# Patient Record
Sex: Male | Born: 1960 | Race: Black or African American | Hispanic: No | Marital: Single | Smoking: Former smoker
Health system: Southern US, Community
[De-identification: ages and names within clinical notes are randomized; demographics above are authoritative.]

## PROBLEM LIST (undated history)

## (undated) DIAGNOSIS — I429 Cardiomyopathy, unspecified: Secondary | ICD-10-CM

## (undated) DIAGNOSIS — I10 Essential (primary) hypertension: Secondary | ICD-10-CM

## (undated) DIAGNOSIS — F1490 Cocaine use, unspecified, uncomplicated: Secondary | ICD-10-CM

## (undated) DIAGNOSIS — N1832 Chronic kidney disease, stage 3b: Secondary | ICD-10-CM

## (undated) DIAGNOSIS — Z720 Tobacco use: Secondary | ICD-10-CM

## (undated) HISTORY — PX: BACK SURGERY: SHX140

---

## 2000-01-29 ENCOUNTER — Encounter: Payer: Self-pay | Admitting: General Surgery

## 2000-01-29 ENCOUNTER — Inpatient Hospital Stay (HOSPITAL_COMMUNITY): Admission: EM | Admit: 2000-01-29 | Discharge: 2000-01-31 | Payer: Self-pay | Admitting: Surgery

## 2005-04-24 ENCOUNTER — Emergency Department (HOSPITAL_COMMUNITY): Admission: EM | Admit: 2005-04-24 | Discharge: 2005-04-24 | Payer: Self-pay | Admitting: Emergency Medicine

## 2005-11-21 ENCOUNTER — Emergency Department (HOSPITAL_COMMUNITY): Admission: EM | Admit: 2005-11-21 | Discharge: 2005-11-22 | Payer: Self-pay | Admitting: Emergency Medicine

## 2006-10-02 ENCOUNTER — Emergency Department (HOSPITAL_COMMUNITY): Admission: EM | Admit: 2006-10-02 | Discharge: 2006-10-02 | Payer: Self-pay | Admitting: Emergency Medicine

## 2007-05-14 IMAGING — CR DG CHEST 2V
2 series · 2 of 2 positions shown · non-contrast
Comparison: none

CLINICAL DATA: Vomiting, cough, sore throat.  
 CHEST - 2 VIEW:

[view not recorded (1 of 2)]
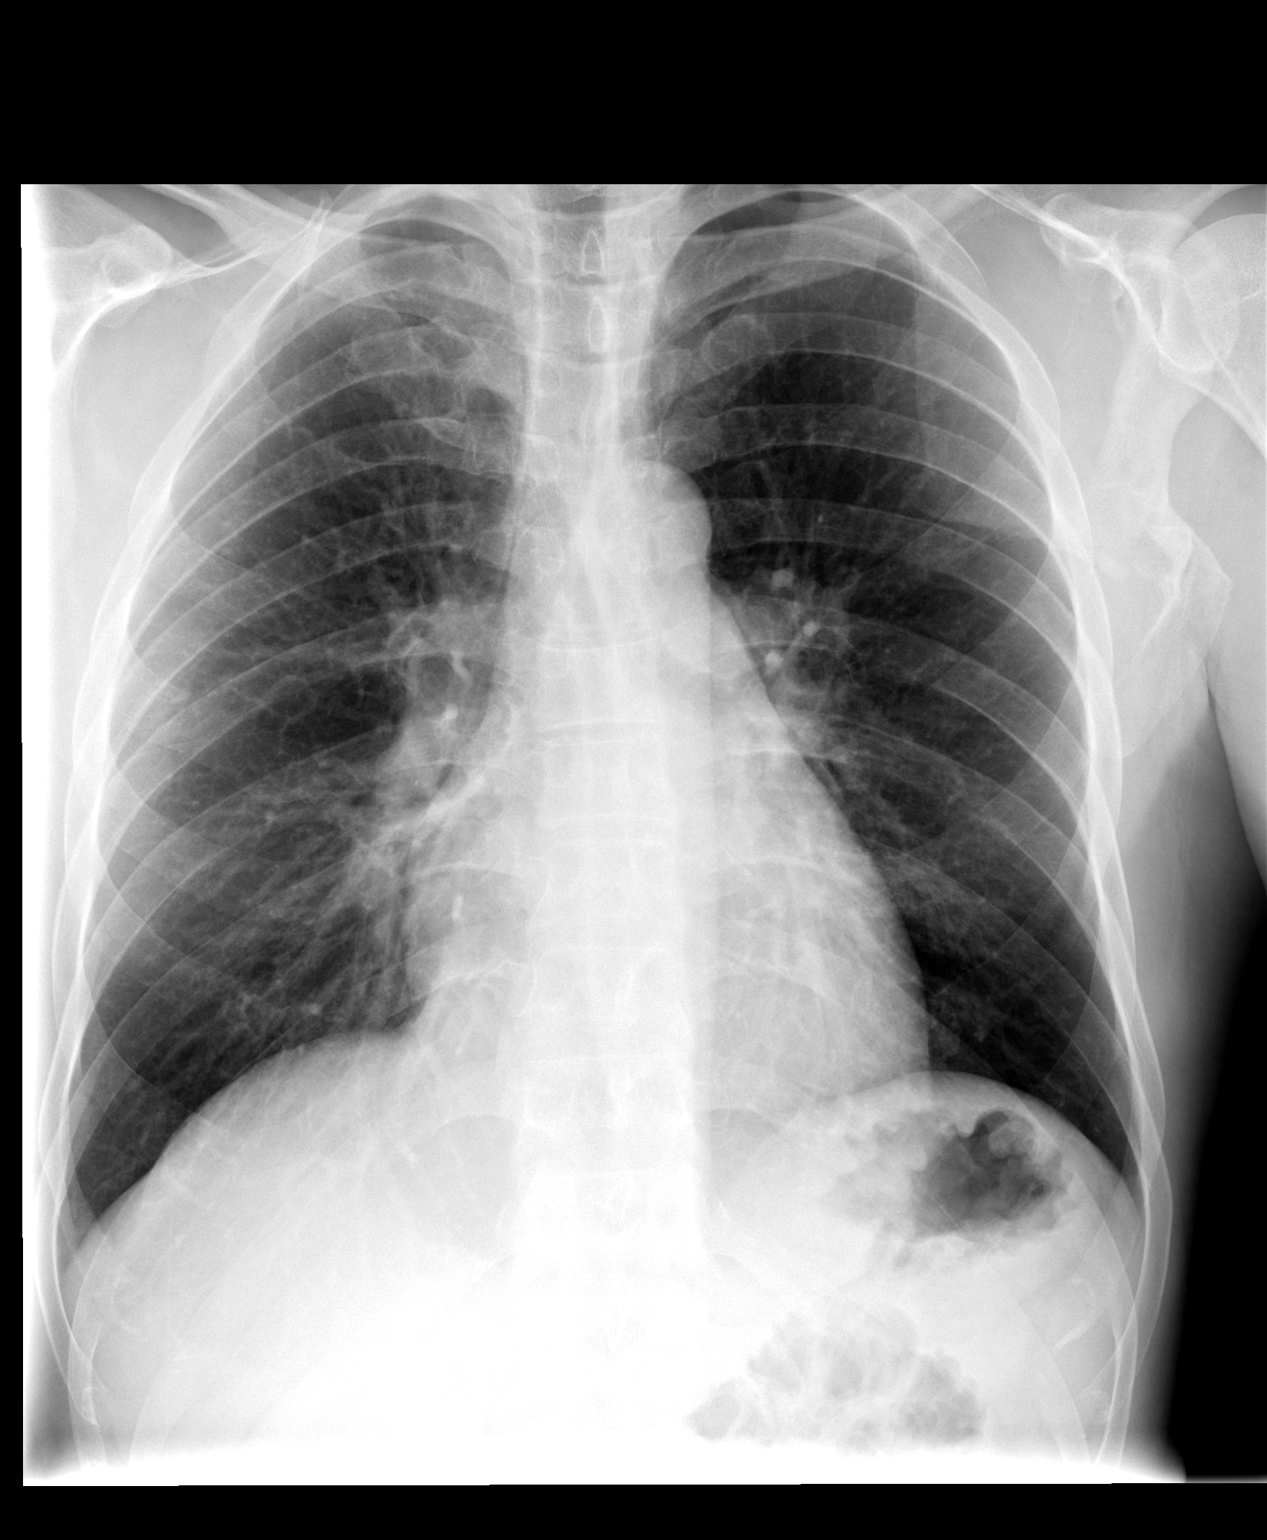

[view not recorded (2 of 2)]
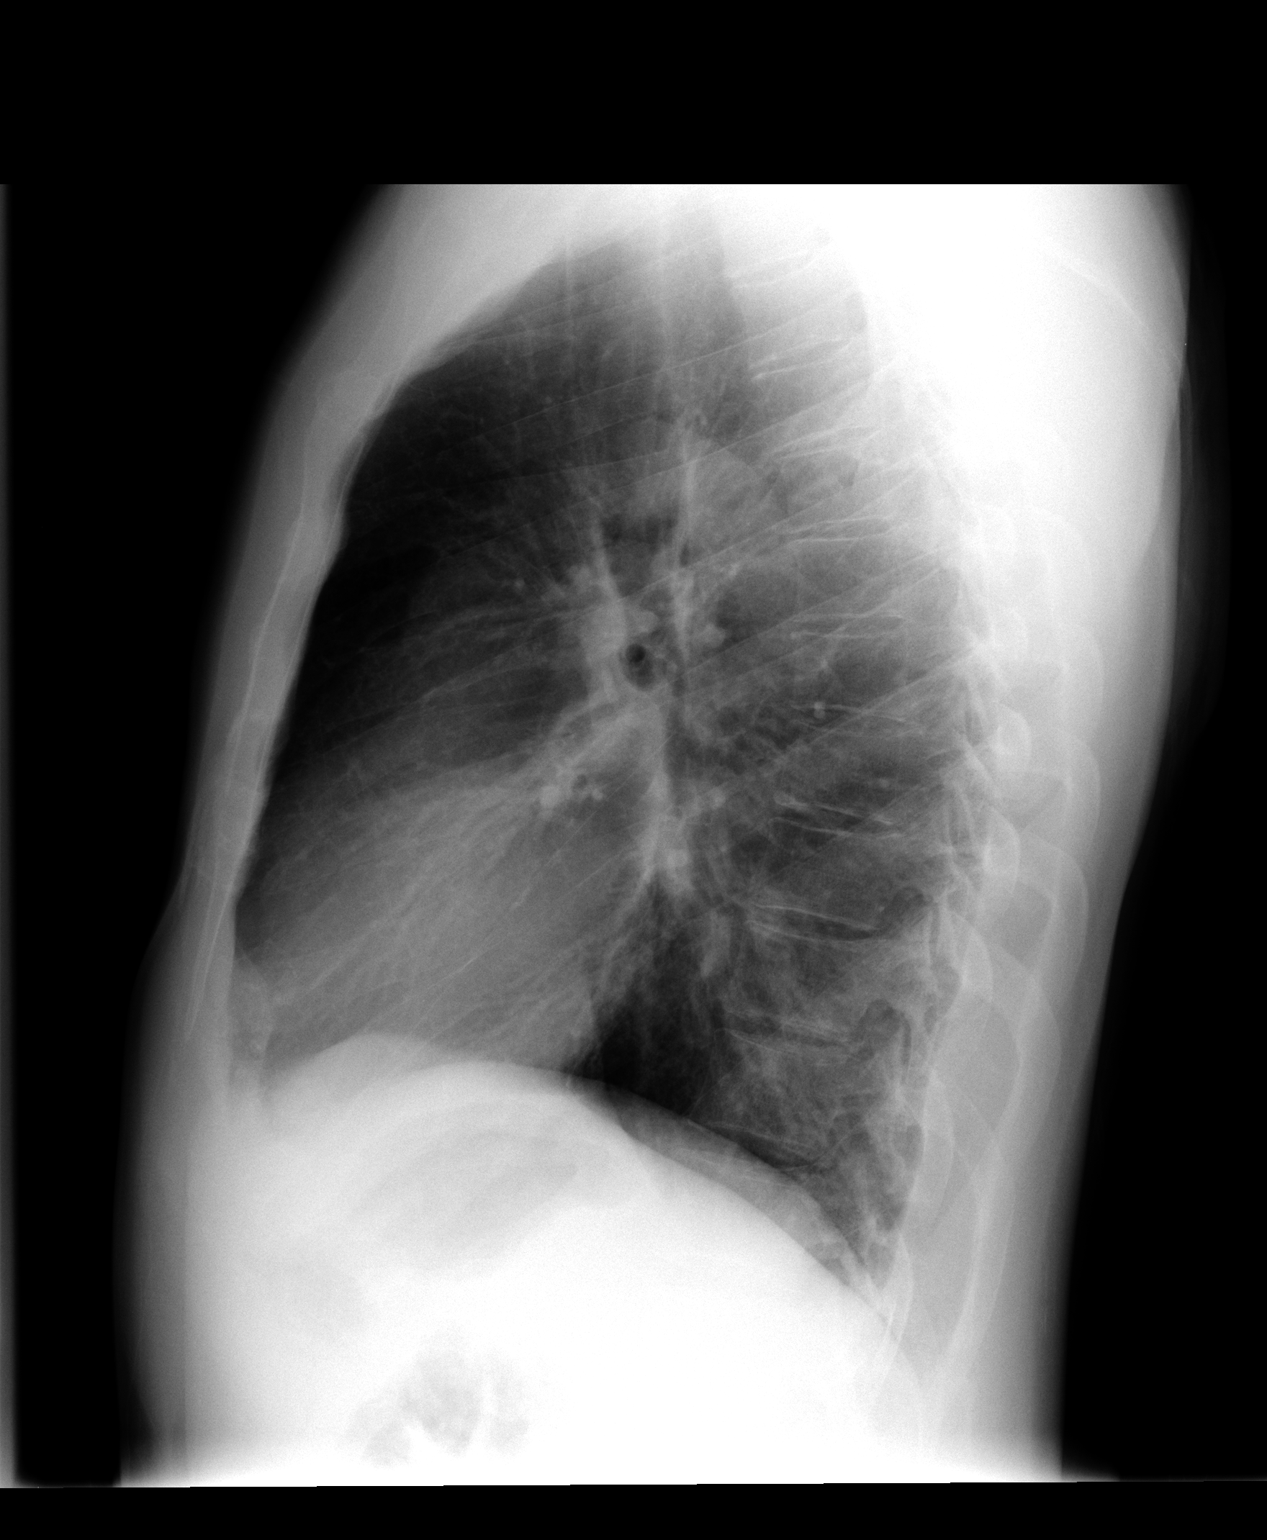

[2 of 2 positions shown; findings below may reference images not displayed]

FINDINGS: The heart size and mediastinal contours are within normal limits.  Both lungs are clear.  The visualized skeletal structures are unremarkable.
IMPRESSION: No active cardiopulmonary disease.

## 2009-06-14 ENCOUNTER — Emergency Department (HOSPITAL_COMMUNITY): Admission: EM | Admit: 2009-06-14 | Discharge: 2009-06-14 | Payer: Self-pay | Admitting: Emergency Medicine
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
## 2011-04-11 ENCOUNTER — Emergency Department (HOSPITAL_COMMUNITY): Payer: No Typology Code available for payment source

## 2011-04-11 ENCOUNTER — Encounter: Payer: Self-pay | Admitting: Emergency Medicine

## 2011-04-11 ENCOUNTER — Emergency Department (HOSPITAL_COMMUNITY)
Admission: EM | Admit: 2011-04-11 | Discharge: 2011-04-11 | Disposition: A | Discharge status: Home / Self Care | Payer: No Typology Code available for payment source | Attending: Emergency Medicine | Admitting: Emergency Medicine

## 2011-04-11 DIAGNOSIS — S63502A Unspecified sprain of left wrist, initial encounter: Secondary | ICD-10-CM

## 2011-04-11 DIAGNOSIS — S63509A Unspecified sprain of unspecified wrist, initial encounter: Secondary | ICD-10-CM | POA: Insufficient documentation

## 2011-04-11 DIAGNOSIS — M25539 Pain in unspecified wrist: Secondary | ICD-10-CM | POA: Insufficient documentation

## 2011-04-11 DIAGNOSIS — F17200 Nicotine dependence, unspecified, uncomplicated: Secondary | ICD-10-CM | POA: Insufficient documentation

## 2011-04-11 DIAGNOSIS — M545 Low back pain, unspecified: Secondary | ICD-10-CM | POA: Insufficient documentation

## 2011-04-11 DIAGNOSIS — S335XXA Sprain of ligaments of lumbar spine, initial encounter: Secondary | ICD-10-CM | POA: Insufficient documentation

## 2011-04-11 DIAGNOSIS — S39012A Strain of muscle, fascia and tendon of lower back, initial encounter: Secondary | ICD-10-CM

## 2011-04-11 MED ORDER — IBUPROFEN 800 MG PO TABS
800.0000 mg | ORAL_TABLET | Freq: Once | ORAL | Status: AC
  Administered 2011-04-11: 800 mg via ORAL
  Filled 2011-04-11: qty 1

## 2011-04-11 MED ORDER — HYDROCODONE-ACETAMINOPHEN 5-325 MG PO TABS: 2.0000 | ORAL_TABLET | ORAL | Status: AC | PRN

## 2011-04-11 MED ORDER — HYDROCODONE-ACETAMINOPHEN 5-325 MG PO TABS
2.0000 | ORAL_TABLET | Freq: Once | ORAL | Status: AC
  Administered 2011-04-11: 2 via ORAL
  Filled 2011-04-11: qty 2

## 2011-04-11 MED ORDER — DIAZEPAM 5 MG PO TABS: 5.0000 mg | ORAL_TABLET | Freq: Four times a day (QID) | ORAL | Status: AC | PRN

## 2011-04-11 MED ORDER — IBUPROFEN 800 MG PO TABS: 800.0000 mg | ORAL_TABLET | Freq: Once | ORAL | Status: AC

## 2011-04-11 MED ORDER — DIAZEPAM 5 MG PO TABS
5.0000 mg | ORAL_TABLET | Freq: Once | ORAL | Status: AC
  Administered 2011-04-11: 5 mg via ORAL
  Filled 2011-04-11: qty 1
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
## 2015-02-05 ENCOUNTER — Emergency Department (HOSPITAL_COMMUNITY): Payer: Self-pay

## 2015-02-05 ENCOUNTER — Emergency Department (HOSPITAL_COMMUNITY)
Admission: EM | Admit: 2015-02-05 | Discharge: 2015-02-05 | Disposition: A | Discharge status: Home / Self Care | Payer: Self-pay | Attending: Emergency Medicine | Admitting: Emergency Medicine

## 2015-02-05 ENCOUNTER — Encounter (HOSPITAL_COMMUNITY): Payer: Self-pay | Admitting: Emergency Medicine

## 2015-02-05 DIAGNOSIS — S6010XA Contusion of unspecified finger with damage to nail, initial encounter: Secondary | ICD-10-CM

## 2015-02-05 DIAGNOSIS — S60122A Contusion of left index finger with damage to nail, initial encounter: Secondary | ICD-10-CM | POA: Insufficient documentation

## 2015-02-05 DIAGNOSIS — Y990 Civilian activity done for income or pay: Secondary | ICD-10-CM | POA: Insufficient documentation

## 2015-02-05 DIAGNOSIS — Y9289 Other specified places as the place of occurrence of the external cause: Secondary | ICD-10-CM | POA: Insufficient documentation

## 2015-02-05 DIAGNOSIS — W231XXA Caught, crushed, jammed, or pinched between stationary objects, initial encounter: Secondary | ICD-10-CM | POA: Insufficient documentation

## 2015-02-05 DIAGNOSIS — Y9389 Activity, other specified: Secondary | ICD-10-CM | POA: Insufficient documentation

## 2015-02-05 DIAGNOSIS — Z720 Tobacco use: Secondary | ICD-10-CM | POA: Insufficient documentation

## 2015-02-05 MED ORDER — HYDROCODONE-ACETAMINOPHEN 5-325 MG PO TABS
1.0000 | ORAL_TABLET | Freq: Once | ORAL | Status: AC
  Administered 2015-02-05: 1 via ORAL
  Filled 2015-02-05: qty 1

## 2015-02-05 MED ORDER — HYDROCODONE-ACETAMINOPHEN 5-325 MG PO TABS: 1.0000 | ORAL_TABLET | ORAL | Status: DC | PRN

## 2015-02-05 NOTE — Discharge Instructions (Signed)
Subungual Hematoma °A subungual hematoma is a pocket of blood that collects under the fingernail or toenail. The pressure created by the blood under the nail can cause pain. °CAUSES  °A subungual hematoma occurs when an injury to the finger or toe causes a blood vessel beneath the nail to break. The injury can occur from a direct blow such as slamming a finger in a door. It can also occur from a repeated injury such as pressure on the foot in a shoe while running. A subungual hematoma is sometimes called runner's toe or tennis toe. °SYMPTOMS  °· Blue or dark blue skin under the nail. °· Pain or throbbing in the injured area. °DIAGNOSIS  °Your caregiver can determine whether you have a subungual hematoma based on your history and a physical exam. If your caregiver thinks you might have a broken (fractured) bone, X-rays may be taken. °TREATMENT  °Hematomas usually go away on their own over time. Your caregiver may make a hole in the nail to drain the blood. Draining the blood is painless and usually provides significant relief from pain and throbbing. The nail usually grows back normally after this procedure. In some cases, the nail may need to be removed. This is done if there is a cut under the nail that requires stitches (sutures). °HOME CARE INSTRUCTIONS  °· Put ice on the injured area. °¨ Put ice in a plastic bag. °¨ Place a towel between your skin and the bag. °¨ Leave the ice on for 15-20 minutes, 03-04 times a day for the first 1 to 2 days. °· Elevate the injured area to help decrease pain and swelling. °· If you were given a bandage, wear it for as long as directed by your caregiver. °· If part of your nail falls off, trim the remaining nail gently. This prevents the nail from catching on something and causing further injury. °· Only take over-the-counter or prescription medicines for pain, discomfort, or fever as directed by your caregiver. °SEEK IMMEDIATE MEDICAL CARE IF:  °· You have redness or swelling  around the nail. °· You have yellowish-white fluid (pus) coming from the nail. °· Your pain is not controlled with medicine. °· You have a fever. °MAKE SURE YOU: °· Understand these instructions. °· Will watch your condition. °· Will get help right away if you are not doing well or get worse. °Document Released: 06/24/2000 Document Revised: 09/19/2011 Document Reviewed: 06/15/2011 °Exit[employer]® Patient Information ©2015 [employer]. This information is not intended to replace advice given to you by your health care provider. Make sure you discuss any questions you have with your health care provider. ° °

## 2015-02-05 NOTE — ED Notes (Signed)
Crush left index finger at work on Tuesday.  Rates pain 8/10.

## 2015-02-07 NOTE — ED Provider Notes (Signed)
CSN: [id number]     Arrival date & time 02/05/15  1041 History   First MD Initiated Contact with Patient 02/05/15 1046     Chief Complaint  Patient presents with  . Finger Injury     (Consider location/radiation/quality/duration/timing/severity/associated sxs/prior Treatment) The history is provided by the patient.   Damon Williams is a 54 y.o. right handed male presenting with a crush injury to his left index finger 2 days ago when he caught it between 2 pieces of metal at work.  He reports increasing pain, deep, throbbing and persistent even though he has been using ice and elevation and delayed presenting here in the hopes it would improve.  He denies numbness in the distal finger.  He reports limited flexion and extension secondary to pain. He has had no medicines for pain relief.     History reviewed. No pertinent past medical history. Past Surgical History  Procedure Laterality Date  . Back surgery     History reviewed. No pertinent family history. History  Substance Use Topics  . Smoking status: Current Every Day Smoker -- 1.00 packs/day for 30 years    Types: Cigarettes  . Smokeless tobacco: Never Used  . Alcohol Use: No    Review of Systems  Constitutional: Negative for fever.  Musculoskeletal: Positive for joint swelling and arthralgias. Negative for myalgias.  Skin: Negative for wound.  Neurological: Negative for weakness and numbness.      Allergies  Review of patient's allergies indicates no known allergies.  Home Medications   Prior to Admission medications   Medication Sig Start Date End Date Taking? Authorizing Provider  HYDROcodone-acetaminophen (NORCO/VICODIN) 5-325 MG per tablet Take 1 tablet by mouth every 4 (four) hours as needed. 02/05/15   Burgess Amor, PA-C   BP 148/95 mmHg  Pulse 89  Temp(Src) 98 F (36.7 C) (Oral)  Resp 16  Ht  (1.727 m)  Wt 155 lb (70.308 kg)  BMI 23.57 kg/m2  SpO2 100% Physical Exam  Constitutional: He appears  well-developed and well-nourished.  HENT:  Head: Atraumatic.  Neck: Normal range of motion.  Cardiovascular:  Pulses equal bilaterally  Musculoskeletal: He exhibits tenderness.       Hands: Neurological: He is alert. He has normal strength. He displays normal reflexes. No sensory deficit.  Skin: Skin is warm and dry.  Psychiatric: He has a normal mood and affect.    ED Course  Procedures (including critical care time)   Pt's subungual hematoma was evacuated using an eye cautery stick to trephinate the nail after using alcohol swabs to clean the nail surface.  Moderate amount of blood drained from the hole with significant relief of pain for the patient.   Labs Review Labs Reviewed - No data to display  Imaging Review No results found.   EKG Interpretation None      MDM   Final diagnoses:  Subungual hematoma of digit of hand, initial encounter    Patients labs and/or radiological studies were reviewed and considered during the medical decision making and disposition process.   Imaging was reviewed, interpreted and I agree with radiologists reading.  Results were also discussed with patient. Pt was encouraged to soak his finger twice daily, wash with soapy water, then dry and bandage.  He was prescribed hydrocodone.  Advised pt that he may loose the nail as it continues to grow out but it is currently intact, expect the new nail to grow without deformity. No evidence today of permanent nail bed  injury. Advised recheck for any problems with this injury.  The patient appears reasonably screened and/or stabilized for discharge and I doubt any other medical condition or other [facility] requiring further screening, evaluation, or treatment in the ED at this time prior to discharge.      Burgess Amor, PA-C 02/07/15 2202  Rolland Porter, MD 02/13/15 [phone]

## 2018-01-01 ENCOUNTER — Emergency Department (HOSPITAL_COMMUNITY): Payer: Self-pay

## 2018-01-01 ENCOUNTER — Emergency Department (HOSPITAL_COMMUNITY)
Admission: EM | Admit: 2018-01-01 | Discharge: 2018-01-01 | Discharge status: Against Medical Advice | Payer: Self-pay | Attending: Emergency Medicine | Admitting: Emergency Medicine

## 2018-01-01 ENCOUNTER — Encounter (HOSPITAL_COMMUNITY): Payer: Self-pay

## 2018-01-01 DIAGNOSIS — Y998 Other external cause status: Secondary | ICD-10-CM | POA: Insufficient documentation

## 2018-01-01 DIAGNOSIS — S51812A Laceration without foreign body of left forearm, initial encounter: Secondary | ICD-10-CM

## 2018-01-01 DIAGNOSIS — W298XXA Contact with other powered powered hand tools and household machinery, initial encounter: Secondary | ICD-10-CM | POA: Insufficient documentation

## 2018-01-01 DIAGNOSIS — Y9389 Activity, other specified: Secondary | ICD-10-CM | POA: Insufficient documentation

## 2018-01-01 DIAGNOSIS — F17210 Nicotine dependence, cigarettes, uncomplicated: Secondary | ICD-10-CM | POA: Insufficient documentation

## 2018-01-01 DIAGNOSIS — S52602B Unspecified fracture of lower end of left ulna, initial encounter for open fracture type I or II: Secondary | ICD-10-CM | POA: Insufficient documentation

## 2018-01-01 DIAGNOSIS — Y929 Unspecified place or not applicable: Secondary | ICD-10-CM | POA: Insufficient documentation

## 2018-01-01 MED ORDER — POVIDONE-IODINE 10 % EX SOLN
CUTANEOUS | Status: DC | PRN
  Administered 2018-01-01: 1 via TOPICAL

## 2018-01-01 MED ORDER — POVIDONE-IODINE 10 % EX SOLN
CUTANEOUS | Status: AC
Start: 2018-01-01 — End: 2018-01-01
  Administered 2018-01-01: 1 via TOPICAL
  Filled 2018-01-01: qty 15

## 2018-01-01 MED ORDER — CEPHALEXIN 500 MG PO CAPS: 500.0000 mg | ORAL_CAPSULE | Freq: Once | ORAL | Status: DC

## 2018-01-01 MED ORDER — CEPHALEXIN 500 MG PO CAPS: 500.0000 mg | ORAL_CAPSULE | Freq: Four times a day (QID) | ORAL | 0 refills | Status: DC

## 2018-01-01 MED ORDER — LIDOCAINE-EPINEPHRINE-TETRACAINE (LET) SOLUTION
6.0000 mL | Freq: Once | NASAL | Status: AC
  Administered 2018-01-01: 6 mL via TOPICAL
  Filled 2018-01-01: qty 6

## 2018-01-01 NOTE — ED Triage Notes (Signed)
Pt reports left arm cut by grinder today. Pain with moving wrist. Bleeding controlled

## 2018-01-01 NOTE — ED Notes (Signed)
Pt waiting in hall, asked pt to wait in room.  Pt did return to room and then left walking out of dept, talking to self "I done told you that I'm not staying in that room"  Burgess AmorJulie Idol, PA at nurse desk and witnessed event.

## 2018-01-01 NOTE — ED Notes (Signed)
Pt refused any sticks involving a needle, tried LET, pt states it has not helped, pt then came out of room to inform nurse and PA that he was wanting to leave, Burgess AmorJulie Idol went in and was able to place steri strips to lac.  This nurse applied dsg to site and wrist/forearm splint to left arm.

## 2018-01-01 NOTE — ED Provider Notes (Signed)
[facility] EMERGENCY DEPARTMENT Provider Note   CSN: [id number] Arrival date & time: 01/01/18  1334     History   Chief Complaint Chief Complaint  Patient presents with  . Extremity Laceration    HPI Damon Williams is a 57 y.o. right handed male presenting with laceration occurring 2 hours before arrival to his left lateral mid forearm while he was trying to cut plywood using a grinder tool.  He reports pain with wrist movement which is better at rest.  He denies numbness or weakness in his fingers, hand or wrist.  He has had no treatment prior to arrival except for direct pressure to control bleeding.  He is unsure of the date of his last tetanus shot.  The history is provided by the patient.    History reviewed. No pertinent past medical history.  There are no active problems to display for this patient.   Past Surgical History:  Procedure Laterality Date  . BACK SURGERY          Home Medications    Prior to Admission medications   Medication Sig Start Date End Date Taking? Authorizing Provider  cephALEXin (KEFLEX) 500 MG capsule Take 1 capsule (500 mg total) by mouth 4 (four) times daily. 01/01/18   Burgess Amor, PA-C  HYDROcodone-acetaminophen (NORCO/VICODIN) 5-325 MG per tablet Take 1 tablet by mouth every 4 (four) hours as needed. 02/05/15   Burgess Amor, PA-C    Family History No family history on file.  Social History Social History   Tobacco Use  . Smoking status: Current Every Day Smoker    Packs/day: 1.00    Years: 30.00    Pack years: 30.00    Types: Cigarettes  . Smokeless tobacco: Never Used  Substance Use Topics  . Alcohol use: No  . Drug use: No     Allergies   Patient has no known allergies.   Review of Systems Review of Systems  Constitutional: Negative.  Negative for fever.  Gastrointestinal: Negative.   Musculoskeletal: Positive for arthralgias. Negative for joint swelling and myalgias.  Skin: Positive for wound.  Neurological:  Negative for weakness and numbness.     Physical Exam Updated Vital Signs BP (!) 159/103 (BP Location: Right Arm)   Pulse 97   Temp 98.6 F (37 C) (Oral)   Resp 16   Wt 70.3 kg (155 lb)   SpO2 99%   BMI 23.57 kg/m   Physical Exam  Constitutional: He is oriented to person, place, and time. He appears well-developed and well-nourished.  HENT:  Head: Normocephalic.  Cardiovascular: Normal rate.  Pulmonary/Chest: Effort normal.  Musculoskeletal: Normal range of motion. He exhibits tenderness. He exhibits no deformity.  FROM of left fingers and wrist but with increased pain with wrist dorsiflexion and inversion.   Neurological: He is alert and oriented to person, place, and time. No sensory deficit.  Skin: Laceration noted.  6 cm subcutaneous laceration, irregular, dorsal lateral left forearm. Tendon edge visualized, suspect partial but not full injury, no palpable foreign body. Distal sensation intact with less than 2 sec cap refill in fingertips.  Nursing note and vitals reviewed.    ED Treatments / Results  Labs (all labs ordered are listed, but only abnormal results are displayed) Labs Reviewed - No data to display  EKG None  Radiology Dg Wrist Complete Left  Result Date: 01/01/2018 CLINICAL DATA:  Laceration. EXAM: LEFT WRIST - COMPLETE 3+ VIEW COMPARISON:  None. FINDINGS: There is a laceration along the  ulnar aspect of the distal forearm. There is a defect in the adjacent ulna consistent with fracture. High attenuation material in the adjacent soft tissues is at least partially explained by fracture fragments but it would be difficult to exclude foreign bodies as well. No dislocation. No other fractures noted. No other acute abnormalities. IMPRESSION: 1. There is a laceration to the distal forearm adjacent to the ulna. There is deformity of the underlying ulna consistent with fracture with adjacent fracture fragments. There is high attenuation in the adjacent soft tissues  which is at least partially explained by fracture fragments but it would be difficult to exclude foreign bodies as well. Electronically Signed   By: Gerome Samavid  Williams III M.D   On: 01/01/2018 14:43    Procedures Procedures (including critical care time)  Medications Ordered in ED Medications  lidocaine-EPINEPHrine-tetracaine (LET) solution (6 mLs Topical Given 01/01/18 1431)     Initial Impression / Assessment and Plan / ED Course  I have reviewed the triage vital signs and the nursing notes.  Pertinent labs & imaging results that were available during my care of the patient were reviewed by me and considered in my medical decision making (see chart for details).    Imaging reviewed and discussed with patient, including the significance of an open fracture and the inability to rule out foreign body, also discussed suspected tendon injury and its significance.  Pt refuses any closure or other care of the wound.  He states he is only here because family made him come in.  He states his wife died here last year and he refuses to stay any longer here and refuses any needle pokes or sutures.  Discussed that he needs the wound to be flushed out and explored looking for foreign body. Pt refuses, including refusing to have his tetanus updated.  Discussed that if he will need to see orthopedics for this care and referral would be given as pt endorsed again he was not staying any longer.  Outlined risks including permanent disability of the forearm of hand,  infection of the extremity and/or life threatening sepsis, also risk of life threatening tetanus.  Again pt refused.  He was willing to allow cleaning of the wound after numbing with LET.  This was accomplished using betadine scrub and copious flushing with saline.  He also allowed sterile strips to better approximate wound edges, but were loosely applied. Dressing and wrist splint provided. Stressed need for f/u with ortho. Prior to receiving discharge  information or antibiotic prescriptions, or any information regarding follow up care, pt left ama.   Final Clinical Impressions(s) / ED Diagnoses   Final diagnoses:  Type I or II open fracture of distal end of left ulna, unspecified fracture morphology, initial encounter  Laceration of left forearm, initial encounter    ED Discharge Orders        Ordered    cephALEXin (KEFLEX) 500 MG capsule  4 times daily     01/01/18 1526       Burgess Amordol, Shawan Corella, PA-C 01/01/18 2124    Samuel JesterMcManus, Kathleen, DO 01/06/18 1333

## 2018-01-01 NOTE — ED Notes (Signed)
Pt left without getting prescription for antibiotic and d/c instructions.

## 2018-01-02 ENCOUNTER — Encounter (HOSPITAL_COMMUNITY): Payer: Self-pay

## 2018-01-02 ENCOUNTER — Emergency Department (HOSPITAL_COMMUNITY)
Admission: EM | Admit: 2018-01-02 | Discharge: 2018-01-02 | Disposition: A | Discharge status: Home / Self Care | Payer: Self-pay | Attending: Emergency Medicine | Admitting: Emergency Medicine

## 2018-01-02 ENCOUNTER — Other Ambulatory Visit: Payer: Self-pay

## 2018-01-02 DIAGNOSIS — S61512D Laceration without foreign body of left wrist, subsequent encounter: Secondary | ICD-10-CM | POA: Insufficient documentation

## 2018-01-02 DIAGNOSIS — F17210 Nicotine dependence, cigarettes, uncomplicated: Secondary | ICD-10-CM | POA: Insufficient documentation

## 2018-01-02 DIAGNOSIS — W312XXD Contact with powered woodworking and forming machines, subsequent encounter: Secondary | ICD-10-CM | POA: Insufficient documentation

## 2018-01-02 DIAGNOSIS — Z5189 Encounter for other specified aftercare: Secondary | ICD-10-CM

## 2018-01-02 MED ORDER — TRAMADOL HCL 50 MG PO TABS
50.0000 mg | ORAL_TABLET | Freq: Once | ORAL | Status: AC
  Administered 2018-01-02: 50 mg via ORAL
  Filled 2018-01-02: qty 1

## 2018-01-02 MED ORDER — TRAMADOL HCL 50 MG PO TABS: 50.0000 mg | ORAL_TABLET | Freq: Four times a day (QID) | ORAL | 0 refills | Status: DC | PRN

## 2018-01-02 MED ORDER — CEPHALEXIN 500 MG PO CAPS
1000.0000 mg | ORAL_CAPSULE | Freq: Once | ORAL | Status: AC
  Administered 2018-01-02: 1000 mg via ORAL
  Filled 2018-01-02: qty 2

## 2018-01-02 MED ORDER — CEPHALEXIN 500 MG PO CAPS: 500.0000 mg | ORAL_CAPSULE | Freq: Four times a day (QID) | ORAL | 0 refills | Status: DC

## 2018-01-02 MED ORDER — TETANUS-DIPHTH-ACELL PERTUSSIS 5-2.5-18.5 LF-MCG/0.5 IM SUSP
0.5000 mL | Freq: Once | INTRAMUSCULAR | Status: AC
  Administered 2018-01-02: 0.5 mL via INTRAMUSCULAR
  Filled 2018-01-02: qty 0.5

## 2018-01-02 NOTE — ED Provider Notes (Signed)
[facility] EMERGENCY DEPARTMENT Provider Note   CSN: [id number] Arrival date & time: 01/02/18  [id number]     History   Chief Complaint Chief Complaint  Patient presents with  . Wound Check    HPI Damon Williams is a 57 y.o. male.  Patient states left [facility] last night without papers/prescription and that he wants prescription for pain med and antibiotic. Pt s/p injury to left wrist yesterday with power saw blade, suffering ulna injury/fx, and laceration. Wound has been closed. Pt denies numbness/weakness. No fever or chills. Denies other pain or injury. Unsure of last tetanus.   The history is provided by the patient.  Wound Check     History reviewed. No pertinent past medical history.  There are no active problems to display for this patient.   Past Surgical History:  Procedure Laterality Date  . BACK SURGERY          Home Medications    Prior to Admission medications   Medication Sig Start Date End Date Taking? Authorizing Provider  cephALEXin (KEFLEX) 500 MG capsule Take 1 capsule (500 mg total) by mouth 4 (four) times daily. Patient not taking: Reported on 01/02/2018 01/01/18   Burgess AmorIdol, Julie, PA-C    Family History No family history on file.  Social History Social History   Tobacco Use  . Smoking status: Current Every Day Smoker    Packs/day: 1.00    Years: 30.00    Pack years: 30.00    Types: Cigarettes  . Smokeless tobacco: Never Used  Substance Use Topics  . Alcohol use: No  . Drug use: No     Allergies   Patient has no known allergies.   Review of Systems Review of Systems  Constitutional: Negative for chills and fever.  Skin: Positive for wound.  Neurological: Negative for weakness and numbness.     Physical Exam Updated Vital Signs BP (!) 139/111 (BP Location: Right Arm)   Pulse (!) 104   Temp 98.9 F (37.2 C)   Resp 18   Ht 1.702 m (5\' 7" )   Wt 68 kg (150 lb)   SpO2 100%   BMI 23.49 kg/m   Physical Exam  Constitutional: He  appears well-developed and well-nourished.  HENT:  Head: Atraumatic.  Eyes: Conjunctivae are normal.  Neck: Neck supple. No tracheal deviation present.  Cardiovascular: Normal rate and intact distal pulses.  Pulmonary/Chest: Effort normal. No accessory muscle usage. No respiratory distress.  Musculoskeletal:  Closed wound to left wrist. No purulent drainage from wound. Mild sts to area. Radial pulse 2+.   Neurological: He is alert.  RUE/hand rad/med/uln fxn motor/sens grossly intact.   Skin: Skin is warm and dry. No rash noted.  Psychiatric: He has a normal mood and affect.  Nursing note and vitals reviewed.    ED Treatments / Results  Labs (all labs ordered are listed, but only abnormal results are displayed) Labs Reviewed - No data to display  EKG None  Radiology Dg Wrist Complete Left  Result Date: 01/01/2018 CLINICAL DATA:  Laceration. EXAM: LEFT WRIST - COMPLETE 3+ VIEW COMPARISON:  None. FINDINGS: There is a laceration along the ulnar aspect of the distal forearm. There is a defect in the adjacent ulna consistent with fracture. High attenuation material in the adjacent soft tissues is at least partially explained by fracture fragments but it would be difficult to exclude foreign bodies as well. No dislocation. No other fractures noted. No other acute abnormalities. IMPRESSION: 1. There is a laceration  to the distal forearm adjacent to the ulna. There is deformity of the underlying ulna consistent with fracture with adjacent fracture fragments. There is high attenuation in the adjacent soft tissues which is at least partially explained by fracture fragments but it would be difficult to exclude foreign bodies as well. Electronically Signed   By: Gerome Sam III M.D   On: 01/01/2018 14:43    Procedures Procedures (including critical care time)  Medications Ordered in ED Medications  traMADol (ULTRAM) tablet 50 mg (has no administration in time range)  cephALEXin (KEFLEX)  capsule 1,000 mg (has no administration in time range)     Initial Impression / Assessment and Plan / ED Course  I have reviewed the triage vital signs and the nursing notes.  Pertinent labs & imaging results that were available during my care of the patient were reviewed by me and considered in my medical decision making (see chart for details).  Discussed plan for tetanus im, and abx.   Pt will not permit/refuses iv/iv abx.   Pt states will accept po meds but willing to have iv/iv meds/im meds. Discussed risks of tetanus and infection, including severe pain, infection of bone/arm, progressive infection/death - pt unwilling to accept iv/im meds.   Pt states family member can pick him up. Ultram po. Keflex 1 gm po.  Pt requests d/c, refusing other tx.     Final Clinical Impressions(s) / ED Diagnoses   Final diagnoses:  None    ED Discharge Orders    None       Cathren Laine, MD 01/03/18 [id number]
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
## 2018-01-02 NOTE — Discharge Instructions (Addendum)
It was our pleasure to provide your ER care today - we hope that you feel better.  Take antibiotic as prescribed.  Take motrin or aleve as need for pain. You may also take ultram as need for pain - no driving for the next 6 hours, or when taking ultram.   Elevate arm/hand. Wear splint. Keep wound very clean/dry.  Follow up with orthopedist in the next couple days for recheck - see referral - call office to arrange appointment time.   Return to ER if worse, new symptoms, infection of wound, fevers, intractable pain, other concern.

## 2019-10-09 ENCOUNTER — Emergency Department (HOSPITAL_COMMUNITY): Payer: Self-pay

## 2019-10-09 ENCOUNTER — Encounter (HOSPITAL_COMMUNITY): Payer: Self-pay | Admitting: Emergency Medicine

## 2019-10-09 ENCOUNTER — Other Ambulatory Visit: Payer: Self-pay

## 2019-10-09 ENCOUNTER — Emergency Department (HOSPITAL_COMMUNITY)
Admission: EM | Admit: 2019-10-09 | Discharge: 2019-10-09 | Disposition: A | Discharge status: Home / Self Care | Payer: Self-pay | Attending: Emergency Medicine | Admitting: Emergency Medicine

## 2019-10-09 DIAGNOSIS — F17210 Nicotine dependence, cigarettes, uncomplicated: Secondary | ICD-10-CM | POA: Insufficient documentation

## 2019-10-09 DIAGNOSIS — Z79899 Other long term (current) drug therapy: Secondary | ICD-10-CM | POA: Insufficient documentation

## 2019-10-09 DIAGNOSIS — M545 Low back pain, unspecified: Secondary | ICD-10-CM

## 2019-10-09 DIAGNOSIS — M1711 Unilateral primary osteoarthritis, right knee: Secondary | ICD-10-CM | POA: Insufficient documentation

## 2019-10-09 MED ORDER — HYDROCODONE-ACETAMINOPHEN 5-325 MG PO TABS
1.0000 | ORAL_TABLET | Freq: Once | ORAL | Status: AC
  Administered 2019-10-09: 23:00:00 1 via ORAL
  Filled 2019-10-09: qty 1

## 2019-10-09 MED ORDER — METHOCARBAMOL 500 MG PO TABS: 500.0000 mg | ORAL_TABLET | Freq: Three times a day (TID) | ORAL | 0 refills | Status: DC

## 2019-10-09 MED ORDER — PREDNISONE 10 MG PO TABS: ORAL_TABLET | ORAL | 0 refills | Status: DC

## 2019-10-09 MED ORDER — HYDROCODONE-ACETAMINOPHEN 5-325 MG PO TABS: ORAL_TABLET | ORAL | 0 refills | Status: DC

## 2019-10-09 MED ORDER — CYCLOBENZAPRINE HCL 10 MG PO TABS
10.0000 mg | ORAL_TABLET | Freq: Once | ORAL | Status: AC
  Administered 2019-10-09: 10 mg via ORAL
  Filled 2019-10-09: qty 1

## 2019-10-09 NOTE — ED Provider Notes (Signed)
[facility] EMERGENCY DEPARTMENT Provider Note   CSN: [id number] Arrival date & time: 10/09/19  1709     History Chief Complaint  Patient presents with  . Back Pain    Damon Williams is a 59 y.o. male.  HPI      Damon Williams is a 59 y.o. male who presents to the Emergency Department complaining of right-sided low back pain and right knee pain.  Right knee pain is been persistent for 2 months and gradually worsening.  Low back pain 2 days ago while working underneath a house.  He reports difficulty standing erect, but pain gradually improves after walking.  No known injury.  He denies abdominal pain, numbness or weakness of his lower extremities, urine or bowel changes, fever or chills.  Pain improves somewhat at rest.  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    History reviewed. No pertinent past medical history.  There are no problems to display for this patient.   Past Surgical History:  Procedure Laterality Date  . BACK SURGERY         History reviewed. No pertinent family history.  Social History   Tobacco Use  . Smoking status: Current Every Day Smoker    Packs/day: 1.00    Years: 30.00    Pack years: 30.00    Types: Cigarettes  . Smokeless tobacco: Never Used  Substance Use Topics  . Alcohol use: No  . Drug use: No    Home Medications Prior to Admission medications   Medication Sig Start Date End Date Taking? Authorizing Provider  cephALEXin (KEFLEX) 500 MG capsule Take 1 capsule (500 mg total) by mouth 4 (four) times daily. Patient not taking: Reported on 01/02/2018 01/01/18   Burgess Amor, PA-C  cephALEXin (KEFLEX) 500 MG capsule Take 1 capsule (500 mg total) by mouth 4 (four) times daily. 01/02/18   Cathren Laine, MD  traMADol (ULTRAM) 50 MG tablet Take 1 tablet (50 mg total) by mouth every 6 (six) hours as needed. 01/02/18   Cathren Laine, MD    Allergies    Patient has no known  allergies.  Review of Systems   Review of Systems  Constitutional: Negative for fever.  Respiratory: Negative for shortness of breath.   Cardiovascular: Negative for chest pain.  Gastrointestinal: Negative for abdominal pain, nausea and vomiting.  Genitourinary: Negative for decreased urine volume, difficulty urinating, dysuria and flank pain.  Musculoskeletal: Positive for arthralgias (right knee pain) and back pain. Negative for joint swelling.  Skin: Negative for rash and wound.  Neurological: Negative for weakness, numbness and headaches.    Physical Exam Updated Vital Signs BP (!) 155/86 (BP Location: Left Arm)   Pulse 91   Temp 98.4 F (36.9 C) (Oral)   Resp 20   Ht 5' 7.5" (1.715 m)   Wt 79.4 kg   SpO2 100%   BMI 27.00 kg/m   Physical Exam Vitals and nursing note reviewed.  Constitutional:      General: He is not in acute distress.    Appearance: He is well-developed.  HENT:     Head: Normocephalic and atraumatic.  Cardiovascular:     Rate and Rhythm: Normal rate and regular rhythm.     Pulses: Normal pulses.     Comments: DP pulses are strong and palpable bilaterally Pulmonary:     Effort: Pulmonary effort is normal. No respiratory distress.     Breath sounds: Normal  breath sounds.  Abdominal:     General: There is no distension.     Palpations: Abdomen is soft.     Tenderness: There is no abdominal tenderness.  Musculoskeletal:        General: Tenderness present. No swelling or deformity.     Cervical back: Normal range of motion and neck supple.     Lumbar back: Tenderness present. No swelling, deformity or lacerations. Normal range of motion.     Comments: ttp of the lower lumbar spine and right lumbar paraspinal muscles and SI joint space.  Pt has 5/5 strength against resistance of bilateral lower extremities.  ttp of the medial aspect of the right knee.  Pain on valgus and varus stress.  No erythema, effusion or excessive warmth of the joint   Skin:     General: Skin is warm and dry.     Capillary Refill: Capillary refill takes less than 2 seconds.     Findings: No rash.  Neurological:     Mental Status: He is alert and oriented to person, place, and time.     Sensory: No sensory deficit.     Motor: No abnormal muscle tone.     Coordination: Coordination normal.     Gait: Gait normal.     Deep Tendon Reflexes:     Reflex Scores:      Patellar reflexes are 2+ on the right side and 2+ on the left side.      Achilles reflexes are 2+ on the right side and 2+ on the left side.    ED Results / Procedures / Treatments   Labs (all labs ordered are listed, but only abnormal results are displayed) Labs Reviewed - No data to display  EKG None  Radiology No results found.  Procedures Procedures (including critical care time)  Medications Ordered in ED Medications - No data to display  ED Course  I have reviewed the triage vital signs and the nursing notes.  Pertinent labs & imaging results that were available during my care of the patient were reviewed by me and considered in my medical decision making (see chart for details).    MDM Rules/Calculators/A&P                      Pt with low back pain and right knee pain.  Back pain likely musculoskeletal and plain film of the knee shows OA.  No concerning sx's for septic joint or cauda equina.  Pt is ambulatory with steady gait.  Agrees to orthopedic f/u  Knee brace applied.     Final Clinical Impression(s) / ED Diagnoses Final diagnoses:  Acute right-sided low back pain without sciatica  Osteoarthritis of right knee, unspecified osteoarthritis type    Rx / DC Orders ED Discharge Orders    None       Kem Parkinson, PA-C 10/11/19 1148    Milton Ferguson, MD 10/11/19 1541

## 2019-10-09 NOTE — ED Triage Notes (Signed)
Patient was working under crawl space of house and is currently having lower back pain, radiating down right leg.

## 2019-10-09 NOTE — Discharge Instructions (Addendum)
Alternate ice and heat to your lower back.  You may apply heat on and off to your knee.  Wear the brace for support when standing or walking.  Call Dr. Mort Sawyers office to arrange a follow-up appointment if needed.

## 2021-09-20 ENCOUNTER — Emergency Department (HOSPITAL_COMMUNITY)
Admission: EM | Admit: 2021-09-20 | Discharge: 2021-09-20 | Disposition: A | Discharge status: Home / Self Care | Payer: Self-pay | Attending: Emergency Medicine | Admitting: Emergency Medicine

## 2021-09-20 ENCOUNTER — Emergency Department (HOSPITAL_COMMUNITY): Payer: Self-pay

## 2021-09-20 ENCOUNTER — Encounter (HOSPITAL_COMMUNITY): Payer: Self-pay | Admitting: *Deleted

## 2021-09-20 DIAGNOSIS — S5002XA Contusion of left elbow, initial encounter: Secondary | ICD-10-CM

## 2021-09-20 DIAGNOSIS — S59902A Unspecified injury of left elbow, initial encounter: Secondary | ICD-10-CM | POA: Diagnosis present

## 2021-09-20 DIAGNOSIS — Z79899 Other long term (current) drug therapy: Secondary | ICD-10-CM | POA: Insufficient documentation

## 2021-09-20 DIAGNOSIS — M25561 Pain in right knee: Secondary | ICD-10-CM

## 2021-09-20 DIAGNOSIS — Y92410 Unspecified street and highway as the place of occurrence of the external cause: Secondary | ICD-10-CM | POA: Diagnosis not present

## 2021-09-20 MED ORDER — METHOCARBAMOL 500 MG PO TABS: 500.0000 mg | ORAL_TABLET | Freq: Three times a day (TID) | ORAL | 0 refills | Status: DC

## 2021-09-20 MED ORDER — HYDROCODONE-ACETAMINOPHEN 5-325 MG PO TABS: ORAL_TABLET | ORAL | 0 refills | Status: DC
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
## 2021-09-20 NOTE — Discharge Instructions (Signed)
Continue to apply ice packs on and off to your elbow.  Wear your knee brace as needed when walking or standing.  Please contact the orthopedic provider listed to arrange a follow-up appointment.  Return to the emergency department for any new or worsening symptoms. ?
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
## 2022-02-14 ENCOUNTER — Other Ambulatory Visit: Payer: Self-pay

## 2022-02-14 ENCOUNTER — Emergency Department (HOSPITAL_COMMUNITY)
Admission: EM | Admit: 2022-02-14 | Discharge: 2022-02-14 | Disposition: A | Discharge status: Home / Self Care | Payer: Self-pay | Attending: Emergency Medicine | Admitting: Emergency Medicine

## 2022-02-14 ENCOUNTER — Encounter (HOSPITAL_COMMUNITY): Payer: Self-pay

## 2022-02-14 DIAGNOSIS — R200 Anesthesia of skin: Secondary | ICD-10-CM | POA: Insufficient documentation

## 2022-02-14 DIAGNOSIS — Z5321 Procedure and treatment not carried out due to patient leaving prior to being seen by health care provider: Secondary | ICD-10-CM | POA: Insufficient documentation

## 2022-02-14 LAB — BASIC METABOLIC PANEL
BUN: 28 mg/dL — ABNORMAL HIGH (ref 6–20)
CO2: 27 mmol/L (ref 22–32)
Calcium: 8.2 mg/dL — ABNORMAL LOW (ref 8.9–10.3)
Chloride: 115 mmol/L — ABNORMAL HIGH (ref 98–111)
Creatinine, Ser: 1.96 mg/dL — ABNORMAL HIGH (ref 0.61–1.24)
GFR, Estimated: 39 mL/min — ABNORMAL LOW (ref >60)
Glucose, Bld: 86 mg/dL (ref 70–99)
Potassium: 4.4 mmol/L (ref 3.5–5.1)
Sodium: 140 mmol/L (ref 135–145)

## 2022-02-14 LAB — CBC
HCT: 39.6 % (ref 39.0–52.0)
Hemoglobin: 13.1 g/dL (ref 13.0–17.0)
MCH: 31.4 pg (ref 26.0–34.0)
MCHC: 33.1 g/dL (ref 30.0–36.0)
MCV: 95 fL (ref 80.0–100.0)
Platelets: 230 10*3/uL (ref 150–400)
RBC: 4.17 MIL/uL — ABNORMAL LOW (ref 4.22–5.81)
RDW: 13.9 % (ref 11.5–15.5)
WBC: 5 10*3/uL (ref 4.0–10.5)
nRBC: 0 % (ref 0.0–0.2)

## 2022-02-14 NOTE — ED Triage Notes (Signed)
Right numbness started today. Numbness has improved expect 2 finger of right hand. Pt was using a drill when arm went numb.

## 2022-10-31 ENCOUNTER — Emergency Department (HOSPITAL_COMMUNITY)
Admission: EM | Admit: 2022-10-31 | Discharge: 2022-10-31 | Discharge status: Against Medical Advice | Payer: 59 | Attending: Emergency Medicine | Admitting: Emergency Medicine

## 2022-10-31 ENCOUNTER — Encounter (HOSPITAL_COMMUNITY): Payer: Self-pay

## 2022-10-31 ENCOUNTER — Other Ambulatory Visit: Payer: Self-pay

## 2022-10-31 ENCOUNTER — Emergency Department (HOSPITAL_COMMUNITY): Payer: 59

## 2022-10-31 DIAGNOSIS — R0602 Shortness of breath: Secondary | ICD-10-CM | POA: Insufficient documentation

## 2022-10-31 DIAGNOSIS — Z5321 Procedure and treatment not carried out due to patient leaving prior to being seen by health care provider: Secondary | ICD-10-CM | POA: Insufficient documentation

## 2022-10-31 MED ORDER — ALBUTEROL SULFATE HFA 108 (90 BASE) MCG/ACT IN AERS: 2.0000 | INHALATION_SPRAY | RESPIRATORY_TRACT | Status: DC | PRN

## 2022-10-31 NOTE — ED Triage Notes (Signed)
Pt reports shortness of breath x 2-3 days, it wakes him up at night.  Pt denies any cough or leg swelling.

## 2022-11-04 ENCOUNTER — Other Ambulatory Visit: Payer: Self-pay

## 2022-11-04 ENCOUNTER — Inpatient Hospital Stay (HOSPITAL_COMMUNITY): Payer: 59

## 2022-11-04 ENCOUNTER — Inpatient Hospital Stay (HOSPITAL_COMMUNITY)
Admission: EM | Admit: 2022-11-04 | Discharge: 2022-11-08 | DRG: 682 | Disposition: A | Discharge status: Home / Self Care | Payer: 59 | Attending: Family Medicine | Admitting: Family Medicine

## 2022-11-04 ENCOUNTER — Encounter (HOSPITAL_COMMUNITY): Payer: Self-pay | Admitting: *Deleted

## 2022-11-04 ENCOUNTER — Emergency Department (HOSPITAL_COMMUNITY): Payer: 59

## 2022-11-04 DIAGNOSIS — N189 Chronic kidney disease, unspecified: Secondary | ICD-10-CM | POA: Diagnosis present

## 2022-11-04 DIAGNOSIS — N281 Cyst of kidney, acquired: Secondary | ICD-10-CM | POA: Diagnosis present

## 2022-11-04 DIAGNOSIS — I160 Hypertensive urgency: Secondary | ICD-10-CM | POA: Diagnosis present

## 2022-11-04 DIAGNOSIS — N179 Acute kidney failure, unspecified: Principal | ICD-10-CM

## 2022-11-04 DIAGNOSIS — I5021 Acute systolic (congestive) heart failure: Secondary | ICD-10-CM | POA: Diagnosis not present

## 2022-11-04 DIAGNOSIS — R350 Frequency of micturition: Secondary | ICD-10-CM | POA: Diagnosis present

## 2022-11-04 DIAGNOSIS — D631 Anemia in chronic kidney disease: Secondary | ICD-10-CM

## 2022-11-04 DIAGNOSIS — I77810 Thoracic aortic ectasia: Secondary | ICD-10-CM | POA: Diagnosis present

## 2022-11-04 DIAGNOSIS — F17200 Nicotine dependence, unspecified, uncomplicated: Secondary | ICD-10-CM

## 2022-11-04 DIAGNOSIS — I429 Cardiomyopathy, unspecified: Secondary | ICD-10-CM | POA: Diagnosis not present

## 2022-11-04 DIAGNOSIS — E8809 Other disorders of plasma-protein metabolism, not elsewhere classified: Secondary | ICD-10-CM | POA: Diagnosis present

## 2022-11-04 DIAGNOSIS — R54 Age-related physical debility: Secondary | ICD-10-CM | POA: Diagnosis present

## 2022-11-04 DIAGNOSIS — I472 Ventricular tachycardia, unspecified: Secondary | ICD-10-CM | POA: Diagnosis present

## 2022-11-04 DIAGNOSIS — D649 Anemia, unspecified: Secondary | ICD-10-CM | POA: Diagnosis not present

## 2022-11-04 DIAGNOSIS — Z79899 Other long term (current) drug therapy: Secondary | ICD-10-CM

## 2022-11-04 DIAGNOSIS — I5030 Unspecified diastolic (congestive) heart failure: Secondary | ICD-10-CM

## 2022-11-04 DIAGNOSIS — N1832 Chronic kidney disease, stage 3b: Secondary | ICD-10-CM | POA: Diagnosis not present

## 2022-11-04 DIAGNOSIS — I10 Essential (primary) hypertension: Secondary | ICD-10-CM | POA: Diagnosis not present

## 2022-11-04 DIAGNOSIS — N2581 Secondary hyperparathyroidism of renal origin: Secondary | ICD-10-CM | POA: Diagnosis present

## 2022-11-04 DIAGNOSIS — I3139 Other pericardial effusion (noninflammatory): Secondary | ICD-10-CM | POA: Diagnosis present

## 2022-11-04 DIAGNOSIS — I517 Cardiomegaly: Secondary | ICD-10-CM | POA: Diagnosis present

## 2022-11-04 DIAGNOSIS — I340 Nonrheumatic mitral (valve) insufficiency: Secondary | ICD-10-CM | POA: Diagnosis not present

## 2022-11-04 DIAGNOSIS — I130 Hypertensive heart and chronic kidney disease with heart failure and stage 1 through stage 4 chronic kidney disease, or unspecified chronic kidney disease: Secondary | ICD-10-CM | POA: Diagnosis present

## 2022-11-04 DIAGNOSIS — F1410 Cocaine abuse, uncomplicated: Secondary | ICD-10-CM | POA: Diagnosis not present

## 2022-11-04 DIAGNOSIS — I447 Left bundle-branch block, unspecified: Secondary | ICD-10-CM | POA: Diagnosis not present

## 2022-11-04 DIAGNOSIS — F17210 Nicotine dependence, cigarettes, uncomplicated: Secondary | ICD-10-CM | POA: Diagnosis present

## 2022-11-04 DIAGNOSIS — N250 Renal osteodystrophy: Secondary | ICD-10-CM | POA: Diagnosis not present

## 2022-11-04 DIAGNOSIS — N183 Chronic kidney disease, stage 3 unspecified: Secondary | ICD-10-CM | POA: Diagnosis not present

## 2022-11-04 DIAGNOSIS — N184 Chronic kidney disease, stage 4 (severe): Secondary | ICD-10-CM | POA: Diagnosis not present

## 2022-11-04 HISTORY — DX: Chronic kidney disease, stage 3b: N18.32

## 2022-11-04 HISTORY — DX: Cocaine use, unspecified, uncomplicated: F14.90

## 2022-11-04 HISTORY — DX: Essential (primary) hypertension: I10

## 2022-11-04 HISTORY — DX: Tobacco use: Z72.0

## 2022-11-04 LAB — URINALYSIS, ROUTINE W REFLEX MICROSCOPIC
Bacteria, UA: NONE SEEN
Bilirubin Urine: NEGATIVE
Glucose, UA: NEGATIVE mg/dL
Ketones, ur: NEGATIVE mg/dL
Leukocytes,Ua: NEGATIVE
Nitrite: NEGATIVE
Protein, ur: 100 mg/dL — AB
Specific Gravity, Urine: 1.011 (ref 1.005–1.030)
pH: 5 (ref 5.0–8.0)

## 2022-11-04 LAB — RAPID URINE DRUG SCREEN, HOSP PERFORMED
Amphetamines: NOT DETECTED
Barbiturates: NOT DETECTED
Benzodiazepines: NOT DETECTED
Cocaine: POSITIVE — AB
Opiates: NOT DETECTED
Tetrahydrocannabinol: NOT DETECTED

## 2022-11-04 LAB — CBC WITH DIFFERENTIAL/PLATELET
Abs Immature Granulocytes: 0.01 10*3/uL (ref 0.00–0.07)
Basophils Absolute: 0 10*3/uL (ref 0.0–0.1)
Basophils Relative: 0 %
Eosinophils Absolute: 0.2 10*3/uL (ref 0.0–0.5)
Eosinophils Relative: 3 %
HCT: 31.1 % — ABNORMAL LOW (ref 39.0–52.0)
Hemoglobin: 10.7 g/dL — ABNORMAL LOW (ref 13.0–17.0)
Immature Granulocytes: 0 %
Lymphocytes Relative: 22 %
Lymphs Abs: 1.2 10*3/uL (ref 0.7–4.0)
MCH: 31.7 pg (ref 26.0–34.0)
MCHC: 34.4 g/dL (ref 30.0–36.0)
MCV: 92 fL (ref 80.0–100.0)
Monocytes Absolute: 0.6 10*3/uL (ref 0.1–1.0)
Monocytes Relative: 10 %
Neutro Abs: 3.5 10*3/uL (ref 1.7–7.7)
Neutrophils Relative %: 65 %
Platelets: 198 10*3/uL (ref 150–400)
RBC: 3.38 MIL/uL — ABNORMAL LOW (ref 4.22–5.81)
RDW: 14.2 % (ref 11.5–15.5)
WBC: 5.5 10*3/uL (ref 4.0–10.5)
nRBC: 0 % (ref 0.0–0.2)

## 2022-11-04 LAB — BASIC METABOLIC PANEL
Anion gap: 8 (ref 5–15)
BUN: 60 mg/dL — ABNORMAL HIGH (ref 6–20)
CO2: 23 mmol/L (ref 22–32)
Calcium: 7.9 mg/dL — ABNORMAL LOW (ref 8.9–10.3)
Chloride: 109 mmol/L (ref 98–111)
Creatinine, Ser: 5.8 mg/dL — ABNORMAL HIGH (ref 0.61–1.24)
GFR, Estimated: 11 mL/min — ABNORMAL LOW (ref >60)
Glucose, Bld: 96 mg/dL (ref 70–99)
Potassium: 3.8 mmol/L (ref 3.5–5.1)
Sodium: 140 mmol/L (ref 135–145)

## 2022-11-04 LAB — TSH: TSH: 2.352 u[IU]/mL (ref 0.350–4.500)

## 2022-11-04 LAB — IRON AND TIBC
Iron: 60 ug/dL (ref 45–182)
Saturation Ratios: 26 % (ref 17.9–39.5)
TIBC: 233 ug/dL — ABNORMAL LOW (ref 250–450)
UIBC: 173 ug/dL

## 2022-11-04 LAB — LIPID PANEL
Cholesterol: 130 mg/dL (ref 0–200)
HDL: 31 mg/dL — ABNORMAL LOW (ref >40)
LDL Cholesterol: 70 mg/dL (ref 0–99)
Total CHOL/HDL Ratio: 4.2 RATIO
Triglycerides: 144 mg/dL (ref <150)
VLDL: 29 mg/dL (ref 0–40)

## 2022-11-04 LAB — PROTEIN / CREATININE RATIO, URINE: Total Protein, Urine: <6 mg/dL

## 2022-11-04 LAB — ECHOCARDIOGRAM COMPLETE
Area-P 1/2: 4.86 cm2
Radius: 0.2 cm
S' Lateral: 5.1 cm

## 2022-11-04 LAB — PHOSPHORUS: Phosphorus: 4.3 mg/dL (ref 2.5–4.6)

## 2022-11-04 LAB — FOLATE: Folate: 5.7 ng/mL — ABNORMAL LOW (ref >5.9)

## 2022-11-04 LAB — VITAMIN B12: Vitamin B-12: 209 pg/mL (ref 180–914)

## 2022-11-04 LAB — RETICULOCYTES
Immature Retic Fract: 9.6 % (ref 2.3–15.9)
RBC.: 3.66 MIL/uL — ABNORMAL LOW (ref 4.22–5.81)
Retic Count, Absolute: 61.5 10*3/uL (ref 19.0–186.0)
Retic Ct Pct: 1.7 % (ref 0.4–3.1)

## 2022-11-04 LAB — FERRITIN: Ferritin: 227 ng/mL (ref 24–336)

## 2022-11-04 LAB — BRAIN NATRIURETIC PEPTIDE: B Natriuretic Peptide: 677 pg/mL — ABNORMAL HIGH (ref 0.0–100.0)

## 2022-11-04 LAB — MAGNESIUM: Magnesium: 2.1 mg/dL (ref 1.7–2.4)

## 2022-11-04 LAB — VITAMIN D 25 HYDROXY (VIT D DEFICIENCY, FRACTURES): Vit D, 25-Hydroxy: 32.57 ng/mL (ref 30–100)

## 2022-11-04 MED ORDER — NICOTINE 21 MG/24HR TD PT24
21.0000 mg | MEDICATED_PATCH | Freq: Every day | TRANSDERMAL | Status: DC
  Administered 2022-11-04 – 2022-11-08 (×3): 21 mg via TRANSDERMAL
  Filled 2022-11-04 (×5): qty 1

## 2022-11-04 MED ORDER — ACETAMINOPHEN 325 MG PO TABS: 650.0000 mg | ORAL_TABLET | Freq: Four times a day (QID) | ORAL | Status: DC | PRN

## 2022-11-04 MED ORDER — AMLODIPINE BESYLATE 5 MG PO TABS
5.0000 mg | ORAL_TABLET | Freq: Once | ORAL | Status: AC
Start: 2022-11-04 — End: 2022-11-04
  Administered 2022-11-04: 5 mg via ORAL
  Filled 2022-11-04: qty 1

## 2022-11-04 MED ORDER — ACETAMINOPHEN 650 MG RE SUPP: 650.0000 mg | Freq: Four times a day (QID) | RECTAL | Status: DC | PRN

## 2022-11-04 MED ORDER — FENTANYL CITRATE PF 50 MCG/ML IJ SOSY: 12.5000 ug | PREFILLED_SYRINGE | INTRAMUSCULAR | Status: DC | PRN

## 2022-11-04 MED ORDER — ONDANSETRON HCL 4 MG PO TABS
4.0000 mg | ORAL_TABLET | Freq: Four times a day (QID) | ORAL | Status: DC | PRN
  Administered 2022-11-05: 4 mg via ORAL
  Filled 2022-11-04: qty 1

## 2022-11-04 MED ORDER — MELATONIN 3 MG PO TABS
3.0000 mg | ORAL_TABLET | Freq: Every day | ORAL | Status: DC
  Administered 2022-11-05 – 2022-11-07 (×4): 3 mg via ORAL
  Filled 2022-11-04 (×4): qty 1

## 2022-11-04 MED ORDER — CARVEDILOL 3.125 MG PO TABS
6.2500 mg | ORAL_TABLET | Freq: Two times a day (BID) | ORAL | Status: DC
  Administered 2022-11-04 – 2022-11-08 (×9): 6.25 mg via ORAL
  Filled 2022-11-04 (×9): qty 2

## 2022-11-04 MED ORDER — TRAZODONE HCL 50 MG PO TABS: 25.0000 mg | ORAL_TABLET | Freq: Every evening | ORAL | Status: DC | PRN

## 2022-11-04 MED ORDER — HEPARIN SODIUM (PORCINE) 5000 UNIT/ML IJ SOLN
5000.0000 [IU] | Freq: Three times a day (TID) | INTRAMUSCULAR | Status: DC
  Administered 2022-11-04 – 2022-11-07 (×10): 5000 [IU] via SUBCUTANEOUS
  Filled 2022-11-04 (×11): qty 1

## 2022-11-04 MED ORDER — OXYCODONE HCL 5 MG PO TABS
5.0000 mg | ORAL_TABLET | Freq: Four times a day (QID) | ORAL | Status: DC | PRN
  Administered 2022-11-05 – 2022-11-06 (×3): 5 mg via ORAL
  Filled 2022-11-04 (×3): qty 1

## 2022-11-04 MED ORDER — BISACODYL 5 MG PO TBEC: 5.0000 mg | DELAYED_RELEASE_TABLET | Freq: Every day | ORAL | Status: DC | PRN

## 2022-11-04 MED ORDER — PERFLUTREN LIPID MICROSPHERE
1.0000 mL | INTRAVENOUS | Status: AC | PRN
  Administered 2022-11-04: 4 mL via INTRAVENOUS

## 2022-11-04 MED ORDER — AMLODIPINE BESYLATE 5 MG PO TABS
10.0000 mg | ORAL_TABLET | Freq: Every day | ORAL | Status: DC
  Administered 2022-11-05 – 2022-11-08 (×4): 10 mg via ORAL
  Filled 2022-11-04 (×4): qty 2

## 2022-11-04 MED ORDER — ONDANSETRON HCL 4 MG/2ML IJ SOLN: 4.0000 mg | Freq: Four times a day (QID) | INTRAMUSCULAR | Status: DC | PRN

## 2022-11-04 MED ORDER — ALPRAZOLAM 0.25 MG PO TABS
0.2500 mg | ORAL_TABLET | Freq: Three times a day (TID) | ORAL | Status: DC | PRN
  Administered 2022-11-04: 0.25 mg via ORAL
  Filled 2022-11-04: qty 1

## 2022-11-04 MED ORDER — LACTATED RINGERS IV SOLN: INTRAVENOUS | Status: DC

## 2022-11-04 MED ORDER — AMLODIPINE BESYLATE 5 MG PO TABS: 5.0000 mg | ORAL_TABLET | Freq: Every day | ORAL | Status: DC

## 2022-11-04 NOTE — TOC Progression Note (Addendum)
  Transition of Care (TOC) Screening Note   Patient Details  Name: Damon Williams Date of Birth: [birth year]   Transition of Care [facility]) CM/SW Contact:    Elliot Gault, LCSW Phone Number: 11/04/2022, 10:19 AM    PCP list added to AVS.  Transition of Care Department [facility]) has reviewed patient and no TOC needs have been identified at this time. We will continue to monitor patient advancement through interdisciplinary progression rounds. If new patient transition needs arise, please place a TOC consult.

## 2022-11-04 NOTE — ED Notes (Signed)
Echo in room at this time ? ?

## 2022-11-04 NOTE — ED Triage Notes (Signed)
Pt states his BP readings have been high for the last week; last BP 171/101  Pt c/o headache and having sob when lying down  Pt states he had a nosebleed this week that did not last long

## 2022-11-04 NOTE — ED Notes (Signed)
Pt to bathroom with NT. Ambulating without difficulty.

## 2022-11-04 NOTE — Consult Note (Signed)
[facility]  HISTORY AND PHYSICAL  GOVIND FUREY is an 62 y.o. male.    Chief Complaint: high BP  HPI: PT is a 29M with a PMH sig for HTN who is now seen in consultation at the request of DR, Laural Benes for eval and recs re: AKI on CKD.    Pt doesn't have a lot of contact with the medical system and doesn't really go to the doctor.  Cr was 1.8 02/14/2022, no other records for review.  He's had at least a couple weeks of high BP in the 200s/110s and a nosebleed.  His legs were swollen a couple weeks ago but none since.  Not able to lay flat really well, had to open window and get some fresh air several times this week.  Has had some abd pain earlier this week.  No CP.  NO rashes, oral ulcers, dry/ gritty feeling in eyes, joint pains.  No NSAIDs, herbal supps.    In this setting we are asked to see.  No foamy/ frothy urine,  NO blood in urine or dark/ tea colored.    PMH: Past Medical History:  Diagnosis Date   Hypertension    PSH: Past Surgical History:  Procedure Laterality Date   BACK SURGERY        Past Medical History:  Diagnosis Date   Hypertension     Medications:  Scheduled:  [START ON 11/05/2022] amLODipine  10 mg Oral Daily   carvedilol  6.25 mg Oral BID WC   heparin  5,000 Units Subcutaneous Q8H   nicotine  21 mg Transdermal Daily    (Not in a hospital admission)   ALLERGIES:  No Known Allergies  FAM HX: History reviewed. No pertinent family history.  Social History:   reports that he has been smoking cigarettes. He has a 30.00 pack-year smoking history. He has never used smokeless tobacco. He reports that he does not drink alcohol and does not use drugs.  ROS: ROS: all other systems reviewed and are negative except as per HPI   Blood pressure (!) 164/107, pulse 81, temperature 98.2 F (36.8 C), temperature source Oral, resp. rate (!) 25, SpO2 99 %. PHYSICAL EXAM: GEN nad, sitting semi-upright in gurney HEENT EOMI PERRL NECK no  JVD PULM bibasilar crackles CV RRR, loud S2 ABD soft EXT no LE edema NEURO AAO x 3    Results for orders placed or performed during the hospital encounter of 11/04/22 (from the past 48 hour(s))  Basic metabolic panel     Status: Abnormal   Collection Time: 11/04/22  8:41 AM  Result Value Ref Range   Sodium 140 135 - 145 mmol/L   Potassium 3.8 3.5 - 5.1 mmol/L   Chloride 109 98 - 111 mmol/L   CO2 23 22 - 32 mmol/L   Glucose, Bld 96 70 - 99 mg/dL    Comment: Glucose reference range applies only to samples taken after fasting for at least 8 hours.   BUN 60 (H) 6 - 20 mg/dL   Creatinine, Ser 1.61 (H) 0.61 - 1.24 mg/dL   Calcium 7.9 (L) 8.9 - 10.3 mg/dL   GFR, Estimated 11 (L) >60 mL/min    Comment: (NOTE) Calculated using the CKD-EPI Creatinine Equation (2021)    Anion gap 8 5 - 15    Comment: Performed at [facility], [address], [place], [place] [address]  CBC with Differential     Status: Abnormal   Collection Time: 11/04/22  9:01 AM  Result Value Ref Range   WBC 5.5 4.0 - 10.5 K/uL   RBC 3.38 (L) 4.22 - 5.81 MIL/uL   Hemoglobin 10.7 (L) 13.0 - 17.0 g/dL   HCT 40.9 (L) 81.1 - 91.4 %   MCV 92.0 80.0 - 100.0 fL   MCH 31.7 26.0 - 34.0 pg   MCHC 34.4 30.0 - 36.0 g/dL   RDW 78.2 95.6 - 21.3 %   Platelets 198 150 - 400 K/uL   nRBC 0.0 0.0 - 0.2 %   Neutrophils Relative % 65 %   Neutro Abs 3.5 1.7 - 7.7 K/uL   Lymphocytes Relative 22 %   Lymphs Abs 1.2 0.7 - 4.0 K/uL   Monocytes Relative 10 %   Monocytes Absolute 0.6 0.1 - 1.0 K/uL   Eosinophils Relative 3 %   Eosinophils Absolute 0.2 0.0 - 0.5 K/uL   Basophils Relative 0 %   Basophils Absolute 0.0 0.0 - 0.1 K/uL   Immature Granulocytes 0 %   Abs Immature Granulocytes 0.01 0.00 - 0.07 K/uL    Comment: Performed at [facility], [address], [place], [place] [address]  Magnesium     Status: None   Collection Time: 11/04/22  9:01 AM  Result Value Ref Range   Magnesium 2.1 1.7 - 2.4 mg/dL    Comment: Performed at  [facility], [address] [place], [place] [address]  Phosphorus     Status: None   Collection Time: 11/04/22  9:01 AM  Result Value Ref Range   Phosphorus 4.3 2.5 - 4.6 mg/dL    Comment: Performed at [facility], [address], [place], [place] [address]  TSH     Status: None   Collection Time: 11/04/22  9:01 AM  Result Value Ref Range   TSH 2.352 0.350 - 4.500 uIU/mL    Comment: Performed by a 3rd Generation assay with a functional sensitivity of <=0.01 uIU/mL. Performed at [facility], [address], [place], [place] [address]   Lipid panel     Status: Abnormal   Collection Time: 11/04/22  9:02 AM  Result Value Ref Range   Cholesterol 130 0 - 200 mg/dL   Triglycerides 401 <027 mg/dL   HDL 31 (L) >25 mg/dL   Total CHOL/HDL Ratio 4.2 RATIO   VLDL 29 0 - 40 mg/dL   LDL Cholesterol 70 0 - 99 mg/dL    Comment:        Total Cholesterol/HDL:CHD Risk Coronary Heart Disease Risk Table                     Men   Women  1/2 Average Risk   3.4   3.3  Average Risk       5.0   4.4  2 X Average Risk   9.6   7.1  3 X Average Risk  23.4   11.0        Use the calculated Patient Ratio above and the CHD Risk Table to determine the patient's CHD Risk.        ATP III CLASSIFICATION (LDL):  <100     mg/dL   Optimal  366-440  mg/dL   Near or Above                    Optimal  130-159  mg/dL   Borderline  347-425  mg/dL   High  >956     mg/dL   Very High Performed at [facility], [address], [place], [place] [address]   Vitamin B12     Status: None   Collection Time: 11/04/22  9:02 AM  Result Value Ref Range   Vitamin B-12 209 180 - 914 pg/mL    Comment: (NOTE) This assay is not validated for testing neonatal or myeloproliferative syndrome specimens for Vitamin B12 levels. Performed at [facility], [address], [place], [place] [address]   Folate     Status: Abnormal   Collection Time: 11/04/22  9:02 AM  Result Value Ref Range   Folate 5.7 (L) >5.9 ng/mL    Comment:  Performed at [facility], [address] [place], [place] [address]  Iron and TIBC     Status: Abnormal   Collection Time: 11/04/22  9:02 AM  Result Value Ref Range   Iron 60 45 - 182 ug/dL   TIBC 213 (L) 086 - 578 ug/dL   Saturation Ratios 26 17.9 - 39.5 %   UIBC 173 ug/dL    Comment: Performed at [facility], [address] [place], [place] [address]  Ferritin     Status: None   Collection Time: 11/04/22  9:02 AM  Result Value Ref Range   Ferritin 227 24 - 336 ng/mL    Comment: Performed at [facility], [address], [place], [place] [address]  Reticulocytes     Status: Abnormal   Collection Time: 11/04/22 10:28 AM  Result Value Ref Range   Retic Ct Pct 1.7 0.4 - 3.1 %   RBC. 3.66 (L) 4.22 - 5.81 MIL/uL   Retic Count, Absolute 61.5 19.0 - 186.0 K/uL   Immature Retic Fract 9.6 2.3 - 15.9 %    Comment: Performed at [facility], [address] [place], [place] [address]    US RENAL  Result Date: 11/04/2022 CLINICAL DATA:  Renal failure EXAM: RENAL / URINARY TRACT ULTRASOUND COMPLETE COMPARISON:  None Available. FINDINGS: Right Kidney: Renal measurements: 10.0 x 3.9 x 4.8 cm = volume: 98.4 mL. Small anechoic structure seen towards the lower pole measuring 12 mm. Adjacent similar 13 mm focus. Both these are simple appearing cysts. No collecting system dilatation or perinephric fluid. Right kidney appears slightly echogenic. Left Kidney: Renal measurements: 10.4 x 4.5 x 4.6 cm = volume: 113.4 mL. No collecting system dilatation. The kidney appears slightly echogenic. Simple cystic focus seen towards the upper pole measuring 9 mm. Bladder: Distended bladder with slight wall thickening Other: None. IMPRESSION: No collecting system dilatation. Simple bilateral renal cysts.  No specific imaging follow-up. Both kidneys appear slightly echogenic. Please correlate for any clinical evidence of chronic medical renal disease Electronically Signed   By: Karen Kays M.D.   On: 11/04/2022 10:29     Assessment/Plan  AKI on CKD 3b: not a whole lot of values to go on from.  Renal US with some echogenic kidneys, slightly smaller but not bad.  Unclear if hypertensive urgency caused AKI or if primary renal problem caused HTN.  Either way, workup as follows: - UA, UP/C, basic serologies - controlling BP - +/- biopsy- don't think will need if cotnrolling BP/ volume improves Cr - follow closely  2.  Hypertensive urgency:  - amlodipine 10 mg daily  - carvedilol 6.25 mg BID  - anticipate TTE  - may need diuretic once we sort everything out  3.  Anemia:  - iron studies  4.  Bone/ mineral:  - chec PTH  5.  Dispo: admitted  Bufford Buttner 11/04/2022, 11:57 AM

## 2022-11-04 NOTE — Plan of Care (Signed)
  Problem: Education: Goal: Knowledge of General Education information will improve Description Including pain rating scale, medication(s)/side effects and non-pharmacologic comfort measures Outcome: Progressing   Problem: Health Behavior/Discharge Planning: Goal: Ability to manage health-related needs will improve Outcome: Progressing   

## 2022-11-04 NOTE — Hospital Course (Signed)
62 year old gentleman longtime smoker with history of hypertension, history of back surgery, not on chronic medications or having regular medical follow-up reportedly has been monitoring blood pressure at home and noted very high systolic blood pressure readings at home in the 200/120 range.  He noticed that over the past several days he has had even higher blood pressure readings at home and decided to come into the emergency department because he has had increasing shortness of breath over the past couple of days.  He says that he is not able to lie flat due to severe breath and having to sleep sitting up due to shortness of breath.  He denies having chest pain symptoms.  He denies fever and chills.  He says that he has been taking acetaminophen tablets for chronic headaches.  He denies taking any NSAID medications over-the-counter including ibuprofen, Goody powder, aspirin, naproxen/Aleve.  He presented to the emergency department on 10/31/2022 complaining of shortness of breath.  He denied cough and leg swelling.  For some reason he ended up leaving before being seen by provider.  The patient says that he did make an appointment in the first or second week of May 2024 at the local [facility] clinic but has not yet established care.  He was noted to be hypertensive on arrival oxygen saturation 99% on room air, no chest pain symptoms.  His workup shows that his hemoglobin is 10.7 hematocrit 31.1 and his creatinine is 5.80 with a BUN of 60 and a potassium of 3.8.  Normal mag and Phos levels.  In August 2023 he had a creatinine of 1.96 and BUN of 28 at that time.  His hemoglobin was 13.1 at that time.  Nephrology was consulted by the ED provider who recommended patient be admitted for further management and blood pressure control.  Patient was started on amlodipine in the ED.  Admission requested for further management.

## 2022-11-04 NOTE — H&P (Signed)
History and Physical  [facility]  FREEDOM PEDDY WUJ:[id number] DOB: 09-Jan-1961 DOA: 11/04/2022  PCP: Patient, No Pcp Per  Patient coming from: Home  Level of care: Telemetry  I have personally briefly reviewed patient's old medical records in [facility]  Chief Complaint: high blood pressure   HPI: Damon Williams is a 62 year old gentleman longtime smoker with history of hypertension, history of back surgery, not on chronic medications or having regular medical follow-up reportedly has been monitoring blood pressure at home and noted very high systolic blood pressure readings at home in the 200/120 range.  He noticed that over the past several days he has had even higher blood pressure readings at home and decided to come into the emergency department because he has had increasing shortness of breath over the past couple of days.  He says that he is not able to lie flat due to severe breath and having to sleep sitting up due to shortness of breath.  He denies having chest pain symptoms.  He denies fever and chills.  He says that he has been taking acetaminophen tablets for chronic headaches.  He denies taking any NSAID medications over-the-counter including ibuprofen, Goody powder, aspirin, naproxen/Aleve.  He presented to the emergency department on 10/31/2022 complaining of shortness of breath.  He denied cough and leg swelling.  For some reason he ended up leaving before being seen by provider.  The patient says that he did make an appointment in the first or second week of May 2024 at the local [facility] clinic but has not yet established care.  He was noted to be hypertensive on arrival oxygen saturation 99% on room air, no chest pain symptoms.  His workup shows that his hemoglobin is 10.7 hematocrit 31.1 and his creatinine is 5.80 with a BUN of 60 and a potassium of 3.8.  Normal mag and Phos levels.  In August 2023 he had a creatinine of 1.96 and BUN of 28 at that time.  His  hemoglobin was 13.1 at that time.  Nephrology was consulted by the ED provider who recommended patient be admitted for further management and blood pressure control.  Patient was started on amlodipine in the ED.  Admission requested for further management.    Past Medical History:  Diagnosis Date   Hypertension     Past Surgical History:  Procedure Laterality Date   BACK SURGERY       reports that he has been smoking cigarettes. He has a 30.00 pack-year smoking history. He has never used smokeless tobacco. He reports that he does not drink alcohol and does not use drugs.  No Known Allergies  History reviewed. No pertinent family history.  Prior to Admission medications   Medication Sig Start Date End Date Taking? Authorizing Provider  cephALEXin (KEFLEX) 500 MG capsule Take 1 capsule (500 mg total) by mouth 4 (four) times daily. Patient not taking: Reported on 01/02/2018 01/01/18   Burgess Amor, PA-C  cephALEXin (KEFLEX) 500 MG capsule Take 1 capsule (500 mg total) by mouth 4 (four) times daily. Patient not taking: Reported on 11/04/2022 01/02/18   Cathren Laine, MD  HYDROcodone-acetaminophen (NORCO/VICODIN) 5-325 MG tablet Take one tab po q 4 hrs prn pain Patient not taking: Reported on 11/04/2022 09/20/21   Triplett, Tammy, PA-C  methocarbamol (ROBAXIN) 500 MG tablet Take 1 tablet (500 mg total) by mouth 3 (three) times daily. Patient not taking: Reported on 11/04/2022 09/20/21   Triplett, Tammy, PA-C  predniSONE (DELTASONE) 10  MG tablet Take 6 tablets day one, 5 tablets day two, 4 tablets day three, 3 tablets day four, 2 tablets day five, then 1 tablet day six Patient not taking: Reported on 11/04/2022 10/09/19   Pauline Aus, PA-C    Physical Exam: Vitals:   11/04/22 0820 11/04/22 0830 11/04/22 0900  BP: (!) 171/101 (!) 171/99 (!) 164/107  Pulse: 86 84 81  Resp: 18  (!) 25  Temp: 98.2 F (36.8 C)    TempSrc: Oral    SpO2: 98% 99% 99%    Constitutional: frail, elderly  appearing, NAD, calm, comfortable Eyes: PERRL, lids and conjunctivae normal ENMT: Mucous membranes are pale and moist. Posterior pharynx clear of any exudate or lesions. Poor dentition.  Neck: normal, supple, no masses, no thyromegaly Respiratory: clear to auscultation bilaterally, no wheezing, no crackles. Normal respiratory effort. No accessory muscle use.  Cardiovascular: normal s1, s2 sounds, trace extremity edema. 2+ pedal pulses. No carotid bruits.  Abdomen: no tenderness, no masses palpated. No hepatosplenomegaly. Bowel sounds positive.  Musculoskeletal: no clubbing / cyanosis. No joint deformity upper and lower extremities. Good ROM, no contractures. Normal muscle tone.  Skin: no rashes, lesions, ulcers. No induration Neurologic: CN 2-12 grossly intact. Sensation intact, DTR normal. Strength 5/5 in all 4.  Psychiatric: poor  judgment and insight. Alert and oriented x 3. Normal mood.   Labs on Admission: I have personally reviewed following labs and imaging studies  CBC: Recent Labs  Lab 11/04/22 0901  WBC 5.5  NEUTROABS 3.5  HGB 10.7*  HCT 31.1*  MCV 92.0  PLT 198   Basic Metabolic Panel: Recent Labs  Lab 11/04/22 0841 11/04/22 0901  NA 140  --   K 3.8  --   CL 109  --   CO2 23  --   GLUCOSE 96  --   BUN 60*  --   CREATININE 5.80*  --   CALCIUM 7.9*  --   MG  --  2.1  PHOS  --  4.3   GFR: Estimated Creatinine Clearance: 12.8 mL/min (A) (by C-G formula based on SCr of 5.8 mg/dL (H)). Liver Function Tests: No results for input(s): "AST", "ALT", "ALKPHOS", "BILITOT", "PROT", "ALBUMIN" in the last 168 hours. No results for input(s): "LIPASE", "AMYLASE" in the last 168 hours. No results for input(s): "AMMONIA" in the last 168 hours. Coagulation Profile: No results for input(s): "INR", "PROTIME" in the last 168 hours. Cardiac Enzymes: No results for input(s): "CKTOTAL", "CKMB", "CKMBINDEX", "TROPONINI" in the last 168 hours. BNP (last 3 results) No results for  input(s): "PROBNP" in the last 8760 hours. HbA1C: No results for input(s): "HGBA1C" in the last 72 hours. CBG: No results for input(s): "GLUCAP" in the last 168 hours. Lipid Profile: Recent Labs    11/04/22 0902  CHOL 130  HDL 31*  LDLCALC 70  TRIG 956  CHOLHDL 4.2   Thyroid Function Tests: Recent Labs    11/04/22 0901  TSH 2.352   Anemia Panel: Recent Labs    11/04/22 0902 11/04/22 1028  VITAMINB12 209  --   FOLATE 5.7*  --   FERRITIN 227  --   TIBC 233*  --   IRON 60  --   RETICCTPCT  --  1.7   Urine analysis: No results found for: "COLORURINE", "APPEARANCEUR", "LABSPEC", "PHURINE", "GLUCOSEU", "HGBUR", "BILIRUBINUR", "KETONESUR", "PROTEINUR", "UROBILINOGEN", "NITRITE", "LEUKOCYTESUR"  Radiological Exams on Admission: US RENAL  Result Date: 11/04/2022 CLINICAL DATA:  Renal failure EXAM: RENAL / URINARY TRACT ULTRASOUND COMPLETE COMPARISON:  None Available. FINDINGS: Right Kidney: Renal measurements: 10.0 x 3.9 x 4.8 cm = volume: 98.4 mL. Small anechoic structure seen towards the lower pole measuring 12 mm. Adjacent similar 13 mm focus. Both these are simple appearing cysts. No collecting system dilatation or perinephric fluid. Right kidney appears slightly echogenic. Left Kidney: Renal measurements: 10.4 x 4.5 x 4.6 cm = volume: 113.4 mL. No collecting system dilatation. The kidney appears slightly echogenic. Simple cystic focus seen towards the upper pole measuring 9 mm. Bladder: Distended bladder with slight wall thickening Other: None. IMPRESSION: No collecting system dilatation. Simple bilateral renal cysts.  No specific imaging follow-up. Both kidneys appear slightly echogenic. Please correlate for any clinical evidence of chronic medical renal disease Electronically Signed   By: Karen Kays M.D.   On: 11/04/2022 10:29    EKG: Independently reviewed. LVH   Assessment/Plan Principal Problem:   Acute renal failure (ARF) (HCC) Active Problems:   Cardiomegaly    Uncontrolled hypertension   Anemia in chronic kidney disease (CKD)   CKD (chronic kidney disease)   Current smoker   AKI on CKD stage 3b (presumed) - likely exacerbated by poorly controlled BP in setting of heart failure and cardiorenal syndrome - follow up renal ultrasound - check urinalysis and urine drug screen  - appreciate nephrology team consultation and recommendations  - renally dose medications as appropriate  - BP management with goal of normalization of BP over next few days - started amlodipine 5 mg daily, likely will need to increase to 10 mg  - started carvedilol 6.25 mg BID  - 2 gram sodium restricted diet   Uncontrolled hypertension  - continue amlodipine 5 mg daily  - continue carvedilol 6.25 mg BID  - check lipid panel - check TSH and vitamin D  - check A1c  - follow up urinalysis and urine tox screen   Anemia in CKD  - check anemia panel including B12, folic acid, iron, TIBC, retic count - recheck CBC in AM   Cardiomegaly  - suggested by CXR and findings on EKG - check 2D echocardiogram to further investigate   Presumed HFpEF - check cardiac BNP - follow up 2D Echocardiogram  - careful with IV fluids - 2 gram sodium restricted diet ordered  - further recommendations to follow   Current Smoker - Nicotine patch 21 mg daily ordered - tobacco cessation counseling ordered - advised absolute smoking cessation   DVT prophylaxis: [place] heparin   Code Status: Full   Family Communication: none present   Disposition Plan: anticipate home   Consults called: nephrology   Admission status: INP   Level of care: Telemetry Standley Dakins MD [facility] How to contact the [facility] Attending or Consulting provider 7A - 7P or covering provider during after hours 7P -7A, for this patient?  Check the care team in [facility] and look for a) attending/consulting [facility] provider listed and b) the [facility] team listed Log into [email or web address] and use [facility] universal password  to access. If you do not have the password, please contact the hospital operator. Locate the [facility] provider you are looking for under [facility] and page to a number that you can be directly reached. If you still have difficulty reaching the provider, please page the [facility] (Director on Call) for the Hospitalists listed on amion for assistance.   If 7PM-7AM, please contact night-coverage [email or web address] Password [facility]  11/04/2022, 11:28 AM

## 2022-11-04 NOTE — ED Provider Notes (Signed)
Damon Williams   CSN: [id number] Arrival date & time: 11/04/22  7829     History  Chief Complaint  Patient presents with   Headache    Damon Williams is a 62 y.o. male.  HPI   This patient is a 62 year old male, he has no prior history of diagnosed hypertension but does report that he has had some progressive elevation in his blood pressures over time and recently has been measuring it at home with an at home blood pressure cuff showing that he has had blood pressures measuring well over 200 and systolic and 120 in the diastolic.  On arrival his blood pressure is 170/100, he is asymptomatic at this time.  He denies any chest pain on exertion, denies headaches or blurred vision, no numbness or weakness.  He is just concerned because the blood pressure is elevation.  He does report having a follow-up with his doctor as a new appointment in the first or second week of May at a clinic but does not want a wait that long to initiate treatment  Home Medications Prior to Admission medications   Medication Sig Start Date End Date Taking? Authorizing Provider  cephALEXin (KEFLEX) 500 MG capsule Take 1 capsule (500 mg total) by mouth 4 (four) times daily. Patient not taking: Reported on 01/02/2018 01/01/18   Burgess Amor, PA-C  cephALEXin (KEFLEX) 500 MG capsule Take 1 capsule (500 mg total) by mouth 4 (four) times daily. 01/02/18   Cathren Laine, MD  HYDROcodone-acetaminophen (NORCO/VICODIN) 5-325 MG tablet Take one tab po q 4 hrs prn pain 09/20/21   Triplett, Tammy, PA-C  methocarbamol (ROBAXIN) 500 MG tablet Take 1 tablet (500 mg total) by mouth 3 (three) times daily. 09/20/21   Triplett, Tammy, PA-C  predniSONE (DELTASONE) 10 MG tablet Take 6 tablets day one, 5 tablets day two, 4 tablets day three, 3 tablets day four, 2 tablets day five, then 1 tablet day six 10/09/19   Triplett, Tammy, PA-C      Allergies    Patient has no known allergies.     Review of Systems   Review of Systems  All other systems reviewed and are negative.   Physical Exam Updated Vital Signs BP (!) 164/107   Pulse 81   Temp 98.2 F (36.8 C) (Oral)   Resp (!) 25   SpO2 99%  Physical Exam Vitals and nursing Williams reviewed.  Constitutional:      General: He is not in acute distress.    Appearance: He is well-developed.  HENT:     Head: Normocephalic and atraumatic.     Mouth/Throat:     Pharynx: No oropharyngeal exudate.  Eyes:     General: No scleral icterus.       Right eye: No discharge.        Left eye: No discharge.     Conjunctiva/sclera: Conjunctivae normal.     Pupils: Pupils are equal, round, and reactive to light.  Neck:     Thyroid: No thyromegaly.     Vascular: No JVD.  Cardiovascular:     Rate and Rhythm: Normal rate and regular rhythm.     Heart sounds: Normal heart sounds. No murmur heard.    No friction rub. No gallop.  Pulmonary:     Effort: Pulmonary effort is normal. No respiratory distress.     Breath sounds: Normal breath sounds. No wheezing or rales.  Abdominal:     General: Bowel  sounds are normal. There is no distension.     Palpations: Abdomen is soft. There is no mass.     Tenderness: There is no abdominal tenderness.  Musculoskeletal:        General: No tenderness. Normal range of motion.     Cervical back: Normal range of motion and neck supple.     Right lower leg: No edema.     Left lower leg: No edema.  Lymphadenopathy:     Cervical: No cervical adenopathy.  Skin:    General: Skin is warm and dry.     Findings: No erythema or rash.  Neurological:     Mental Status: He is alert.     Coordination: Coordination normal.     Comments: Normal speech, normal coordination, normal gait, normal strength in all 4 extremities, cranial nerves III through XII are normal and the speech is clear  Psychiatric:        Behavior: Behavior normal.     ED Results / Procedures / Treatments   Labs (all labs ordered  are listed, but only abnormal results are displayed) Labs Reviewed  BASIC METABOLIC PANEL - Abnormal; Notable for the following components:      Result Value   BUN 60 (*)    Creatinine, Ser 5.80 (*)    Calcium 7.9 (*)    GFR, Estimated 11 (*)    All other components within normal limits  CBC WITH DIFFERENTIAL/PLATELET - Abnormal; Notable for the following components:   RBC 3.38 (*)    Hemoglobin 10.7 (*)    HCT 31.1 (*)    All other components within normal limits  MAGNESIUM  PHOSPHORUS  URINALYSIS, ROUTINE W REFLEX MICROSCOPIC    EKG EKG Interpretation  Date/Time:  Friday November 04 2022 08:37:22 EDT Ventricular Rate:  81 PR Interval:  176 QRS Duration: 85 QT Interval:  409 QTC Calculation: 475 R Axis:   54 Text Interpretation: Sinus rhythm LVH with secondary repolarization abnormality Anterior infarct, 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  Procedures Procedures    Medications Ordered in ED Medications  amLODipine (NORVASC) tablet 5 mg (5 mg Oral Given 11/04/22 6045)    ED Course/ Medical Decision Making/ A&P Clinical Course as of 11/04/22 1010  Fri Nov 04, 2022  4098 Specifically this patient denies having a headache to me, he states he did have a bit of chest pain a couple of weeks ago but is not having any at this time.  He does not have any abdominal pain nausea vomiting changes in vision numbness weakness lightheadedness or dizziness. [BM]    Clinical Course User Index [BM] Eber Hong, MD                             Medical Decision Making Amount and/or Complexity of Data Reviewed Labs: ordered. Radiology: ordered. ECG/medicine tests: ordered.  Risk Prescription drug management. Decision regarding hospitalization.   This patient presents to the ED for concern of untreated and uncontrolled hypertension differential diagnosis includes  primary coronary disease, the patient does smoke cigarettes so that certainly makes things worse, he is 62 years old on disability and does not work, I am concerned about primary uncontrolled hypertension and he likely does need to be started on something.   Additional history obtained:  Additional history obtained from medical record External records from outside source  obtained and reviewed including multiple visits to the ER over time, no admissions to the hospital, since 2001 when he was admitted by general surgery.  The patient has no family doctor visits and in fact does not have a family doctor as he will be following up for the first time this week The patient was here a couple of nights ago but decided to leave before being seen    Lab Tests:  I Ordered, and personally interpreted labs.  The pertinent results include: Metabolic panel, overall unremarkable  Imaging: No ultrasound performed, I agree with the radiologist interpretation and I personally interpreted the [place]  Cardiac monitoring: Normal sinus rhythm  EKG: Has signs of left ventricular hypertrophy with T wave abnormalities that appear to be repolarization abnormalities.  There is poor R wave progression through the precordial leads however I suspect this is more related to the left ventricular hypertrophy.  Medicines ordered and prescription drug management:  I ordered medication including amlodipine and IVF  for renal failure and hypertension Reevaluation of the patient after these medicines showed that the patient improved I have reviewed the patients home medicines and have made adjustments as needed   Problem List / ED Course:  No old EKG to compare but the patient does have left ventricular hypertrophy which was similar to 2 days ago.  He has never been seen by cardiologist but does not have any active cardiac ischemic symptoms.  He certainly has hypertension which has been untreated and uncontrolled and does not go  to the doctor.     Consultations: Renal failure - I discussed the care with Dr. Signe Colt and who will see the patient in consultation and recommends that the patient be admitted to the hospital, agrees with renal ultrasound and with blood pressure control, recommends amlodipine and then adding a beta-blocker if needed Dr. Laural Benes will admit   Social Determinants of Health:  Patient continues to smoke cigarettes, he was counseled on this at length.  Position: Admit         Final Clinical Impression(s) / ED Diagnoses Final diagnoses:  Acute kidney injury (HCC)  Uncontrolled hypertension  LVH (left ventricular hypertrophy)    Rx / DC Orders   Eber Hong, MD 11/04/22 1010

## 2022-11-04 NOTE — Discharge Instructions (Signed)
Providers Accepting New Patients in [place], [place]    [facility] [address]  [place], [place] [address] [phone] Accepts most insurances  [facility] [address] [place], [place] [address] [phone] Accepts most insurances  [facility] [place] [address] [place], [place] [address]  [phone] Must meet requirements  [facility] [address]  [place], [place] [address] [phone] Accepts most insurances  [facility] [address]  [place], [place] [address] [phone] Accepts most insurances  [facility] [address]   [place], [place]   [phone] Accepts most insurances  [facility] ([facility]  [address]  [place], [place] [address] [phone] Accepts most insurances     [facility] [address]  [place], [place] [address] [phone] Accepts most insurances  [facility] [address] [place], [place] [address] [phone] option 1 Accepts Medicaid and Uninsured  [facility] [address]  [place], [place] [address] [phone] Accepts most insurances  Tesfaye Fanta, MD [address]  [place], [place] [address] [phone] Accepts most insurances  [facility] [place] [address]  [place], [place] [address] [phone] Accepts most insurances  [facility] [address] [place], [place] [address] [phone] Accepts most insurances  Zack Hall, MD [address] [place], [place] [address] [phone]  Accepts most insurances      IMPORTANT INFORMATION: PAY CLOSE ATTENTION   PHYSICIAN DISCHARGE INSTRUCTIONS  Follow with Primary care provider  Patient, No Pcp Per  and other consultants as instructed by your Hospitalist Physician  SEEK MEDICAL CARE OR RETURN TO EMERGENCY ROOM IF  SYMPTOMS COME BACK, WORSEN OR NEW PROBLEM DEVELOPS   Please note: You were cared for by a hospitalist during your hospital stay. Every effort will be made to forward records to your primary care provider.  You can request that your primary care provider send for your hospital records if they have not received them.  Once you are discharged, your primary care physician will handle any further medical issues. Please note that NO REFILLS for any discharge medications will be authorized once you are discharged, as it is imperative that you return to your primary care physician (or establish a relationship with a primary care physician if you do not have one) for your post hospital discharge needs so that they can reassess your need for medications and monitor your lab values.  Please get a complete blood count and chemistry panel checked by your Primary MD at your next visit, and again as instructed by your Primary MD.  Get Medicines reviewed and adjusted: Please take all your medications with you for your next visit with your Primary MD  Laboratory/radiological data: Please request your Primary MD to go over all hospital tests and procedure/radiological results at the follow up, please ask your primary care provider to get all Hospital records sent to his/her office.  In some cases, they will be blood work, cultures and biopsy results pending at the time of your discharge. Please request that your primary care provider follow up on these results.  If you are diabetic, please bring your blood sugar readings with you to your follow up appointment with primary care.    Please call and make your follow up appointments as soon as possible.    Also Note the following: If you experience worsening   of your admission symptoms, develop shortness of breath, life threatening emergency, suicidal or homicidal thoughts you must seek medical attention immediately by calling 911 or calling your MD immediately  if  symptoms less severe.  You must read complete instructions/literature along with all the possible adverse reactions/side effects for all the Medicines you take and that have been prescribed to you. Take any new Medicines after you have completely understood and accpet all the possible adverse reactions/side effects.   Do not drive when taking Pain medications or sleeping medications (Benzodiazepines)  Do not take more than prescribed Pain, Sleep and Anxiety Medications. It is not advisable to combine anxiety,sleep and pain medications without talking with your primary care practitioner  Special Instructions: If you have smoked or chewed Tobacco  in the last 2 yrs please stop smoking, stop any regular Alcohol  and or any Recreational drug use.  Wear Seat belts while driving.  Do not drive if taking any narcotic, mind altering or controlled substances or recreational drugs or alcohol.                    

## 2022-11-05 DIAGNOSIS — I429 Cardiomyopathy, unspecified: Secondary | ICD-10-CM | POA: Diagnosis not present

## 2022-11-05 DIAGNOSIS — N1832 Chronic kidney disease, stage 3b: Secondary | ICD-10-CM | POA: Diagnosis not present

## 2022-11-05 DIAGNOSIS — I517 Cardiomegaly: Secondary | ICD-10-CM | POA: Diagnosis not present

## 2022-11-05 DIAGNOSIS — N179 Acute kidney failure, unspecified: Secondary | ICD-10-CM | POA: Diagnosis not present

## 2022-11-05 LAB — ENA+DNA/DS+ANTICH+CENTRO+JO...
Anti JO-1: <0.2 AI (ref 0.0–0.9)
Centromere Ab Screen: <0.2 AI (ref 0.0–0.9)
Chromatin Ab SerPl-aCnc: <0.2 AI (ref 0.0–0.9)
ENA SM Ab Ser-aCnc: <0.2 AI (ref 0.0–0.9)
Ribonucleic Protein: <0.2 AI (ref 0.0–0.9)
SSA (Ro) (ENA) Antibody, IgG: 7.2 AI — ABNORMAL HIGH (ref 0.0–0.9)
SSB (La) (ENA) Antibody, IgG: <0.2 AI (ref 0.0–0.9)
Scleroderma (Scl-70) (ENA) Antibody, IgG: <0.2 AI (ref 0.0–0.9)
ds DNA Ab: <1 IU/mL (ref 0–9)

## 2022-11-05 LAB — CBC
HCT: 28.5 % — ABNORMAL LOW (ref 39.0–52.0)
Hemoglobin: 9.7 g/dL — ABNORMAL LOW (ref 13.0–17.0)
MCH: 31.2 pg (ref 26.0–34.0)
MCHC: 34 g/dL (ref 30.0–36.0)
MCV: 91.6 fL (ref 80.0–100.0)
Platelets: 178 10*3/uL (ref 150–400)
RBC: 3.11 MIL/uL — ABNORMAL LOW (ref 4.22–5.81)
RDW: 13.9 % (ref 11.5–15.5)
WBC: 4.9 10*3/uL (ref 4.0–10.5)
nRBC: 0 % (ref 0.0–0.2)

## 2022-11-05 LAB — RENAL FUNCTION PANEL
Albumin: 2.4 g/dL — ABNORMAL LOW (ref 3.5–5.0)
Anion gap: 7 (ref 5–15)
BUN: 65 mg/dL — ABNORMAL HIGH (ref 6–20)
CO2: 21 mmol/L — ABNORMAL LOW (ref 22–32)
Calcium: 7.7 mg/dL — ABNORMAL LOW (ref 8.9–10.3)
Chloride: 109 mmol/L (ref 98–111)
Creatinine, Ser: 5.75 mg/dL — ABNORMAL HIGH (ref 0.61–1.24)
GFR, Estimated: 11 mL/min — ABNORMAL LOW (ref >60)
Glucose, Bld: 90 mg/dL (ref 70–99)
Phosphorus: 3.8 mg/dL (ref 2.5–4.6)
Potassium: 3.5 mmol/L (ref 3.5–5.1)
Sodium: 137 mmol/L (ref 135–145)

## 2022-11-05 LAB — ANA W/REFLEX IF POSITIVE: Anti Nuclear Antibody (ANA): POSITIVE — AB

## 2022-11-05 LAB — MAGNESIUM: Magnesium: 2 mg/dL (ref 1.7–2.4)

## 2022-11-05 LAB — HIV ANTIBODY (ROUTINE TESTING W REFLEX): HIV Screen 4th Generation wRfx: NONREACTIVE

## 2022-11-05 LAB — BRAIN NATRIURETIC PEPTIDE: B Natriuretic Peptide: 564 pg/mL — ABNORMAL HIGH (ref 0.0–100.0)

## 2022-11-05 MED ORDER — SPIRONOLACTONE 25 MG PO TABS
25.0000 mg | ORAL_TABLET | Freq: Every day | ORAL | Status: DC
  Administered 2022-11-05 – 2022-11-07 (×3): 25 mg via ORAL
  Filled 2022-11-05 (×3): qty 1

## 2022-11-05 MED ORDER — DARBEPOETIN ALFA 60 MCG/0.3ML IJ SOSY
60.0000 ug | PREFILLED_SYRINGE | Freq: Once | INTRAMUSCULAR | Status: AC
  Administered 2022-11-05: 60 ug via SUBCUTANEOUS
  Filled 2022-11-05: qty 0.3

## 2022-11-05 MED ORDER — SODIUM CHLORIDE 0.9 % IV SOLN
250.0000 mg | Freq: Every day | INTRAVENOUS | Status: AC
  Administered 2022-11-05 – 2022-11-08 (×4): 250 mg via INTRAVENOUS
  Filled 2022-11-05 (×4): qty 20

## 2022-11-05 MED ORDER — FUROSEMIDE 10 MG/ML IJ SOLN
40.0000 mg | Freq: Every day | INTRAMUSCULAR | Status: DC
  Administered 2022-11-05 – 2022-11-07 (×3): 40 mg via INTRAVENOUS
  Filled 2022-11-05 (×4): qty 4

## 2022-11-05 NOTE — TOC Progression Note (Signed)
Transition of Care [facility]) - Progression Note    Patient Details  Name: CHANCELER PULLIN MRN: [id number] Date of Birth: [birth year]  [facility] CM/SW Contact  Catalina Gravel, [place] Phone Number: 11/05/2022, 5:13 PM  Clinical Narrative:    Pt to see Cards Monday. DC Mon or Tues.    Expected Discharge Plan: Home/Self Care Barriers to Discharge: Continued Medical Work up  Expected Discharge Plan and Services                                               Social Determinants of Health (SDOH) Interventions SDOH Screenings   Tobacco Use: High Risk (11/04/2022)    Readmission Risk Interventions     No data to display

## 2022-11-05 NOTE — Progress Notes (Signed)
PROGRESS NOTE   Damon Williams  ZOX:[id number] DOB: 12/14/1960 DOA: 11/04/2022 PCP: Patient, No Pcp Per   Chief Complaint  Patient presents with   Headache   Level of care: Telemetry  Brief Admission History:  62 year old gentleman longtime smoker with history of hypertension, history of back surgery, not on chronic medications or having regular medical follow-up reportedly has been monitoring blood pressure at home and noted very high systolic blood pressure readings at home in the 200/120 range.  He noticed that over the past several days he has had even higher blood pressure readings at home and decided to come into the emergency department because he has had increasing shortness of breath over the past couple of days.  He says that he is not able to lie flat due to severe breath and having to sleep sitting up due to shortness of breath.  He denies having chest pain symptoms.  He denies fever and chills.  He says that he has been taking acetaminophen tablets for chronic headaches.  He denies taking any NSAID medications over-the-counter including ibuprofen, Goody powder, aspirin, naproxen/Aleve.  He presented to the emergency department on 10/31/2022 complaining of shortness of breath.  He denied cough and leg swelling.  For some reason he ended up leaving before being seen by provider.  The patient says that he did make an appointment in the first or second week of May 2024 at the local [facility] clinic but has not yet established care.  He was noted to be hypertensive on arrival oxygen saturation 99% on room air, no chest pain symptoms.  His workup shows that his hemoglobin is 10.7 hematocrit 31.1 and his creatinine is 5.80 with a BUN of 60 and a potassium of 3.8.  Normal mag and Phos levels.  In August 2023 he had a creatinine of 1.96 and BUN of 28 at that time.  His hemoglobin was 13.1 at that time.  Nephrology was consulted by the ED provider who recommended patient be admitted for further  management and blood pressure control.  Patient was started on amlodipine in the ED.  Admission requested for further management.   Assessment and Plan:  AKI on CKD stage 3b (presumed) - likely exacerbated by poorly controlled BP in setting of heart failure and cardiorenal syndrome - follow up renal ultrasound - no obstruction found  - appreciate nephrology team consultation and recommendations  - renally dose medications as appropriate  - BP management with goal of normalization of BP  - started amlodipine 5 mg daily, increased to 10 mg  - started carvedilol 6.25 mg BID  - 2 gram sodium restricted diet    Uncontrolled hypertension  - continue amlodipine 10 mg daily  - continue carvedilol 6.25 mg BID  - check lipid panel -- LDL 70 TC 130 HDL 31  - check TSH and vitamin D  - WNLs  - check A1c - pending - follow up urinalysis and urine tox screen (noted below)   Anemia in CKD  - check anemia panel including B12, folic acid, iron, TIBC, retic count - labs c/w anemia of chronic disease    New cardiomyopathy / Cardiomegaly  - suggested by CXR and findings on EKG - check 2D echocardiogram to further investigate - EF 30-35% global hypokinesis, grade 2 DD with small pericardial effusion - will ask cardiology team to see on 4/29 (not available at [facility] on weekends) - IV furosemide 40 mg daily started, pending response and renal function on 4/28 may increase  to BID dosing; - continue carvedilol 6.25 mg BID  - spironolactone 25 mg daily  - not a candidate for entresto now given creatinine >5    HFrEF - cardiac BNP trending down  - TTE noted above EF 30-35%  - DC IV fluid - 2 gram sodium restricted diet ordered  - further recommendations to follow    Current Smoker - Nicotine patch 21 mg daily ordered - tobacco cessation counseling ordered - advised absolute smoking cessation   Cocaine Positive UDS - pt denies ever using cocaine, reports girlfriend is a regular user - TOC  consultation requested for social services     DVT prophylaxis: [place] heparin Code Status: Full  Family Communication: none present  Disposition: Status is: Inpatient Remains inpatient appropriate because: IV furosemide started 4/27   Consultants:  Nephrology Cardiology  Procedures:  TTE 4/26  Antimicrobials:    Subjective: Pt denies that he ever used cocaine but says his girlfriend is a user. He denies CP, he still has weakness, he has some SOB and urinating frequently.    Objective: Vitals:   11/04/22 2311 11/04/22 2350 11/05/22 0400 11/05/22 0801  BP:  (!) 154/99 131/83 (!) 149/95  Pulse:  84 82 91  Resp:  (!) 22 20   Temp: 97.7 F (36.5 C) 98 F (36.7 C) 98.3 F (36.8 C)   TempSrc: Oral Oral Oral   SpO2:  100% 99%   Weight:  72 kg    Height:  5\' 7"  (1.702 m)      Intake/Output Summary (Last 24 hours) at 11/05/2022 1210 Last data filed at 11/05/2022 1610 Gross per 24 hour  Intake 1097.38 ml  Output --  Net 1097.38 ml   Filed Weights   11/04/22 2350  Weight: 72 kg   Examination:  General exam: Appears calm and comfortable  Respiratory system: Clear to auscultation. Respiratory effort normal. Cardiovascular system: normal S1 & S2 heard. No JVD, murmurs, rubs, gallops or clicks. No pedal edema. Gastrointestinal system: Abdomen is nondistended, soft and nontender. No organomegaly or masses felt. Normal bowel sounds heard. Central nervous system: Alert and oriented. No focal neurological deficits. Extremities: Symmetric 5 x 5 power. Skin: No rashes, lesions or ulcers. Psychiatry: Judgement and insight appear normal. Mood & affect appropriate.   Data Reviewed: I have personally reviewed following labs and imaging studies  CBC: Recent Labs  Lab 11/04/22 0901 11/05/22 0356  WBC 5.5 4.9  NEUTROABS 3.5  --   HGB 10.7* 9.7*  HCT 31.1* 28.5*  MCV 92.0 91.6  PLT 198 178    Basic Metabolic Panel: Recent Labs  Lab 11/04/22 0841 11/04/22 0901 11/05/22 0356   NA 140  --  137  K 3.8  --  3.5  CL 109  --  109  CO2 23  --  21*  GLUCOSE 96  --  90  BUN 60*  --  65*  CREATININE 5.80*  --  5.75*  CALCIUM 7.9*  --  7.7*  MG  --  2.1 2.0  PHOS  --  4.3 3.8    CBG: No results for input(s): "GLUCAP" in the last 168 hours.  No results found for this or any previous visit (from the past 240 hour(s)).   Radiology Studies: ECHOCARDIOGRAM COMPLETE  Result Date: 11/04/2022    ECHOCARDIOGRAM REPORT   Patient Name:   Damon Williams Date of Exam: 11/04/2022 Medical Rec #:  [id number]       Height:  67.0 in Accession #:    [id number]      Weight:       160.0 lb Date of Birth:  Aug 17, 1960      BSA:          1.839 m Patient Age:    61 years        BP:           148/96 mmHg Patient Gender: M               HR:           84 bpm. [facility] Procedure: 2D Echo, 3D Echo, Cardiac Doppler, Color Doppler and Intracardiac            Opacification Agent Indications:    Cardiomegaly I51.7  History:        Patient has no prior history of Echocardiogram examinations.                 Cardiomegaly; Risk Factors:Current Smoker and Hypertension.  Sonographer:    Aron Baba Referring Phys: [id number] Cleora Fleet  Sonographer Comments: Image acquisition challenging due to respiratory motion. IMPRESSIONS  1. Left ventricular ejection fraction, by estimation, is 30 to 35%. The left ventricle has moderately decreased function. The left ventricle demonstrates global hypokinesis. The left ventricular internal cavity size was mildly to moderately dilated. There is mild concentric left ventricular hypertrophy. Left ventricular diastolic parameters are consistent with Grade II diastolic dysfunction (pseudonormalization).  2. Right ventricular systolic function is low normal. The right ventricular size is normal. Tricuspid regurgitation signal is inadequate for assessing PA pressure.  3. Left atrial size was moderately dilated.  4. Right atrial size was mildly dilated.  5. A  small pericardial effusion is present. The pericardial effusion is posterior to the left ventricle.  6. The mitral valve is grossly normal, mildly thickened. Mild mitral valve regurgitation.  7. The aortic valve is tricuspid. Aortic valve regurgitation is not visualized.  8. Aortic dilatation noted. There is moderate dilatation of the aortic root, measuring 43 mm.  9. The inferior vena cava is dilated in size with >50% respiratory variability, suggesting right atrial pressure of 8 mmHg. Comparison(s): No prior Echocardiogram. Consider hypertensive versus infiltrative cardiomyopathy. FINDINGS  Left Ventricle: Left ventricular ejection fraction, by estimation, is 30 to 35%. The left ventricle has moderately decreased function. The left ventricle demonstrates global hypokinesis. Definity contrast agent was given IV to delineate the left ventricular endocardial borders. The left ventricular internal cavity size was mildly to moderately dilated. There is mild concentric left ventricular hypertrophy. Abnormal (paradoxical) septal motion, consistent with left bundle branch block. Left ventricular diastolic parameters are consistent with Grade II diastolic dysfunction (pseudonormalization). Right Ventricle: The right ventricular size is normal. No increase in right ventricular wall thickness. Right ventricular systolic function is low normal. Tricuspid regurgitation signal is inadequate for assessing PA pressure. Left Atrium: Left atrial size was moderately dilated. Right Atrium: Right atrial size was mildly dilated. Pericardium: A small pericardial effusion is present. The pericardial effusion is posterior to the left ventricle. Mitral Valve: The mitral valve is grossly normal. There is mild thickening of the mitral valve leaflet(s). Mild mitral valve regurgitation. Tricuspid Valve: The tricuspid valve is grossly normal. Tricuspid valve regurgitation is trivial. Aortic Valve: The aortic valve is tricuspid. Aortic valve  regurgitation is not visualized. Pulmonic Valve: The pulmonic valve was grossly normal. Pulmonic valve regurgitation is trivial. Aorta: Aortic dilatation noted. There is moderate dilatation of the aortic  root, measuring 43 mm. Venous: The inferior vena cava is dilated in size with greater than 50% respiratory variability, suggesting right atrial pressure of 8 mmHg. IAS/Shunts: No atrial level shunt detected by color flow Doppler.  LEFT VENTRICLE PLAX 2D LVIDd:         6.00 cm   Diastology LVIDs:         5.10 cm   LV e' medial:    5.45 cm/s LV PW:         1.00 cm   LV E/e' medial:  19.1 LV IVS:        1.20 cm   LV e' lateral:   4.87 cm/s LVOT diam:     2.30 cm   LV E/e' lateral: 21.4 LV SV:         60 LV SV Index:   33 LVOT Area:     4.15 cm                           3D Volume EF:                          3D EF:        36 %                          LV EDV:       241 ml                          LV ESV:       154 ml                          LV SV:        87 ml RIGHT VENTRICLE RV S prime:     10.30 cm/s TAPSE (M-mode): 1.4 cm LEFT ATRIUM              Index        RIGHT ATRIUM           Index LA diam:        4.20 cm  2.28 cm/m   RA Area:     22.70 cm LA Vol (A2C):   114.0 ml 61.98 ml/m  RA Volume:   70.60 ml  38.38 ml/m LA Vol (A4C):   83.3 ml  45.29 ml/m LA Biplane Vol: 103.0 ml 56.00 ml/m  AORTIC VALVE             PULMONIC VALVE LVOT Vmax:   85.70 cm/s  PR End Diast Vel: 13.69 msec LVOT Vmean:  54.200 cm/s LVOT VTI:    0.144 m  AORTA Ao Root diam: 4.50 cm Ao Asc diam:  3.50 cm MITRAL VALVE MV Area (PHT): 4.86 cm     SHUNTS MV Decel Time: 156 msec     Systemic VTI:  0.14 m MR PISA:        0.25 cm    Systemic Diam: 2.30 cm MR PISA Radius: 0.20 cm MV E velocity: 104.00 cm/s MV A velocity: 60.20 cm/s MV E/A ratio:  1.73 Nona Dell MD Electronically signed by Nona Dell MD Signature Date/Time: 11/04/2022/4:46:53 PM    Final    US RENAL  Result Date: 11/04/2022 CLINICAL DATA:  Renal failure EXAM: RENAL  / URINARY TRACT ULTRASOUND COMPLETE COMPARISON:  None Available. FINDINGS: Right  Kidney: Renal measurements: 10.0 x 3.9 x 4.8 cm = volume: 98.4 mL. Small anechoic structure seen towards the lower pole measuring 12 mm. Adjacent similar 13 mm focus. Both these are simple appearing cysts. No collecting system dilatation or perinephric fluid. Right kidney appears slightly echogenic. Left Kidney: Renal measurements: 10.4 x 4.5 x 4.6 cm = volume: 113.4 mL. No collecting system dilatation. The kidney appears slightly echogenic. Simple cystic focus seen towards the upper pole measuring 9 mm. Bladder: Distended bladder with slight wall thickening Other: None. IMPRESSION: No collecting system dilatation. Simple bilateral renal cysts.  No specific imaging follow-up. Both kidneys appear slightly echogenic. Please correlate for any clinical evidence of chronic medical renal disease Electronically Signed   By: Karen Kays M.D.   On: 11/04/2022 10:29    Scheduled Meds:  amLODipine  10 mg Oral Daily   carvedilol  6.25 mg Oral BID WC   furosemide  40 mg Intravenous Daily   heparin  5,000 Units Subcutaneous Q8H   melatonin  3 mg Oral QHS   nicotine  21 mg Transdermal Daily   Continuous Infusions:   LOS: 1 day   Time spent: 38  mins  Sharley Keeler Laural Benes, MD How to contact the [facility] Attending or Consulting provider 7A - 7P or covering provider during after hours 7P -7A, for this patient?  Check the care team in [facility] and look for a) attending/consulting [facility] provider listed and b) the [facility] team listed Log into [email or web address] and use [place]'s universal password to access. If you do not have the password, please contact the hospital operator. Locate the [facility] provider you are looking for under [facility] and page to a number that you can be directly reached. If you still have difficulty reaching the provider, please page the [facility] (Director on Call) for the Hospitalists listed on amion for assistance.  11/05/2022,  12:10 PM

## 2022-11-05 NOTE — Progress Notes (Signed)
Subjective:  UOP at least 900, he reports more-  BUN and crt about the same-  BP is better  Objective Vital signs in last 24 hours: Vitals:   11/04/22 2311 11/04/22 2350 11/05/22 0400 11/05/22 0801  BP:  (!) 154/99 131/83 (!) 149/95  Pulse:  84 82 91  Resp:  (!) 22 20   Temp: 97.7 F (36.5 C) 98 F (36.7 C) 98.3 F (36.8 C)   TempSrc: Oral Oral Oral   SpO2:  100% 99%   Weight:  72 kg    Height:  5\' 7"  (1.702 m)     Weight change:   Intake/Output Summary (Last 24 hours) at 11/05/2022 1350 Last data filed at 11/05/2022 1249 Gross per 24 hour  Intake 1097.38 ml  Output 900 ml  Net 197.38 ml    Assessment/ Plan: Pt is a 62 y.o. 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 Renal-  noted to have a crt of 1.96 in August of 23-  is now in the high 5's with HTN-  renal u/s 10+ cm kidneys-  slightly echogenic- U/A only 100 of protein, prt/crt ratio low-  does have 6-10 RBC/HPF-  many other labs pending including ANA, SPEP and complements.  Renal function is poor but stable-  non oliguric- not appearing to be uremic.  This seems like to me just progression of CKD in the setting of poorly controlled HTN, tobacco, etc 2. HTN/volume-  BP OK on norvasc 10, coreg 6.25 BID, aldactone 25 daily and lasix 40 iv q day -  keep as is for now-  says that he has to stay here til Monday to see the cardiologists 3. Anemia-  does have which argues for chronicity of his CKD-   iron low, will replete and add esa as well  4. Secondary hyperparathyroidism-  will check PTH   Cecille Aver    Labs: Basic Metabolic Panel: Recent Labs  Lab 11/04/22 0841 11/04/22 0901 11/05/22 0356  NA 140  --  137  K 3.8  --  3.5  CL 109  --  109  CO2 23  --  21*  GLUCOSE 96  --  90  BUN 60*  --  65*  CREATININE 5.80*  --  5.75*  CALCIUM 7.9*  --  7.7*  PHOS  --  4.3 3.8   Liver Function Tests: Recent Labs  Lab  11/05/22 0356  ALBUMIN 2.4*   No results for input(s): "LIPASE", "AMYLASE" in the last 168 hours. No results for input(s): "AMMONIA" in the last 168 hours. CBC: Recent Labs  Lab 11/04/22 0901 11/05/22 0356  WBC 5.5 4.9  NEUTROABS 3.5  --   HGB 10.7* 9.7*  HCT 31.1* 28.5*  MCV 92.0 91.6  PLT 198 178   Cardiac Enzymes: No results for input(s): "CKTOTAL", "CKMB", "CKMBINDEX", "TROPONINI" in the last 168 hours. CBG: No results for input(s): "GLUCAP" in the last 168 hours.  Iron Studies:  Recent Labs    11/04/22 0902  IRON 60  TIBC 233*  FERRITIN 227   Studies/Results: ECHOCARDIOGRAM COMPLETE  Result Date: 11/04/2022    ECHOCARDIOGRAM REPORT   Patient Name:   PRINCE COUEY Date of Exam: 11/04/2022 Medical Rec #:  [id number]       Height:       67.0 in Accession #:    [id number]      Weight:  160.0 lb Date of Birth:  February 06, 1961      BSA:          1.839 m Patient Age:    62 years        BP:           148/96 mmHg Patient Gender: M               HR:           84 bpm. [facility] Procedure: 2D Echo, 3D Echo, Cardiac Doppler, Color Doppler and Intracardiac            Opacification Agent Indications:    Cardiomegaly I51.7  History:        Patient has no prior history of Echocardiogram examinations.                 Cardiomegaly; Risk Factors:Current Smoker and Hypertension.  Sonographer:    Aron Baba Referring Phys: [id number] Cleora Fleet  Sonographer Comments: Image acquisition challenging due to respiratory motion. IMPRESSIONS  1. Left ventricular ejection fraction, by estimation, is 30 to 35%. The left ventricle has moderately decreased function. The left ventricle demonstrates global hypokinesis. The left ventricular internal cavity size was mildly to moderately dilated. There is mild concentric left ventricular hypertrophy. Left ventricular diastolic parameters are consistent with Grade II diastolic dysfunction (pseudonormalization).  2. Right ventricular systolic  function is low normal. The right ventricular size is normal. Tricuspid regurgitation signal is inadequate for assessing PA pressure.  3. Left atrial size was moderately dilated.  4. Right atrial size was mildly dilated.  5. A small pericardial effusion is present. The pericardial effusion is posterior to the left ventricle.  6. The mitral valve is grossly normal, mildly thickened. Mild mitral valve regurgitation.  7. The aortic valve is tricuspid. Aortic valve regurgitation is not visualized.  8. Aortic dilatation noted. There is moderate dilatation of the aortic root, measuring 43 mm.  9. The inferior vena cava is dilated in size with >50% respiratory variability, suggesting right atrial pressure of 8 mmHg. Comparison(s): No prior Echocardiogram. Consider hypertensive versus infiltrative cardiomyopathy. FINDINGS  Left Ventricle: Left ventricular ejection fraction, by estimation, is 30 to 35%. The left ventricle has moderately decreased function. The left ventricle demonstrates global hypokinesis. Definity contrast agent was given IV to delineate the left ventricular endocardial borders. The left ventricular internal cavity size was mildly to moderately dilated. There is mild concentric left ventricular hypertrophy. Abnormal (paradoxical) septal motion, consistent with left bundle branch block. Left ventricular diastolic parameters are consistent with Grade II diastolic dysfunction (pseudonormalization). Right Ventricle: The right ventricular size is normal. No increase in right ventricular wall thickness. Right ventricular systolic function is low normal. Tricuspid regurgitation signal is inadequate for assessing PA pressure. Left Atrium: Left atrial size was moderately dilated. Right Atrium: Right atrial size was mildly dilated. Pericardium: A small pericardial effusion is present. The pericardial effusion is posterior to the left ventricle. Mitral Valve: The mitral valve is grossly normal. There is mild  thickening of the mitral valve leaflet(s). Mild mitral valve regurgitation. Tricuspid Valve: The tricuspid valve is grossly normal. Tricuspid valve regurgitation is trivial. Aortic Valve: The aortic valve is tricuspid. Aortic valve regurgitation is not visualized. Pulmonic Valve: The pulmonic valve was grossly normal. Pulmonic valve regurgitation is trivial. Aorta: Aortic dilatation noted. There is moderate dilatation of the aortic root, measuring 43 mm. Venous: The inferior vena cava is dilated in size with greater than 50% respiratory variability, suggesting  right atrial pressure of 8 mmHg. IAS/Shunts: No atrial level shunt detected by color flow Doppler.  LEFT VENTRICLE PLAX 2D LVIDd:         6.00 cm   Diastology LVIDs:         5.10 cm   LV e' medial:    5.45 cm/s LV PW:         1.00 cm   LV E/e' medial:  19.1 LV IVS:        1.20 cm   LV e' lateral:   4.87 cm/s LVOT diam:     2.30 cm   LV E/e' lateral: 21.4 LV SV:         60 LV SV Index:   33 LVOT Area:     4.15 cm                           3D Volume EF:                          3D EF:        36 %                          LV EDV:       241 ml                          LV ESV:       154 ml                          LV SV:        87 ml RIGHT VENTRICLE RV S prime:     10.30 cm/s TAPSE (M-mode): 1.4 cm LEFT ATRIUM              Index        RIGHT ATRIUM           Index LA diam:        4.20 cm  2.28 cm/m   RA Area:     22.70 cm LA Vol (A2C):   114.0 ml 61.98 ml/m  RA Volume:   70.60 ml  38.38 ml/m LA Vol (A4C):   83.3 ml  45.29 ml/m LA Biplane Vol: 103.0 ml 56.00 ml/m  AORTIC VALVE             PULMONIC VALVE LVOT Vmax:   85.70 cm/s  PR End Diast Vel: 13.69 msec LVOT Vmean:  54.200 cm/s LVOT VTI:    0.144 m  AORTA Ao Root diam: 4.50 cm Ao Asc diam:  3.50 cm MITRAL VALVE MV Area (PHT): 4.86 cm     SHUNTS MV Decel Time: 156 msec     Systemic VTI:  0.14 m MR PISA:        0.25 cm    Systemic Diam: 2.30 cm MR PISA Radius: 0.20 cm MV E velocity: 104.00 cm/s MV A  velocity: 60.20 cm/s MV E/A ratio:  1.73 Nona Dell MD Electronically signed by Nona Dell MD Signature Date/Time: 11/04/2022/4:46:53 PM    Final    US RENAL  Result Date: 11/04/2022 CLINICAL DATA:  Renal failure EXAM: RENAL / URINARY TRACT ULTRASOUND COMPLETE COMPARISON:  None Available. FINDINGS: Right Kidney: Renal measurements: 10.0 x 3.9 x 4.8 cm = volume: 98.4 mL. Small anechoic structure seen towards the lower  pole measuring 12 mm. Adjacent similar 13 mm focus. Both these are simple appearing cysts. No collecting system dilatation or perinephric fluid. Right kidney appears slightly echogenic. Left Kidney: Renal measurements: 10.4 x 4.5 x 4.6 cm = volume: 113.4 mL. No collecting system dilatation. The kidney appears slightly echogenic. Simple cystic focus seen towards the upper pole measuring 9 mm. Bladder: Distended bladder with slight wall thickening Other: None. IMPRESSION: No collecting system dilatation. Simple bilateral renal cysts.  No specific imaging follow-up. Both kidneys appear slightly echogenic. Please correlate for any clinical evidence of chronic medical renal disease Electronically Signed   By: Karen Kays M.D.   On: 11/04/2022 10:29   Medications: Infusions:   Scheduled Medications:  amLODipine  10 mg Oral Daily   carvedilol  6.25 mg Oral BID WC   furosemide  40 mg Intravenous Daily   heparin  5,000 Units Subcutaneous Q8H   melatonin  3 mg Oral QHS   nicotine  21 mg Transdermal Daily   spironolactone  25 mg Oral Daily    have reviewed scheduled and prn medications.  Physical Exam: General:  NAD-  looks good Heart: RRR Lungs:mostly clear Abdomen: soft, non tender Extremities: min edema     11/05/2022,1:50 PM  LOS: 1 day

## 2022-11-06 DIAGNOSIS — I429 Cardiomyopathy, unspecified: Secondary | ICD-10-CM | POA: Diagnosis not present

## 2022-11-06 DIAGNOSIS — N179 Acute kidney failure, unspecified: Secondary | ICD-10-CM | POA: Diagnosis not present

## 2022-11-06 DIAGNOSIS — N1832 Chronic kidney disease, stage 3b: Secondary | ICD-10-CM | POA: Diagnosis not present

## 2022-11-06 DIAGNOSIS — D631 Anemia in chronic kidney disease: Secondary | ICD-10-CM | POA: Diagnosis not present

## 2022-11-06 LAB — RENAL FUNCTION PANEL
Albumin: 2.4 g/dL — ABNORMAL LOW (ref 3.5–5.0)
Anion gap: 8 (ref 5–15)
BUN: 70 mg/dL — ABNORMAL HIGH (ref 6–20)
CO2: 22 mmol/L (ref 22–32)
Calcium: 7.8 mg/dL — ABNORMAL LOW (ref 8.9–10.3)
Chloride: 108 mmol/L (ref 98–111)
Creatinine, Ser: 6 mg/dL — ABNORMAL HIGH (ref 0.61–1.24)
GFR, Estimated: 10 mL/min — ABNORMAL LOW (ref >60)
Glucose, Bld: 94 mg/dL (ref 70–99)
Phosphorus: 4.2 mg/dL (ref 2.5–4.6)
Potassium: 3.7 mmol/L (ref 3.5–5.1)
Sodium: 138 mmol/L (ref 135–145)

## 2022-11-06 LAB — C4 COMPLEMENT: Complement C4, Body Fluid: 29 mg/dL (ref 12–38)

## 2022-11-06 LAB — C3 COMPLEMENT: C3 Complement: 114 mg/dL (ref 82–167)

## 2022-11-06 NOTE — Progress Notes (Signed)
Pt labs, VS and UOP reviewed-    Kidney function stable to a little worse but UOP seems OK and BP is in a good place right now.  No change in plans/orders and I will see again in the AM   [facility] [employer]

## 2022-11-06 NOTE — Progress Notes (Signed)
PROGRESS NOTE   Damon Williams  ZOX:[id number] DOB: 1960/09/04 DOA: 11/04/2022 PCP: Patient, No Pcp Per   Chief Complaint  Patient presents with   Headache   Level of care: Telemetry  Brief Admission History:  62 year old gentleman longtime smoker with history of hypertension, history of back surgery, not on chronic medications or having regular medical follow-up reportedly has been monitoring blood pressure at home and noted very high systolic blood pressure readings at home in the 200/120 range.  He noticed that over the past several days he has had even higher blood pressure readings at home and decided to come into the emergency department because he has had increasing shortness of breath over the past couple of days.  He says that he is not able to lie flat due to severe breath and having to sleep sitting up due to shortness of breath.  He denies having chest pain symptoms.  He denies fever and chills.  He says that he has been taking acetaminophen tablets for chronic headaches.  He denies taking any NSAID medications over-the-counter including ibuprofen, Goody powder, aspirin, naproxen/Aleve.  He presented to the emergency department on 10/31/2022 complaining of shortness of breath.  He denied cough and leg swelling.  For some reason he ended up leaving before being seen by provider.  The patient says that he did make an appointment in the first or second week of May 2024 at the local [facility] clinic but has not yet established care.  He was noted to be hypertensive on arrival oxygen saturation 99% on room air, no chest pain symptoms.  His workup shows that his hemoglobin is 10.7 hematocrit 31.1 and his creatinine is 5.80 with a BUN of 60 and a potassium of 3.8.  Normal mag and Phos levels.  In August 2023 he had a creatinine of 1.96 and BUN of 28 at that time.  His hemoglobin was 13.1 at that time.  Nephrology was consulted by the ED provider who recommended patient be admitted for further  management and blood pressure control.  Patient was started on amlodipine in the ED.  Admission requested for further management.   Assessment and Plan:  AKI on CKD stage 3b (presumed) - likely exacerbated by poorly controlled BP in setting of heart failure and cardiorenal syndrome - follow up renal ultrasound - no obstruction found  - appreciate nephrology team consultation and recommendations  - renally dose medications as appropriate  - BP management with goal of normalization of BP over time - started amlodipine 5 mg daily, increased to 10 mg  - started carvedilol 6.25 mg BID  - 2 gram sodium restricted diet    Uncontrolled hypertension  - continue amlodipine 10 mg daily  - continue carvedilol 6.25 mg BID  - check lipid panel -- LDL 70 TC 130 HDL 31  - check TSH and vitamin D  - WNLs  - check A1c - pending - follow up urinalysis and urine tox screen (noted below)   Anemia in CKD  - check anemia panel including B12, folic acid, iron, TIBC, retic count - labs c/w anemia of chronic disease    New cardiomyopathy / Cardiomegaly  - suggested by CXR and findings on EKG - check 2D echocardiogram to further investigate - EF 30-35% global hypokinesis, grade 2 DD with small pericardial effusion - will ask cardiology team to see on 4/29 (not available at [facility] on weekends) - IV furosemide 40 mg daily started, pending response and renal function on 4/28 may  increase to BID dosing; - continue carvedilol 6.25 mg BID  - spironolactone 25 mg daily  - not a candidate for entresto now given creatinine >5  - not a cath candidate given CKD    HFrEF - cardiac BNP trending down  - TTE noted above EF 30-35%  - DC IV fluid - 2 gram sodium restricted diet ordered  - further recommendations to follow    Current Smoker - Nicotine patch 21 mg daily ordered - tobacco cessation counseling ordered - advised absolute smoking cessation   Cocaine Positive UDS - pt denies ever using cocaine, reports  girlfriend is a regular user - TOC consultation requested for social services     DVT prophylaxis: [place] heparin Code Status: Full  Family Communication: none present  Disposition: Status is: Inpatient Remains inpatient appropriate because: IV furosemide started 4/27   Consultants:  Nephrology Cardiology 4.29 Procedures:  TTE 4/26  Antimicrobials:    Subjective: Pt reports he continues frequent urination, his breathing is better, denies chest pains today.   Objective: Vitals:   11/06/22 0536 11/06/22 0749 11/06/22 1146 11/06/22 1305  BP: (!) 154/89 (!) 154/92 131/85 131/85  Pulse: 83 79 79 85  Resp: 15 16 16    Temp: 98.4 F (36.9 C) 98.6 F (37 C) 97.8 F (36.6 C)   TempSrc: Oral Oral Oral   SpO2: 98% 97% 98% 100%  Weight:      Height:        Intake/Output Summary (Last 24 hours) at 11/06/2022 1654 Last data filed at 11/06/2022 1300 Gross per 24 hour  Intake 1052.68 ml  Output 500 ml  Net 552.68 ml   Filed Weights   11/04/22 2350 11/06/22 0500  Weight: 72 kg 72.6 kg   Examination:  General exam: Appears calm and comfortable  Respiratory system: Clear to auscultation. Respiratory effort normal. Cardiovascular system: normal S1 & S2 heard. No JVD, murmurs, rubs, gallops or clicks. No pedal edema. Gastrointestinal system: Abdomen is nondistended, soft and nontender. No organomegaly or masses felt. Normal bowel sounds heard. Central nervous system: Alert and oriented. No focal neurological deficits. Extremities: Symmetric 5 x 5 power. Skin: No rashes, lesions or ulcers. Psychiatry: Judgement and insight appear normal. Mood & affect appropriate.   Data Reviewed: I have personally reviewed following labs and imaging studies  CBC: Recent Labs  Lab 11/04/22 0901 11/05/22 0356  WBC 5.5 4.9  NEUTROABS 3.5  --   HGB 10.7* 9.7*  HCT 31.1* 28.5*  MCV 92.0 91.6  PLT 198 178    Basic Metabolic Panel: Recent Labs  Lab 11/04/22 0841 11/04/22 0901 11/05/22 0356  11/06/22 0409  NA 140  --  137 138  K 3.8  --  3.5 3.7  CL 109  --  109 108  CO2 23  --  21* 22  GLUCOSE 96  --  90 94  BUN 60*  --  65* 70*  CREATININE 5.80*  --  5.75* 6.00*  CALCIUM 7.9*  --  7.7* 7.8*  MG  --  2.1 2.0  --   PHOS  --  4.3 3.8 4.2    CBG: No results for input(s): "GLUCAP" in the last 168 hours.  No results found for this or any previous visit (from the past 240 hour(s)).   Radiology Studies: No results found.  Scheduled Meds:  amLODipine  10 mg Oral Daily   carvedilol  6.25 mg Oral BID WC   furosemide  40 mg Intravenous Daily   heparin  5,000 Units Subcutaneous Q8H   melatonin  3 mg Oral QHS   nicotine  21 mg Transdermal Daily   spironolactone  25 mg Oral Daily   Continuous Infusions:  ferric gluconate (FERRLECIT) IVPB 250 mg (11/06/22 1038)     LOS: 2 days   Time spent: 35  mins  Richard Holz Laural Benes, MD How to contact the [facility] Attending or Consulting provider 7A - 7P or covering provider during after hours 7P -7A, for this patient?  Check the care team in [place] and look for a) attending/consulting TRH provider listed and b) the [facility] team listed Log into [email or web address] and use [place]'s universal password to access. If you do not have the password, please contact the hospital operator. Locate the [facility] provider you are looking for under [facility] and page to a number that you can be directly reached. If you still have difficulty reaching the provider, please page the [facility] (Director on Call) for the Hospitalists listed on amion for assistance.  11/06/2022, 4:54 PM

## 2022-11-06 NOTE — Plan of Care (Signed)
Patient AOX4, VSS throughout shift.  All meds given on time as ordered.  Denied pain and SOB.  Diminished lungs, IS encouraged.  Pt ambulated with steady gait to bathroom.  POC maintained, will continue to monitor.  Problem: Education: Goal: Knowledge of General Education information will improve Description: Including pain rating scale, medication(s)/side effects and non-pharmacologic comfort measures Outcome: Progressing   Problem: Health Behavior/Discharge Planning: Goal: Ability to manage health-related needs will improve Outcome: Progressing   Problem: Clinical Measurements: Goal: Ability to maintain clinical measurements within normal limits will improve Outcome: Progressing Goal: Will remain free from infection Outcome: Progressing Goal: Diagnostic test results will improve Outcome: Progressing Goal: Respiratory complications will improve Outcome: Progressing Goal: Cardiovascular complication will be avoided Outcome: Progressing   Problem: Activity: Goal: Risk for activity intolerance will decrease Outcome: Progressing   Problem: Nutrition: Goal: Adequate nutrition will be maintained Outcome: Progressing   Problem: Coping: Goal: Level of anxiety will decrease Outcome: Progressing   Problem: Elimination: Goal: Will not experience complications related to bowel motility Outcome: Progressing Goal: Will not experience complications related to urinary retention Outcome: Progressing   Problem: Pain Managment: Goal: General experience of comfort will improve Outcome: Progressing   Problem: Safety: Goal: Ability to remain free from injury will improve Outcome: Progressing   Problem: Skin Integrity: Goal: Risk for impaired skin integrity will decrease Outcome: Progressing

## 2022-11-07 ENCOUNTER — Encounter (HOSPITAL_COMMUNITY): Payer: Self-pay | Admitting: Family Medicine

## 2022-11-07 DIAGNOSIS — N179 Acute kidney failure, unspecified: Secondary | ICD-10-CM | POA: Diagnosis not present

## 2022-11-07 DIAGNOSIS — I5021 Acute systolic (congestive) heart failure: Secondary | ICD-10-CM

## 2022-11-07 DIAGNOSIS — I429 Cardiomyopathy, unspecified: Secondary | ICD-10-CM

## 2022-11-07 DIAGNOSIS — F1410 Cocaine abuse, uncomplicated: Secondary | ICD-10-CM

## 2022-11-07 DIAGNOSIS — N189 Chronic kidney disease, unspecified: Secondary | ICD-10-CM | POA: Diagnosis not present

## 2022-11-07 DIAGNOSIS — N1832 Chronic kidney disease, stage 3b: Secondary | ICD-10-CM | POA: Diagnosis not present

## 2022-11-07 DIAGNOSIS — D631 Anemia in chronic kidney disease: Secondary | ICD-10-CM | POA: Diagnosis not present

## 2022-11-07 LAB — RENAL FUNCTION PANEL
Albumin: 2.4 g/dL — ABNORMAL LOW (ref 3.5–5.0)
Anion gap: 9 (ref 5–15)
BUN: 80 mg/dL — ABNORMAL HIGH (ref 6–20)
CO2: 22 mmol/L (ref 22–32)
Calcium: 8.1 mg/dL — ABNORMAL LOW (ref 8.9–10.3)
Chloride: 107 mmol/L (ref 98–111)
Creatinine, Ser: 6.39 mg/dL — ABNORMAL HIGH (ref 0.61–1.24)
GFR, Estimated: 9 mL/min — ABNORMAL LOW (ref >60)
Glucose, Bld: 96 mg/dL (ref 70–99)
Phosphorus: 4.5 mg/dL (ref 2.5–4.6)
Potassium: 3.9 mmol/L (ref 3.5–5.1)
Sodium: 138 mmol/L (ref 135–145)

## 2022-11-07 LAB — KAPPA/LAMBDA LIGHT CHAINS
Kappa free light chain: 138.7 mg/L — ABNORMAL HIGH (ref 3.3–19.4)
Kappa, lambda light chain ratio: 1.19 (ref 0.26–1.65)
Lambda free light chains: 116.1 mg/L — ABNORMAL HIGH (ref 5.7–26.3)

## 2022-11-07 LAB — GLUCOSE, CAPILLARY: Glucose-Capillary: 115 mg/dL — ABNORMAL HIGH (ref 70–99)

## 2022-11-07 LAB — PROTEIN / CREATININE RATIO, URINE: Creatinine, Urine: 104 mg/dL

## 2022-11-07 LAB — TROPONIN I (HIGH SENSITIVITY): Troponin I (High Sensitivity): 41 ng/L — ABNORMAL HIGH (ref <18)

## 2022-11-07 MED ORDER — HYDRALAZINE HCL 25 MG PO TABS
25.0000 mg | ORAL_TABLET | Freq: Three times a day (TID) | ORAL | Status: DC
  Administered 2022-11-07 – 2022-11-08 (×4): 25 mg via ORAL
  Filled 2022-11-07 (×4): qty 1

## 2022-11-07 MED ORDER — ISOSORBIDE MONONITRATE ER 30 MG PO TB24
15.0000 mg | ORAL_TABLET | Freq: Every day | ORAL | Status: DC
  Administered 2022-11-07 – 2022-11-08 (×2): 15 mg via ORAL
  Filled 2022-11-07 (×2): qty 1

## 2022-11-07 NOTE — Progress Notes (Signed)
Subjective:  UOP at least 1100, he reports more-  BUN and crt worsening.   BP is fine-  not uremic  Objective Vital signs in last 24 hours: Vitals:   11/06/22 1305 11/06/22 1724 11/06/22 2125 11/07/22 0549  BP: 131/85 (!) 158/95 (!) 149/94 (!) 162/95  Pulse: 85 79 73 73  Resp:   18 18  Temp:   98 F (36.7 C) 97.9 F (36.6 C)  TempSrc:   Oral Oral  SpO2: 100%  98% 98%  Weight:    70.2 kg  Height:       Weight change: -2.4 kg  Intake/Output Summary (Last 24 hours) at 11/07/2022 1610 Last data filed at 11/07/2022 0548 Gross per 24 hour  Intake 480 ml  Output 1100 ml  Net -620 ml    Assessment/ Plan: Pt is a 62 y.o. 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 Renal-  noted to have a crt of 1.96 in August of 23-  is now in the high 5's/low 6's with HTN-  renal u/s 10+ cm kidneys-  slightly echogenic- U/A only 100 of protein, prt/crt ratio low-  does have 6-10 RBC/HPF-  ANA was pos but not sure the signif, complements normal-  SPEP still pending but do not have a high suspicion.   Renal function is poor and worsening-  non oliguric- not appearing to be uremic.  This seems like to me just progression of CKD in the setting of poorly controlled HTN, tobacco, etc and then some acute change due to the needed and appropriate change in BP from very bad to reasonable.  I hope we can achieve at least stability of renal function to be OK for discharge 2. HTN/volume-  BP OK on norvasc 10, coreg 6.25 BID, aldactone 25 daily and lasix 40 iv q day -  keep meds as is for now-  3. Anemia-  does have which argues for chronicity of his CKD-   iron low, repleting and have added esa as well  4. Secondary hyperparathyroidism-  will check PTH   Cecille Aver    Labs: Basic Metabolic Panel: Recent Labs  Lab 11/05/22 0356 11/06/22 0409 11/07/22 0432  NA 137 138 138  K 3.5 3.7 3.9  CL 109 108 107  CO2 21* 22  22  GLUCOSE 90 94 96  BUN 65* 70* 80*  CREATININE 5.75* 6.00* 6.39*  CALCIUM 7.7* 7.8* 8.1*  PHOS 3.8 4.2 4.5   Liver Function Tests: Recent Labs  Lab 11/05/22 0356 11/06/22 0409 11/07/22 0432  ALBUMIN 2.4* 2.4* 2.4*   No results for input(s): "LIPASE", "AMYLASE" in the last 168 hours. No results for input(s): "AMMONIA" in the last 168 hours. CBC: Recent Labs  Lab 11/04/22 0901 11/05/22 0356  WBC 5.5 4.9  NEUTROABS 3.5  --   HGB 10.7* 9.7*  HCT 31.1* 28.5*  MCV 92.0 91.6  PLT 198 178   Cardiac Enzymes: No results for input(s): "CKTOTAL", "CKMB", "CKMBINDEX", "TROPONINI" in the last 168 hours. CBG: No results for input(s): "GLUCAP" in the last 168 hours.  Iron Studies:  Recent Labs    11/04/22 0902  IRON 60  TIBC 233*  FERRITIN 227   Studies/Results: No results found. Medications: Infusions:  ferric gluconate (FERRLECIT) IVPB 250 mg (11/06/22 1038)    Scheduled Medications:  amLODipine  10 mg Oral Daily   carvedilol  6.25 mg Oral BID WC   furosemide  40 mg Intravenous Daily   heparin  5,000 Units Subcutaneous Q8H   melatonin  3 mg Oral QHS   nicotine  21 mg Transdermal Daily   spironolactone  25 mg Oral Daily    have reviewed scheduled and prn medications.  Physical Exam: General:  NAD-  looks good Heart: RRR Lungs:mostly clear Abdomen: soft, non tender Extremities: min edema     11/07/2022,7:18 AM  LOS: 3 days

## 2022-11-07 NOTE — Progress Notes (Signed)
PROGRESS NOTE   Damon Williams  WUJ:[id number] DOB: [birth year] DOA: 11/04/2022 PCP: Patient, No Pcp Per   Chief Complaint  Patient presents with   Headache   Level of care: Telemetry  Brief Admission History:  62 year old gentleman longtime smoker with history of hypertension, history of back surgery, not on chronic medications or having regular medical follow-up reportedly has been monitoring blood pressure at home and noted very high systolic blood pressure readings at home in the 200/120 range.  He noticed that over the past several days he has had even higher blood pressure readings at home and decided to come into the emergency department because he has had increasing shortness of breath over the past couple of days.  He says that he is not able to lie flat due to severe breath and having to sleep sitting up due to shortness of breath.  He denies having chest pain symptoms.  He denies fever and chills.  He says that he has been taking acetaminophen tablets for chronic headaches.  He denies taking any NSAID medications over-the-counter including ibuprofen, Goody powder, aspirin, naproxen/Aleve.  He presented to the emergency department on 10/31/2022 complaining of shortness of breath.  He denied cough and leg swelling.  For some reason he ended up leaving before being seen by provider.  The patient says that he did make an appointment in the first or second week of May 2024 at the local [facility] clinic but has not yet established care.  He was noted to be hypertensive on arrival oxygen saturation 99% on room air, no chest pain symptoms.  His workup shows that his hemoglobin is 10.7 hematocrit 31.1 and his creatinine is 5.80 with a BUN of 60 and a potassium of 3.8.  Normal mag and Phos levels.  In August 2023 he had a creatinine of 1.96 and BUN of 28 at that time.  His hemoglobin was 13.1 at that time.  Nephrology was consulted by the ED provider who recommended patient be admitted for further  management and blood pressure control.  Patient was started on amlodipine in the ED.  Admission requested for further management.   Assessment and Plan:  AKI on CKD stage IV - likely exacerbated by poorly controlled BP in setting of heart failure and cardiorenal syndrome - follow up renal ultrasound - no obstruction found  - appreciate nephrology team consultation and recommendations  - renally dose medications as appropriate  - BP management with goal of normalization of BP over time - started amlodipine 5 mg daily, increased to 10 mg  - started carvedilol 6.25 mg BID  - 2 gram sodium restricted diet  - cardiology team added hydralazine 25 mg TID and imdur 15 mg   Uncontrolled hypertension  - continue amlodipine 10 mg daily  - continue carvedilol 6.25 mg BID  - cardio team added hydralazine 25 mg TID and imdur 15 mg - check lipid panel -- LDL 70 TC 130 HDL 31  - check TSH and vitamin D  - WNLs  - check A1c - pending - follow up urinalysis and urine tox screen (noted below)   Anemia in CKD  - check anemia panel including B12, folic acid, iron, TIBC, retic count - labs c/w anemia of chronic disease    New cardiomyopathy / Cardiomegaly  - suggested by CXR and findings on EKG - check 2D echocardiogram to further investigate - EF 30-35% global hypokinesis, grade 2 DD with small pericardial effusion - will ask cardiology team to see  on 4/29 (not available at [facility] on weekends) - IV furosemide 40 mg daily started, pending response and renal function on 4/28 may increase to BID dosing; - continue carvedilol 6.25 mg BID  - not a candidate for entresto now given creatinine >6  - not a cath candidate given CKD    HFrEF - cardiac BNP trending down  - TTE noted above EF 30-35%  - DC IV fluid - 2 gram sodium restricted diet ordered  - further recommendations to follow    Filed Weights   11/04/22 2350 11/06/22 0500 11/07/22 0549  Weight: 72 kg 72.6 kg 70.2 kg    Intake/Output Summary  (Last 24 hours) at 11/07/2022 1719 Last data filed at 11/07/2022 1300 Gross per 24 hour  Intake 480 ml  Output 600 ml  Net -120 ml    Current Smoker - Nicotine patch 21 mg daily ordered - tobacco cessation counseling ordered - advised absolute smoking cessation   Cocaine Positive UDS - pt initially denied but now admits to intermittently using cocaine - TOC consultation requested for social services     DVT prophylaxis: [place] heparin Code Status: Full  Family Communication: none present  Disposition: Status is: Inpatient Remains inpatient appropriate because: IV furosemide started 4/27   Consultants:  Nephrology Cardiology 4.29 Procedures:  TTE 4/26  Antimicrobials:    Subjective: Pt is reporting no recent CP symptoms, no SOB, no dysuria, he denies nausea and loss of appetite    Objective: Vitals:   11/06/22 1724 11/06/22 2125 11/07/22 0549 11/07/22 1226  BP: (!) 158/95 (!) 149/94 (!) 162/95 123/73  Pulse: 79 73 73 71  Resp:  18 18 18   Temp:  98 F (36.7 C) 97.9 F (36.6 C) 97.9 F (36.6 C)  TempSrc:  Oral Oral Oral  SpO2:  98% 98% 100%  Weight:   70.2 kg   Height:        Intake/Output Summary (Last 24 hours) at 11/07/2022 1713 Last data filed at 11/07/2022 1300 Gross per 24 hour  Intake 480 ml  Output 600 ml  Net -120 ml   Filed Weights   11/04/22 2350 11/06/22 0500 11/07/22 0549  Weight: 72 kg 72.6 kg 70.2 kg   Examination:  General exam: Appears calm and comfortable  Respiratory system: Clear to auscultation. Respiratory effort normal. Cardiovascular system: normal S1 & S2 heard. No JVD, murmurs, rubs, gallops or clicks. No pedal edema. Gastrointestinal system: Abdomen is nondistended, soft and nontender. No organomegaly or masses felt. Normal bowel sounds heard. Central nervous system: Alert and oriented. No focal neurological deficits. Extremities: Symmetric 5 x 5 power. Skin: No rashes, lesions or ulcers. Psychiatry: Judgement and insight appear  normal. Mood & affect appropriate.   Data Reviewed: I have personally reviewed following labs and imaging studies  CBC: Recent Labs  Lab 11/04/22 0901 11/05/22 0356  WBC 5.5 4.9  NEUTROABS 3.5  --   HGB 10.7* 9.7*  HCT 31.1* 28.5*  MCV 92.0 91.6  PLT 198 178    Basic Metabolic Panel: Recent Labs  Lab 11/04/22 0841 11/04/22 0901 11/05/22 0356 11/06/22 0409 11/07/22 0432  NA 140  --  137 138 138  K 3.8  --  3.5 3.7 3.9  CL 109  --  109 108 107  CO2 23  --  21* 22 22  GLUCOSE 96  --  90 94 96  BUN 60*  --  65* 70* 80*  CREATININE 5.80*  --  5.75* 6.00* 6.39*  CALCIUM 7.9*  --  7.7* 7.8* 8.1*  MG  --  2.1 2.0  --   --   PHOS  --  4.3 3.8 4.2 4.5    CBG: Recent Labs  Lab 11/07/22 0741  GLUCAP 115*    No results found for this or any previous visit (from the past 240 hour(s)).   Radiology Studies: No results found.  Scheduled Meds:  amLODipine  10 mg Oral Daily   carvedilol  6.25 mg Oral BID WC   furosemide  40 mg Intravenous Daily   heparin  5,000 Units Subcutaneous Q8H   hydrALAZINE  25 mg Oral TID   isosorbide mononitrate  15 mg Oral Daily   melatonin  3 mg Oral QHS   nicotine  21 mg Transdermal Daily   Continuous Infusions:  ferric gluconate (FERRLECIT) IVPB 250 mg (11/07/22 0947)     LOS: 3 days   Time spent: 35  mins  Abrahm Mancia Laural Benes, MD How to contact the [facility] Attending or Consulting provider 7A - 7P or covering provider during after hours 7P -7A, for this patient?  Check the care team in [facility] and look for a) attending/consulting TRH provider listed and b) the [facility] team listed Log into [email or web address] and use [place]'s universal password to access. If you do not have the password, please contact the hospital operator. Locate the [facility] provider you are looking for under [facility] and page to a number that you can be directly reached. If you still have difficulty reaching the provider, please page the [facility] (Director on Call) for the  Hospitalists listed on amion for assistance.  11/07/2022, 5:13 PM

## 2022-11-07 NOTE — Consult Note (Addendum)
Cardiology Consultation   Patient ID: Damon Williams MRN: [id number]; DOB: 09/29/1960  Admit date: 11/04/2022 Date of Consult: 11/07/2022  PCP:  Patient, No Pcp Per   [facility] Providers Cardiologist:  New Click here to update MD or APP on Care Team, Refresh:1}     Patient Profile:   Damon Williams is a 62 y.o. male with a hx of HTN (not on medications), CKD stage 3b by 2023 labs, cocaine abuse, tobacco abuse, prior back surgery who is being seen 11/07/2022 for the evaluation of new cardiomyopathy at the request of Dr. Laural Benes.  History of Present Illness:   Mr. Belfield has no prior cardiac history. He has smoked for about 45 years, quit drinking in the 1980s, and reports episodic cocaine use. He does not regularly see a PCP but was scheduled to establish care in May with one. His creatinine was 1.96 in 2023. He presented to the hospital initially on 4/22 complaining of 2 nights of orthopnea and DOE. He LWBS due to the wait time. He returned to the ED on 11/04/22 due to continued orthopnea, found to have progressive renal failure and CHF on CXR. He had also felt some chest tightness the two days prior to admission. Initial BP 171/101. Admit Cr 5.80 rising to 6.39 today with BUN 80, albumin 2.4, anemia with Hgb 9-10 range, BNP 677, TSH wnl. CXR with cardiomegaly with mild interstitial pulmonary edema and trace right pleural effusion. Renal US showed simple bilateral renal cysts, echogenic kidneys possibly related to medical renal disease. He has been treated with amlodipine, carvedilol, IV Lasix, and spironolactone as well as IV Fluids (Stopped 4/27), IV iron. 2D echo 11/04/22 showed EF 30-35%, global HK, mild-moderate LV dilation, G2DD, low normal RVSF, moderate LAE, mildly dilated LA, small pericardial effusion, mild MR, moderate dilation of aortic root. He reports good UOP and improvement in breathing since starting the above. Nephrology is on board and suspects this is progression  of CKD in setting of poorly controlled HTN, tobacco, and the acute changes related to BP.    Past Medical History:  Diagnosis Date   CKD stage 3b, GFR 30-44 ml/min (HCC)    Cocaine use    Hypertension    Tobacco abuse     Past Surgical History:  Procedure Laterality Date   BACK SURGERY       Home Medications:  Prior to Admission medications   Medication Sig Start Date End Date Taking? Authorizing Provider  cephALEXin (KEFLEX) 500 MG capsule Take 1 capsule (500 mg total) by mouth 4 (four) times daily. Patient not taking: Reported on 01/02/2018 01/01/18   Burgess Amor, PA-C  cephALEXin (KEFLEX) 500 MG capsule Take 1 capsule (500 mg total) by mouth 4 (four) times daily. Patient not taking: Reported on 11/04/2022 01/02/18   Cathren Laine, MD  HYDROcodone-acetaminophen (NORCO/VICODIN) 5-325 MG tablet Take one tab po q 4 hrs prn pain Patient not taking: Reported on 11/04/2022 09/20/21   Triplett, Tammy, PA-C  methocarbamol (ROBAXIN) 500 MG tablet Take 1 tablet (500 mg total) by mouth 3 (three) times daily. Patient not taking: Reported on 11/04/2022 09/20/21   Pauline Aus, PA-C  predniSONE (DELTASONE) 10 MG tablet Take 6 tablets day one, 5 tablets day two, 4 tablets day three, 3 tablets day four, 2 tablets day five, then 1 tablet day six Patient not taking: Reported on 11/04/2022 10/09/19   Pauline Aus, PA-C    Inpatient Medications: Scheduled Meds:  amLODipine  10 mg Oral Daily  carvedilol  6.25 mg Oral BID WC   furosemide  40 mg Intravenous Daily   heparin  5,000 Units Subcutaneous Q8H   melatonin  3 mg Oral QHS   nicotine  21 mg Transdermal Daily   spironolactone  25 mg Oral Daily   Continuous Infusions:  ferric gluconate (FERRLECIT) IVPB 250 mg (11/06/22 1038)   PRN Meds: acetaminophen **OR** acetaminophen, ALPRAZolam, bisacodyl, fentaNYL (SUBLIMAZE) injection, ondansetron **OR** ondansetron (ZOFRAN) IV, oxyCODONE, traZODone  Allergies:   No Known Allergies  Social  History:   Social History   Socioeconomic History   Marital status: Married    Spouse name: Not on file   Number of children: Not on file   Years of education: Not on file   Highest education level: Not on file  Occupational History   Not on file  Tobacco Use   Smoking status: Every Day    Packs/day: 1.00    Years: 30.00    Additional pack years: 0.00    Total pack years: 30.00    Types: Cigarettes   Smokeless tobacco: Never  Vaping Use   Vaping Use: Never used  Substance and Sexual Activity   Alcohol use: Not Currently    Comment: quit 1989   Drug use: Yes    Types: Codeine   Sexual activity: Yes    Birth control/protection: None  Other Topics Concern   Not on file  Social History Narrative   Not on file   Social Determinants of Health   Financial Resource Strain: Not on file  Food Insecurity: Not on file  Transportation Needs: Not on file  Physical Activity: Not on file  Stress: Not on file  Social Connections: Not on file  Intimate Partner Violence: Not on file    Family History:   Family History  Problem Relation Age of Onset   Heart disease Neg Hx      ROS:  Please see the history of present illness.  All other ROS reviewed and negative.     Physical Exam/Data:   Vitals:   11/06/22 1305 11/06/22 1724 11/06/22 2125 11/07/22 0549  BP: 131/85 (!) 158/95 (!) 149/94 (!) 162/95  Pulse: 85 79 73 73  Resp:   18 18  Temp:   98 F (36.7 C) 97.9 F (36.6 C)  TempSrc:   Oral Oral  SpO2: 100%  98% 98%  Weight:    70.2 kg  Height:        Intake/Output Summary (Last 24 hours) at 11/07/2022 0909 Last data filed at 11/07/2022 0548 Gross per 24 hour  Intake 480 ml  Output 1100 ml  Net -620 ml      11/07/2022    5:49 AM 11/06/2022    5:00 AM 11/04/2022   11:50 PM  Last 3 Weights  Weight (lbs) 154 lb 12.2 oz 160 lb 0.9 oz 158 lb 11.7 oz  Weight (kg) 70.2 kg 72.6 kg 72 kg     Body mass index is 24.24 kg/m.  General: Well developed, well nourished, in  no acute distress. Head: Normocephalic, atraumatic, sclera non-icteric, no xanthomas, nares are without discharge. Poor dentition. Neck: Negative for carotid bruits. JVP not elevated. Lungs: Clear bilaterally to auscultation without wheezes, rales, or rhonchi. Breathing is unlabored. Heart: RRR S1 S2 without murmurs, rubs, or gallops.  Abdomen: Soft, non-tender, non-distended with normoactive bowel sounds. No rebound/guarding. Extremities: No clubbing or cyanosis. No edema. Distal pedal pulses are 2+ and equal bilaterally. Neuro: Alert and oriented X 3. Moves  all extremities spontaneously. Psych:  Responds to questions appropriately with a normal affect.   EKG:  The EKG was personally reviewed and demonstrates:  NSR 89bpm, suspected LVH with secondary repolarization changes with J pt pelevation V1-V4 with diffuse TWI otherwise, f/u tracings similar  Telemetry:  Telemetry was personally reviewed and demonstrates:  NSR, one 4 beat run NSVT  Relevant CV Studies: 2d echo 11/04/22   1. Left ventricular ejection fraction, by estimation, is 30 to 35%. The  left ventricle has moderately decreased function. The left ventricle  demonstrates global hypokinesis. The left ventricular internal cavity size  was mildly to moderately dilated.  There is mild concentric left ventricular hypertrophy. Left ventricular  diastolic parameters are consistent with Grade II diastolic dysfunction  (pseudonormalization).   2. Right ventricular systolic function is low normal. The right  ventricular size is normal. Tricuspid regurgitation signal is inadequate  for assessing PA pressure.   3. Left atrial size was moderately dilated.   4. Right atrial size was mildly dilated.   5. A small pericardial effusion is present. The pericardial effusion is  posterior to the left ventricle.   6. The mitral valve is grossly normal, mildly thickened. Mild mitral  valve regurgitation.   7. The aortic valve is tricuspid. Aortic  valve regurgitation is not  visualized.   8. Aortic dilatation noted. There is moderate dilatation of the aortic  root, measuring 43 mm.   9. The inferior vena cava is dilated in size with >50% respiratory  variability, suggesting right atrial pressure of 8 mmHg.   Comparison(s): No prior Echocardiogram. Consider hypertensive versus  infiltrative cardiomyopathy.   Laboratory Data:  High Sensitivity Troponin:  No results for input(s): "TROPONINIHS" in the last 720 hours.   Chemistry Recent Labs  Lab 11/04/22 0901 11/05/22 0356 11/06/22 0409 11/07/22 0432  NA  --  137 138 138  K  --  3.5 3.7 3.9  CL  --  109 108 107  CO2  --  21* 22 22  GLUCOSE  --  90 94 96  BUN  --  65* 70* 80*  CREATININE  --  5.75* 6.00* 6.39*  CALCIUM  --  7.7* 7.8* 8.1*  MG 2.1 2.0  --   --   GFRNONAA  --  11* 10* 9*  ANIONGAP  --  7 8 9     Recent Labs  Lab 11/05/22 0356 11/06/22 0409 11/07/22 0432  ALBUMIN 2.4* 2.4* 2.4*   Lipids  Recent Labs  Lab 11/04/22 0902  CHOL 130  TRIG 144  HDL 31*  LDLCALC 70  CHOLHDL 4.2    Hematology Recent Labs  Lab 11/04/22 0901 11/04/22 1028 11/05/22 0356  WBC 5.5  --  4.9  RBC 3.38* 3.66* 3.11*  HGB 10.7*  --  9.7*  HCT 31.1*  --  28.5*  MCV 92.0  --  91.6  MCH 31.7  --  31.2  MCHC 34.4  --  34.0  RDW 14.2  --  13.9  PLT 198  --  178   Thyroid  Recent Labs  Lab 11/04/22 0901  TSH 2.352    BNP Recent Labs  Lab 11/04/22 1112 11/05/22 0356  BNP 677.0* 564.0*    DDimer No results for input(s): "DDIMER" in the last 168 hours.   Radiology/Studies:  ECHOCARDIOGRAM COMPLETE  Result Date: 11/04/2022    ECHOCARDIOGRAM REPORT   Patient Name:   JAZEN SPRAGGINS Date of Exam: 11/04/2022 Medical Rec #:  [id number]  Height:       67.0 in Accession #:    [id number]      Weight:       160.0 lb Date of Birth:  11-06-1960      BSA:          1.839 m Patient Age:    61 years        BP:           148/96 mmHg Patient Gender: M               HR:            84 bpm. [facility] Procedure: 2D Echo, 3D Echo, Cardiac Doppler, Color Doppler and Intracardiac            Opacification Agent Indications:    Cardiomegaly I51.7  History:        Patient has no prior history of Echocardiogram examinations.                 Cardiomegaly; Risk Factors:Current Smoker and Hypertension.  Sonographer:    Aron Baba Referring Phys: [id number] Cleora Fleet  Sonographer Comments: Image acquisition challenging due to respiratory motion. IMPRESSIONS  1. Left ventricular ejection fraction, by estimation, is 30 to 35%. The left ventricle has moderately decreased function. The left ventricle demonstrates global hypokinesis. The left ventricular internal cavity size was mildly to moderately dilated. There is mild concentric left ventricular hypertrophy. Left ventricular diastolic parameters are consistent with Grade II diastolic dysfunction (pseudonormalization).  2. Right ventricular systolic function is low normal. The right ventricular size is normal. Tricuspid regurgitation signal is inadequate for assessing PA pressure.  3. Left atrial size was moderately dilated.  4. Right atrial size was mildly dilated.  5. A small pericardial effusion is present. The pericardial effusion is posterior to the left ventricle.  6. The mitral valve is grossly normal, mildly thickened. Mild mitral valve regurgitation.  7. The aortic valve is tricuspid. Aortic valve regurgitation is not visualized.  8. Aortic dilatation noted. There is moderate dilatation of the aortic root, measuring 43 mm.  9. The inferior vena cava is dilated in size with >50% respiratory variability, suggesting right atrial pressure of 8 mmHg. Comparison(s): No prior Echocardiogram. Consider hypertensive versus infiltrative cardiomyopathy. FINDINGS  Left Ventricle: Left ventricular ejection fraction, by estimation, is 30 to 35%. The left ventricle has moderately decreased function. The left ventricle demonstrates global  hypokinesis. Definity contrast agent was given IV to delineate the left ventricular endocardial borders. The left ventricular internal cavity size was mildly to moderately dilated. There is mild concentric left ventricular hypertrophy. Abnormal (paradoxical) septal motion, consistent with left bundle Darreon Lutes block. Left ventricular diastolic parameters are consistent with Grade II diastolic dysfunction (pseudonormalization). Right Ventricle: The right ventricular size is normal. No increase in right ventricular wall thickness. Right ventricular systolic function is low normal. Tricuspid regurgitation signal is inadequate for assessing PA pressure. Left Atrium: Left atrial size was moderately dilated. Right Atrium: Right atrial size was mildly dilated. Pericardium: A small pericardial effusion is present. The pericardial effusion is posterior to the left ventricle. Mitral Valve: The mitral valve is grossly normal. There is mild thickening of the mitral valve leaflet(s). Mild mitral valve regurgitation. Tricuspid Valve: The tricuspid valve is grossly normal. Tricuspid valve regurgitation is trivial. Aortic Valve: The aortic valve is tricuspid. Aortic valve regurgitation is not visualized. Pulmonic Valve: The pulmonic valve was grossly normal. Pulmonic valve regurgitation is trivial. Aorta: Aortic dilatation noted.  There is moderate dilatation of the aortic root, measuring 43 mm. Venous: The inferior vena cava is dilated in size with greater than 50% respiratory variability, suggesting right atrial pressure of 8 mmHg. IAS/Shunts: No atrial level shunt detected by color flow Doppler.  LEFT VENTRICLE PLAX 2D LVIDd:         6.00 cm   Diastology LVIDs:         5.10 cm   LV e' medial:    5.45 cm/s LV PW:         1.00 cm   LV E/e' medial:  19.1 LV IVS:        1.20 cm   LV e' lateral:   4.87 cm/s LVOT diam:     2.30 cm   LV E/e' lateral: 21.4 LV SV:         60 LV SV Index:   33 LVOT Area:     4.15 cm                            3D Volume EF:                          3D EF:        36 %                          LV EDV:       241 ml                          LV ESV:       154 ml                          LV SV:        87 ml RIGHT VENTRICLE RV S prime:     10.30 cm/s TAPSE (M-mode): 1.4 cm LEFT ATRIUM              Index        RIGHT ATRIUM           Index LA diam:        4.20 cm  2.28 cm/m   RA Area:     22.70 cm LA Vol (A2C):   114.0 ml 61.98 ml/m  RA Volume:   70.60 ml  38.38 ml/m LA Vol (A4C):   83.3 ml  45.29 ml/m LA Biplane Vol: 103.0 ml 56.00 ml/m  AORTIC VALVE             PULMONIC VALVE LVOT Vmax:   85.70 cm/s  PR End Diast Vel: 13.69 msec LVOT Vmean:  54.200 cm/s LVOT VTI:    0.144 m  AORTA Ao Root diam: 4.50 cm Ao Asc diam:  3.50 cm MITRAL VALVE MV Area (PHT): 4.86 cm     SHUNTS MV Decel Time: 156 msec     Systemic VTI:  0.14 m MR PISA:        0.25 cm    Systemic Diam: 2.30 cm MR PISA Radius: 0.20 cm MV E velocity: 104.00 cm/s MV A velocity: 60.20 cm/s MV E/A ratio:  1.73 Nona Dell MD Electronically signed by Nona Dell MD Signature Date/Time: 11/04/2022/4:46:53 PM    Final    US RENAL  Result Date: 11/04/2022 CLINICAL DATA:  Renal failure EXAM: RENAL / URINARY TRACT ULTRASOUND  COMPLETE COMPARISON:  None Available. FINDINGS: Right Kidney: Renal measurements: 10.0 x 3.9 x 4.8 cm = volume: 98.4 mL. Small anechoic structure seen towards the lower pole measuring 12 mm. Adjacent similar 13 mm focus. Both these are simple appearing cysts. No collecting system dilatation or perinephric fluid. Right kidney appears slightly echogenic. Left Kidney: Renal measurements: 10.4 x 4.5 x 4.6 cm = volume: 113.4 mL. No collecting system dilatation. The kidney appears slightly echogenic. Simple cystic focus seen towards the upper pole measuring 9 mm. Bladder: Distended bladder with slight wall thickening Other: None. IMPRESSION: No collecting system dilatation. Simple bilateral renal cysts.  No specific imaging follow-up. Both kidneys  appear slightly echogenic. Please correlate for any clinical evidence of chronic medical renal disease Electronically Signed   By: Karen Kays M.D.   On: 11/04/2022 10:29     Assessment and Plan:   1. AKI on CKD stage 3b with associated anemia, hypoalbuminemia - being co-managed by IM, nephrology  2. 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 Small pericardial effusion - follow clinically, no tamponade  4. Moderate dilation of aortic root - anticipate OP follow-up of this once renal status has been stabilized, cannot pursue CTA or MRA acutely with renal failure  5. Tobacco, cocaine use - counseled on importance of cessation, states he will discontinue use  6. NSVT - brief, 4 beats on tele - K 3.9 - very close to goal so would not replete given rising Cr, recent Mg all WNL  Risk Assessment/Risk Scores:      New York Heart Association (NYHA) Functional Class NYHA Class IV on arrival   For questions or updates, please contact [facility] Please consult [email or web address] for contact info under    Signed, Laurann Montana, PA-C  11/07/2022 9:09 AM  Attending note Patient seen and discussed with PA Dunn, I agree with her documentation. 62 yo  male history of tobacco abuse, HTN, presented initially to ER with high bp's 200s/120s from home. Reported progressing SOB, orthopnea.    K 3.8 Cr 5.8 (1.96 8 months ago) BUN 60 WBC 5.5 Hgb 10.7 Plt 198 Mg 2.1 TSH 2.3 LDL 70 UDS + cocaine ANA + BNP 677 Trop 41 CXR cardiomegaly, mild edema EKG SR, LVH strain pattern 10/2022 echo: LVE 30-35%, global hypokinesis, mild LVH, grade II dd, low normal RV fxn, mod LAE, aortic root 43 mm   1.Acute HFrEF - 10/2022 echo: LVE 30-35%, global hypokinesis, mild LVH, grade II dd, low normal RV fxn, mod LAE, aortic root 43 mm - severe HTN not on meds risk for HTN CM. Also cocaine + on admit could be playing a role. Cr would prohibit consideration for ishcemic testing at this time, no indication for urgent ischemic testing - I/Os incomplete, he is  on IV lasix 40mg  daily. We will defer diuretic dosing to neprhology  - medical therapy with coreg 6.25mg  bid. No ACE/ARB/ARNI given renal dysfunction. On aldactone, would avoid given renal function for this patient, I think there is a significnat risk of medical noncompliance with regular labs testing as well, high risk for hyperkalemia. D/c aldactone, start hydral 25mg  tid and imdur 15 mg daily  2.Substance abuse - cocaine + on admission  3. AKI on CKD - Cr 1.96 8 months ago, admitted with Cr 5.8 - followed by neprhology   Dina Rich MD

## 2022-11-07 NOTE — TOC Progression Note (Signed)
Transition of Care [facility]) - Progression Note    Patient Details  Name: Damon Williams MRN: [id number] Date of Birth: 11-10-1960  [facility]) CM/SW Contact  Karn Cassis, [place] Phone Number: 11/07/2022, 8:35 AM  Clinical Narrative:  TOC received consult for substance use. Pt positive for cocaine. Pt states he stopped using cocaine a month ago and denies any current substance use. Discussed resources with pt, but he declines. TOC will continue to follow.      Expected Discharge Plan: Home/Self Care Barriers to Discharge: Continued Medical Work up  Expected Discharge Plan and Services                                               Social Determinants of Health (SDOH) Interventions SDOH Screenings   Tobacco Use: High Risk (11/04/2022)    Readmission Risk Interventions     No data to display

## 2022-11-08 DIAGNOSIS — N1832 Chronic kidney disease, stage 3b: Secondary | ICD-10-CM | POA: Diagnosis not present

## 2022-11-08 DIAGNOSIS — N179 Acute kidney failure, unspecified: Secondary | ICD-10-CM | POA: Diagnosis not present

## 2022-11-08 DIAGNOSIS — I429 Cardiomyopathy, unspecified: Secondary | ICD-10-CM | POA: Diagnosis not present

## 2022-11-08 DIAGNOSIS — I5021 Acute systolic (congestive) heart failure: Secondary | ICD-10-CM | POA: Diagnosis not present

## 2022-11-08 DIAGNOSIS — N184 Chronic kidney disease, stage 4 (severe): Secondary | ICD-10-CM

## 2022-11-08 LAB — RENAL FUNCTION PANEL
Albumin: 2.4 g/dL — ABNORMAL LOW (ref 3.5–5.0)
Anion gap: 9 (ref 5–15)
BUN: 95 mg/dL — ABNORMAL HIGH (ref 6–20)
CO2: 22 mmol/L (ref 22–32)
Calcium: 8.1 mg/dL — ABNORMAL LOW (ref 8.9–10.3)
Chloride: 107 mmol/L (ref 98–111)
Creatinine, Ser: 7.16 mg/dL — ABNORMAL HIGH (ref 0.61–1.24)
GFR, Estimated: 8 mL/min — ABNORMAL LOW (ref >60)
Glucose, Bld: 87 mg/dL (ref 70–99)
Phosphorus: 4.7 mg/dL — ABNORMAL HIGH (ref 2.5–4.6)
Potassium: 4.5 mmol/L (ref 3.5–5.1)
Sodium: 138 mmol/L (ref 135–145)

## 2022-11-08 LAB — CBC
HCT: 28.3 % — ABNORMAL LOW (ref 39.0–52.0)
Hemoglobin: 9.5 g/dL — ABNORMAL LOW (ref 13.0–17.0)
MCH: 31 pg (ref 26.0–34.0)
MCHC: 33.6 g/dL (ref 30.0–36.0)
MCV: 92.5 fL (ref 80.0–100.0)
Platelets: 179 10*3/uL (ref 150–400)
RBC: 3.06 MIL/uL — ABNORMAL LOW (ref 4.22–5.81)
RDW: 14.3 % (ref 11.5–15.5)
WBC: 7.9 10*3/uL (ref 4.0–10.5)
nRBC: 0 % (ref 0.0–0.2)

## 2022-11-08 LAB — PROTEIN ELECTROPHORESIS, SERUM
A/G Ratio: 0.9 (ref 0.7–1.7)
Albumin ELP: 2.5 g/dL — ABNORMAL LOW (ref 2.9–4.4)
Alpha-1-Globulin: 0.3 g/dL (ref 0.0–0.4)
Alpha-2-Globulin: 0.6 g/dL (ref 0.4–1.0)
Beta Globulin: 0.8 g/dL (ref 0.7–1.3)
Gamma Globulin: 1 g/dL (ref 0.4–1.8)
Globulin, Total: 2.7 g/dL (ref 2.2–3.9)
M-Spike, %: 0.3 g/dL — ABNORMAL HIGH
Total Protein ELP: 5.2 g/dL — ABNORMAL LOW (ref 6.0–8.5)

## 2022-11-08 MED ORDER — ISOSORBIDE MONONITRATE ER 30 MG PO TB24: 15.0000 mg | ORAL_TABLET | Freq: Every day | ORAL | 2 refills | Status: DC

## 2022-11-08 MED ORDER — CARVEDILOL 6.25 MG PO TABS: 6.2500 mg | ORAL_TABLET | Freq: Two times a day (BID) | ORAL | 2 refills | Status: DC

## 2022-11-08 MED ORDER — HYDRALAZINE HCL 25 MG PO TABS: 25.0000 mg | ORAL_TABLET | Freq: Three times a day (TID) | ORAL | 2 refills | Status: DC

## 2022-11-08 MED ORDER — AMLODIPINE BESYLATE 10 MG PO TABS: 10.0000 mg | ORAL_TABLET | Freq: Every day | ORAL | 2 refills | Status: DC

## 2022-11-08 NOTE — Discharge Summary (Signed)
Physician Discharge Summary  Damon Williams JWJ:[id number] DOB: May 27, 1961 DOA: 11/04/2022  Cardiology: [facility] Nephrology: [facility]  Admit date: 11/04/2022 Discharge date: 11/08/2022  Admitted From:  Home  Disposition: Home   Recommendations for Outpatient Follow-up:  Follow up with [facility] on 11/14/22 as already arranged  Follow up with cardiology on 12/06/22 as scheduled  Please repeat BMP on 11/14/22 at appt with [facility]  Please establish care with primary care provider in 2 weeks  Pt advised to return to hospital for any signs or symptoms of uremia  Discharge Condition: STABLE   CODE STATUS: FULL DIET: 2 gram sodium renal with fluid restriction recommended    Brief Hospitalization Summary: Please see all hospital notes, images, labs for full details of the hospitalization. ADMISSION HPI:  62 year old gentleman longtime smoker with history of hypertension, history of back surgery, not on chronic medications or having regular medical follow-up reportedly has been monitoring blood pressure at home and noted very high systolic blood pressure readings at home in the 200/120 range.  He noticed that over the past several days he has had even higher blood pressure readings at home and decided to come into the emergency department because he has had increasing shortness of breath over the past couple of days.  He says that he is not able to lie flat due to severe breath and having to sleep sitting up due to shortness of breath.  He denies having chest pain symptoms.  He denies fever and chills.  He says that he has been taking acetaminophen tablets for chronic headaches.  He denies taking any NSAID medications over-the-counter including ibuprofen, Goody powder, aspirin, naproxen/Aleve.  He presented to the emergency department on 10/31/2022 complaining of shortness of breath.  He denied cough and leg swelling.  For some reason he ended up  leaving before being seen by provider.  The patient says that he did make an appointment in the first or second week of May 2024 at the local [facility] clinic but has not yet established care.  He was noted to be hypertensive on arrival oxygen saturation 99% on room air, no chest pain symptoms.  His workup shows that his hemoglobin is 10.7 hematocrit 31.1 and his creatinine is 5.80 with a BUN of 60 and a potassium of 3.8.  Normal mag and Phos levels.  In August 2023 he had a creatinine of 1.96 and BUN of 28 at that time.  His hemoglobin was 13.1 at that time.  Nephrology was consulted by the ED provider who recommended patient be admitted for further management and blood pressure control.  Patient was started on amlodipine in the ED.  Admission requested for further management.  Hospital Course by problem list   AKI on CKD stage IV - likely exacerbated by poorly controlled BP in setting of heart failure and cardiorenal syndrome - follow up renal ultrasound - no obstruction found  - appreciate nephrology team consultation and recommendations  - renally dose medications as appropriate  - BP management with goal of normalization of BP over time - started amlodipine 5 mg daily, increased to 10 mg  - started carvedilol 6.25 mg BID  - 2 gram sodium restricted diet  - cardiology team added hydralazine 25 mg TID and imdur 15 mg - nephrology arranged for close outpatient follow up for 11/14/22    Uncontrolled hypertension  - continue amlodipine 10 mg daily  - continue carvedilol 6.25 mg BID  - cardio team added  hydralazine 25 mg TID and imdur 15 mg - check lipid panel -- LDL 70 TC 130 HDL 31  - check TSH and vitamin D  - WNLs  - check A1c - pending - follow up urinalysis and urine tox screen (noted below)   Anemia in CKD  - check anemia panel including B12, folic acid, iron, TIBC, retic count - labs c/w anemia of chronic disease    New cardiomyopathy / Cardiomegaly  - suggested by CXR and findings  on EKG - check 2D echocardiogram to further investigate - EF 30-35% global hypokinesis, grade 2 DD with small pericardial effusion - will ask cardiology team to see on 4/29 (not available at [facility] on weekends) - IV furosemide 40 mg daily started, pending response and renal function on 4/28 may increase to BID dosing; - continue carvedilol 6.25 mg BID  - not a candidate for entresto now given creatinine >6  - not a cath candidate given CKD    HFrEF - cardiac BNP trending down  - TTE noted above EF 30-35%  - DC IV fluid - 2 gram sodium restricted diet ordered  - further recommendations to follow  - outpatient follow up with cardiology scheduled for 12/06/22         Filed Weights    11/04/22 2350 11/06/22 0500 11/07/22 0549  Weight: 72 kg 72.6 kg 70.2 kg     Current Smoker - Nicotine patch 21 mg daily ordered - tobacco cessation counseling ordered - advised absolute smoking cessation    Cocaine Positive UDS - pt initially denied but now admits to intermittently using cocaine - TOC consultation requested for social services  - Pt says that he will stop any further use   Discharge Diagnoses:  Principal Problem:   Acute renal failure (ARF) (HCC) Active Problems:   Cardiomyopathy (HCC)   Cardiomegaly   Uncontrolled hypertension   Anemia in chronic kidney disease (CKD)   CKD (chronic kidney disease)   Current smoker   Discharge Instructions:  Allergies as of 11/08/2022   No Known Allergies      Medication List     STOP taking these medications    cephALEXin 500 MG capsule Commonly known as: Keflex   HYDROcodone-acetaminophen 5-325 MG tablet Commonly known as: NORCO/VICODIN   methocarbamol 500 MG tablet Commonly known as: ROBAXIN   predniSONE 10 MG tablet Commonly known as: DELTASONE       TAKE these medications    amLODipine 10 MG tablet Commonly known as: NORVASC Take 1 tablet (10 mg total) by mouth daily. Start taking on: Nov 09, 2022   carvedilol  6.25 MG tablet Commonly known as: COREG Take 1 tablet (6.25 mg total) by mouth 2 (two) times daily with a meal.   hydrALAZINE 25 MG tablet Commonly known as: APRESOLINE Take 1 tablet (25 mg total) by mouth 3 (three) times daily.   isosorbide mononitrate 30 MG 24 hr tablet Commonly known as: IMDUR Take 0.5 tablets (15 mg total) by mouth daily. Start taking on: Nov 09, 2022        Follow-up Information     Leone Brand, NP Follow up on 12/06/2022.   Specialties: Cardiology, Radiology Why: Cardiology Hospital Follow-up on 12/06/2022 at 3:30 PM. Contact information: [address] [place] [address] [phone]         [facility]. Go on 11/14/2022.   Why: They will call you with your appointment times  No Known Allergies Allergies as of 11/08/2022   No Known Allergies      Medication List     STOP taking these medications    cephALEXin 500 MG capsule Commonly known as: Keflex   HYDROcodone-acetaminophen 5-325 MG tablet Commonly known as: NORCO/VICODIN   methocarbamol 500 MG tablet Commonly known as: ROBAXIN   predniSONE 10 MG tablet Commonly known as: DELTASONE       TAKE these medications    amLODipine 10 MG tablet Commonly known as: NORVASC Take 1 tablet (10 mg total) by mouth daily. Start taking on: Nov 09, 2022   carvedilol 6.25 MG tablet Commonly known as: COREG Take 1 tablet (6.25 mg total) by mouth 2 (two) times daily with a meal.   hydrALAZINE 25 MG tablet Commonly known as: APRESOLINE Take 1 tablet (25 mg total) by mouth 3 (three) times daily.   isosorbide mononitrate 30 MG 24 hr tablet Commonly known as: IMDUR Take 0.5 tablets (15 mg total) by mouth daily. Start taking on: Nov 09, 2022        Procedures/Studies: ECHOCARDIOGRAM COMPLETE  Result Date: 11/04/2022    ECHOCARDIOGRAM REPORT   Patient Name:   Damon Williams Date of Exam: 11/04/2022 Medical Rec #:  [id number]       Height:       67.0  in Accession #:    [id number]      Weight:       160.0 lb Date of Birth:  1961-03-27      BSA:          1.839 m Patient Age:    61 years        BP:           148/96 mmHg Patient Gender: M               HR:           84 bpm. [facility] Procedure: 2D Echo, 3D Echo, Cardiac Doppler, Color Doppler and Intracardiac            Opacification Agent Indications:    Cardiomegaly I51.7  History:        Patient has no prior history of Echocardiogram examinations.                 Cardiomegaly; Risk Factors:Current Smoker and Hypertension.  Sonographer:    Aron Baba Referring Phys: [id number] Cleora Fleet  Sonographer Comments: Image acquisition challenging due to respiratory motion. IMPRESSIONS  1. Left ventricular ejection fraction, by estimation, is 30 to 35%. The left ventricle has moderately decreased function. The left ventricle demonstrates global hypokinesis. The left ventricular internal cavity size was mildly to moderately dilated. There is mild concentric left ventricular hypertrophy. Left ventricular diastolic parameters are consistent with Grade II diastolic dysfunction (pseudonormalization).  2. Right ventricular systolic function is low normal. The right ventricular size is normal. Tricuspid regurgitation signal is inadequate for assessing PA pressure.  3. Left atrial size was moderately dilated.  4. Right atrial size was mildly dilated.  5. A small pericardial effusion is present. The pericardial effusion is posterior to the left ventricle.  6. The mitral valve is grossly normal, mildly thickened. Mild mitral valve regurgitation.  7. The aortic valve is tricuspid. Aortic valve regurgitation is not visualized.  8. Aortic dilatation noted. There is moderate dilatation of the aortic root, measuring 43 mm.  9. The inferior vena cava is dilated in size with >50% respiratory variability, suggesting right atrial  pressure of 8 mmHg. Comparison(s): No prior Echocardiogram. Consider hypertensive versus  infiltrative cardiomyopathy. FINDINGS  Left Ventricle: Left ventricular ejection fraction, by estimation, is 30 to 35%. The left ventricle has moderately decreased function. The left ventricle demonstrates global hypokinesis. Definity contrast agent was given IV to delineate the left ventricular endocardial borders. The left ventricular internal cavity size was mildly to moderately dilated. There is mild concentric left ventricular hypertrophy. Abnormal (paradoxical) septal motion, consistent with left bundle branch block. Left ventricular diastolic parameters are consistent with Grade II diastolic dysfunction (pseudonormalization). Right Ventricle: The right ventricular size is normal. No increase in right ventricular wall thickness. Right ventricular systolic function is low normal. Tricuspid regurgitation signal is inadequate for assessing PA pressure. Left Atrium: Left atrial size was moderately dilated. Right Atrium: Right atrial size was mildly dilated. Pericardium: A small pericardial effusion is present. The pericardial effusion is posterior to the left ventricle. Mitral Valve: The mitral valve is grossly normal. There is mild thickening of the mitral valve leaflet(s). Mild mitral valve regurgitation. Tricuspid Valve: The tricuspid valve is grossly normal. Tricuspid valve regurgitation is trivial. Aortic Valve: The aortic valve is tricuspid. Aortic valve regurgitation is not visualized. Pulmonic Valve: The pulmonic valve was grossly normal. Pulmonic valve regurgitation is trivial. Aorta: Aortic dilatation noted. There is moderate dilatation of the aortic root, measuring 43 mm. Venous: The inferior vena cava is dilated in size with greater than 50% respiratory variability, suggesting right atrial pressure of 8 mmHg. IAS/Shunts: No atrial level shunt detected by color flow Doppler.  LEFT VENTRICLE PLAX 2D LVIDd:         6.00 cm   Diastology LVIDs:         5.10 cm   LV e' medial:    5.45 cm/s LV PW:          1.00 cm   LV E/e' medial:  19.1 LV IVS:        1.20 cm   LV e' lateral:   4.87 cm/s LVOT diam:     2.30 cm   LV E/e' lateral: 21.4 LV SV:         60 LV SV Index:   33 LVOT Area:     4.15 cm                           3D Volume EF:                          3D EF:        36 %                          LV EDV:       241 ml                          LV ESV:       154 ml                          LV SV:        87 ml RIGHT VENTRICLE RV S prime:     10.30 cm/s TAPSE (M-mode): 1.4 cm LEFT ATRIUM              Index        RIGHT ATRIUM  Index LA diam:        4.20 cm  2.28 cm/m   RA Area:     22.70 cm LA Vol (A2C):   114.0 ml 61.98 ml/m  RA Volume:   70.60 ml  38.38 ml/m LA Vol (A4C):   83.3 ml  45.29 ml/m LA Biplane Vol: 103.0 ml 56.00 ml/m  AORTIC VALVE             PULMONIC VALVE LVOT Vmax:   85.70 cm/s  PR End Diast Vel: 13.69 msec LVOT Vmean:  54.200 cm/s LVOT VTI:    0.144 m  AORTA Ao Root diam: 4.50 cm Ao Asc diam:  3.50 cm MITRAL VALVE MV Area (PHT): 4.86 cm     SHUNTS MV Decel Time: 156 msec     Systemic VTI:  0.14 m MR PISA:        0.25 cm    Systemic Diam: 2.30 cm MR PISA Radius: 0.20 cm MV E velocity: 104.00 cm/s MV A velocity: 60.20 cm/s MV E/A ratio:  1.73 Nona Dell MD Electronically signed by Nona Dell MD Signature Date/Time: 11/04/2022/4:46:53 PM    Final    US RENAL  Result Date: 11/04/2022 CLINICAL DATA:  Renal failure EXAM: RENAL / URINARY TRACT ULTRASOUND COMPLETE COMPARISON:  None Available. FINDINGS: Right Kidney: Renal measurements: 10.0 x 3.9 x 4.8 cm = volume: 98.4 mL. Small anechoic structure seen towards the lower pole measuring 12 mm. Adjacent similar 13 mm focus. Both these are simple appearing cysts. No collecting system dilatation or perinephric fluid. Right kidney appears slightly echogenic. Left Kidney: Renal measurements: 10.4 x 4.5 x 4.6 cm = volume: 113.4 mL. No collecting system dilatation. The kidney appears slightly echogenic. Simple cystic focus seen towards  the upper pole measuring 9 mm. Bladder: Distended bladder with slight wall thickening Other: None. IMPRESSION: No collecting system dilatation. Simple bilateral renal cysts.  No specific imaging follow-up. Both kidneys appear slightly echogenic. Please correlate for any clinical evidence of chronic medical renal disease Electronically Signed   By: Karen Kays M.D.   On: 11/04/2022 10:29   DG Chest 2 View  Result Date: 10/31/2022 CLINICAL DATA:  Shortness of breath. EXAM: CHEST - 2 VIEW COMPARISON:  10/02/2006. FINDINGS: New cardiomegaly and mild interstitial pulmonary edema with trace right pleural effusion. No pneumothorax. Visualized bones and upper abdomen are unremarkable. IMPRESSION: Cardiomegaly with mild interstitial pulmonary edema and trace right pleural effusion. Electronically Signed   By: Orvan Falconer M.D.   On: 10/31/2022 11:40     Subjective: Pt reports that he is going home today, not willing to stay another day, he says he will follow up as requested and he will take his medications as prescribed.  He says he will stop cigarettes and stop cocaine use.    Discharge Exam: Vitals:   11/07/22 2121 11/08/22 0519  BP: 131/84 (!) 136/98  Pulse: 81 82  Resp: 20 18  Temp: 98.8 F (37.1 C) 98.6 F (37 C)  SpO2: 99% 100%   Vitals:   11/07/22 0549 11/07/22 1226 11/07/22 2121 11/08/22 0519  BP: (!) 162/95 123/73 131/84 (!) 136/98  Pulse: 73 71 81 82  Resp: 18 18 20 18   Temp: 97.9 F (36.6 C) 97.9 F (36.6 C) 98.8 F (37.1 C) 98.6 F (37 C)  TempSrc: Oral Oral    SpO2: 98% 100% 99% 100%  Weight: 70.2 kg   70.1 kg  Height:        General: Pt is alert,  awake, not in acute distress Cardiovascular: RRR, S1/S2 +, no rubs, no gallops Respiratory: CTA bilaterally, no wheezing, no rhonchi Abdominal: Soft, NT, ND, bowel sounds + Extremities: no edema, no cyanosis   The results of significant diagnostics from this hospitalization (including imaging, microbiology, ancillary and  laboratory) are listed below for reference.     Microbiology: No results found for this or any previous visit (from the past 240 hour(s)).   Labs: BNP (last 3 results) Recent Labs    11/04/22 1112 11/05/22 0356  BNP 677.0* 564.0*   Basic Metabolic Panel: Recent Labs  Lab 11/04/22 0841 11/04/22 0901 11/05/22 0356 11/06/22 0409 11/07/22 0432 11/08/22 0430  NA 140  --  137 138 138 138  K 3.8  --  3.5 3.7 3.9 4.5  CL 109  --  109 108 107 107  CO2 23  --  21* 22 22 22   GLUCOSE 96  --  90 94 96 87  BUN 60*  --  65* 70* 80* 95*  CREATININE 5.80*  --  5.75* 6.00* 6.39* 7.16*  CALCIUM 7.9*  --  7.7* 7.8* 8.1* 8.1*  MG  --  2.1 2.0  --   --   --   PHOS  --  4.3 3.8 4.2 4.5 4.7*   Liver Function Tests: Recent Labs  Lab 11/05/22 0356 11/06/22 0409 11/07/22 0432 11/08/22 0430  ALBUMIN 2.4* 2.4* 2.4* 2.4*   No results for input(s): "LIPASE", "AMYLASE" in the last 168 hours. No results for input(s): "AMMONIA" in the last 168 hours. CBC: Recent Labs  Lab 11/04/22 0901 11/05/22 0356 11/08/22 0430  WBC 5.5 4.9 7.9  NEUTROABS 3.5  --   --   HGB 10.7* 9.7* 9.5*  HCT 31.1* 28.5* 28.3*  MCV 92.0 91.6 92.5  PLT 198 178 179   Cardiac Enzymes: No results for input(s): "CKTOTAL", "CKMB", "CKMBINDEX", "TROPONINI" in the last 168 hours. BNP: Invalid input(s): "POCBNP" CBG: Recent Labs  Lab 11/07/22 0741  GLUCAP 115*   D-Dimer No results for input(s): "DDIMER" in the last 72 hours. Hgb A1c No results for input(s): "HGBA1C" in the last 72 hours. Lipid Profile No results for input(s): "CHOL", "HDL", "LDLCALC", "TRIG", "CHOLHDL", "LDLDIRECT" in the last 72 hours. Thyroid function studies No results for input(s): "TSH", "T4TOTAL", "T3FREE", "THYROIDAB" in the last 72 hours.  Invalid input(s): "FREET3" Anemia work up No results for input(s): "VITAMINB12", "FOLATE", "FERRITIN", "TIBC", "IRON", "RETICCTPCT" in the last 72 hours. Urinalysis    Component Value Date/Time    COLORURINE STRAW (A) 11/04/2022 1226   APPEARANCEUR CLEAR 11/04/2022 1226   LABSPEC 1.011 11/04/2022 1226   PHURINE 5.0 11/04/2022 1226   GLUCOSEU NEGATIVE 11/04/2022 1226   HGBUR SMALL (A) 11/04/2022 1226   BILIRUBINUR NEGATIVE 11/04/2022 1226   KETONESUR NEGATIVE 11/04/2022 1226   PROTEINUR 100 (A) 11/04/2022 1226   NITRITE NEGATIVE 11/04/2022 1226   LEUKOCYTESUR NEGATIVE 11/04/2022 1226   Sepsis Labs Recent Labs  Lab 11/04/22 0901 11/05/22 0356 11/08/22 0430  WBC 5.5 4.9 7.9   Microbiology No results found for this or any previous visit (from the past 240 hour(s)).  Time coordinating discharge: 38 mins   SIGNED:  Standley Dakins, MD  [facility] 11/08/2022, 11:35 AM How to contact the [facility] Attending or Consulting provider 7A - 7P or covering provider during after hours 7P -7A, for this patient?  Check the care team in [facility] and look for a) attending/consulting [facility] provider listed and b) the [facility] team listed Log into  [email or web address] and use [place]'s universal password to access. If you do not have the password, please contact the hospital operator. Locate the [facility] provider you are looking for under [facility] and page to a number that you can be directly reached. If you still have difficulty reaching the provider, please page the [facility] (Director on Call) for the Hospitalists listed on amion for assistance.

## 2022-11-08 NOTE — Progress Notes (Signed)
Subjective:  UOP at least 800,  BUN and crt worsening.   BP is fine, got started on hydralazine per cards-  had one reading 123/73-    not uremic  Objective Vital signs in last 24 hours: Vitals:   11/07/22 0549 11/07/22 1226 11/07/22 2121 11/08/22 0519  BP: (!) 162/95 123/73 131/84 (!) 136/98  Pulse: 73 71 81 82  Resp: 18 18 20 18   Temp: 97.9 F (36.6 C) 97.9 F (36.6 C) 98.8 F (37.1 C) 98.6 F (37 C)  TempSrc: Oral Oral    SpO2: 98% 100% 99% 100%  Weight: 70.2 kg   70.1 kg  Height:       Weight change: -0.1 kg  Intake/Output Summary (Last 24 hours) at 11/08/2022 0856 Last data filed at 11/08/2022 0559 Gross per 24 hour  Intake 1200 ml  Output 800 ml  Net 400 ml    Assessment/ Plan: Pt is a 62 y.o. 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 Renal-  noted to have a crt of 1.96 in August of 23-  is now in the high 5's/low 6's with HTN-  renal u/s 10+ cm kidneys-  slightly echogenic- U/A only 100 of protein, prt/crt ratio 1.5 grams-  does have 6-10 RBC/HPF-  ANA was pos but not sure the signif, complements normal-  SPEP still pending but do not have a high suspicion.   Renal function is poor and worsening-  non oliguric- not appearing to be uremic.  This seems like to me just progression of CKD in the setting of poorly controlled HTN, tobacco, etc and then some acute change due to the needed and appropriate change in BP from very bad to reasonable.  I was hoping we could achieve at least stability of renal function to be OK for discharge.  We are still trending in wrong direction -  no absolute indications for dialysis but we are getting closer.  He will not c/o any uremic sxms-  says he was told he could go home.  I dont feel fantastic about sending someone out with a rising crt but he looks really good and I think he understands the importance of things-  will get him an appt on Monday at the  [place] office where can follow up on labs 2. HTN/volume-  BP was OK on norvasc 10, coreg 6.25 BID, aldactone 25 daily and lasix 40 iv q day -  now aldactone and lasix have been stopped and hydralazine has been started.  True that his volume status seems good -  BP now well controlled 3. Anemia-  does have which argues for chronicity of his CKD-   iron low, repleting and have given esa as well  4. Secondary hyperparathyroidism-  will check PTH ( pending)    Cecille Aver    Labs: Basic Metabolic Panel: Recent Labs  Lab 11/06/22 0409 11/07/22 0432 11/08/22 0430  NA 138 138 138  K 3.7 3.9 4.5  CL 108 107 107  CO2 22 22 22   GLUCOSE 94 96 87  BUN 70* 80* 95*  CREATININE 6.00* 6.39* 7.16*  CALCIUM 7.8* 8.1* 8.1*  PHOS 4.2 4.5 4.7*   Liver Function Tests: Recent Labs  Lab 11/06/22 0409 11/07/22 0432 11/08/22 0430  ALBUMIN 2.4* 2.4* 2.4*   No results for input(s): "LIPASE", "AMYLASE" in the last 168 hours. No results for input(s): "AMMONIA" in the last 168 hours. CBC: Recent  Labs  Lab 11/04/22 0901 11/05/22 0356 11/08/22 0430  WBC 5.5 4.9 7.9  NEUTROABS 3.5  --   --   HGB 10.7* 9.7* 9.5*  HCT 31.1* 28.5* 28.3*  MCV 92.0 91.6 92.5  PLT 198 178 179   Cardiac Enzymes: No results for input(s): "CKTOTAL", "CKMB", "CKMBINDEX", "TROPONINI" in the last 168 hours. CBG: Recent Labs  Lab 11/07/22 0741  GLUCAP 115*    Iron Studies:  No results for input(s): "IRON", "TIBC", "TRANSFERRIN", "FERRITIN" in the last 72 hours.  Studies/Results: No results found. Medications: Infusions:  ferric gluconate (FERRLECIT) IVPB 250 mg (11/07/22 0947)    Scheduled Medications:  amLODipine  10 mg Oral Daily   carvedilol  6.25 mg Oral BID WC   heparin  5,000 Units Subcutaneous Q8H   hydrALAZINE  25 mg Oral TID   isosorbide mononitrate  15 mg Oral Daily   melatonin  3 mg Oral QHS   nicotine  21 mg Transdermal Daily    have reviewed scheduled and prn  medications.  Physical Exam: General:  NAD-  looks good Heart: RRR Lungs:mostly clear Abdomen: soft, non tender Extremities: no edema     11/08/2022,8:56 AM  LOS: 4 days

## 2022-11-08 NOTE — Progress Notes (Signed)
Rounding Note    Patient Name: Damon Williams Date of Encounter: 11/08/2022  [facility] Cardiologist: Howell Rucks  Subjective   No complaints  Inpatient Medications    Scheduled Meds:  amLODipine  10 mg Oral Daily   carvedilol  6.25 mg Oral BID WC   heparin  5,000 Units Subcutaneous Q8H   hydrALAZINE  25 mg Oral TID   isosorbide mononitrate  15 mg Oral Daily   melatonin  3 mg Oral QHS   nicotine  21 mg Transdermal Daily   Continuous Infusions:  ferric gluconate (FERRLECIT) IVPB 250 mg (11/08/22 0935)   PRN Meds: acetaminophen **OR** acetaminophen, ALPRAZolam, bisacodyl, fentaNYL (SUBLIMAZE) injection, ondansetron **OR** ondansetron (ZOFRAN) IV, oxyCODONE, traZODone   Vital Signs    Vitals:   11/07/22 0549 11/07/22 1226 11/07/22 2121 11/08/22 0519  BP: (!) 162/95 123/73 131/84 (!) 136/98  Pulse: 73 71 81 82  Resp: 18 18 20 18   Temp: 97.9 F (36.6 C) 97.9 F (36.6 C) 98.8 F (37.1 C) 98.6 F (37 C)  TempSrc: Oral Oral    SpO2: 98% 100% 99% 100%  Weight: 70.2 kg   70.1 kg  Height:        Intake/Output Summary (Last 24 hours) at 11/08/2022 1043 Last data filed at 11/08/2022 0559 Gross per 24 hour  Intake 960 ml  Output 800 ml  Net 160 ml      11/08/2022    5:19 AM 11/07/2022    5:49 AM 11/06/2022    5:00 AM  Last 3 Weights  Weight (lbs) 154 lb 8.7 oz 154 lb 12.2 oz 160 lb 0.9 oz  Weight (kg) 70.1 kg 70.2 kg 72.6 kg      Telemetry    NSR - Personally Reviewed  ECG    N/a - Personally Reviewed  Physical Exam   GEN: No acute distress.   Neck: No JVD Cardiac: RRR, no murmurs, rubs, or gallops.  Respiratory: Clear to auscultation bilaterally. GI: Soft, nontender, non-distended  MS: No edema; No deformity. Neuro:  Nonfocal  Psych: Normal affect   Labs    High Sensitivity Troponin:   Recent Labs  Lab 11/07/22 0921  TROPONINIHS 41*     Chemistry Recent Labs  Lab 11/04/22 0841 11/04/22 0901 11/05/22 0356 11/06/22 0409  11/07/22 0432 11/08/22 0430  NA  --   --  137 138 138 138  K  --   --  3.5 3.7 3.9 4.5  CL  --   --  109 108 107 107  CO2  --   --  21* 22 22 22   GLUCOSE  --   --  90 94 96 87  BUN  --   --  65* 70* 80* 95*  CREATININE  --   --  5.75* 6.00* 6.39* 7.16*  CALCIUM  --   --  7.7* 7.8* 8.1* 8.1*  MG  --  2.1 2.0  --   --   --   ALBUMIN   < >  --  2.4* 2.4* 2.4* 2.4*  GFRNONAA  --   --  11* 10* 9* 8*  ANIONGAP  --   --  7 8 9 9    < > = values in this interval not displayed.    Lipids  Recent Labs  Lab 11/04/22 0902  CHOL 130  TRIG 144  HDL 31*  LDLCALC 70  CHOLHDL 4.2    Hematology Recent Labs  Lab 11/04/22 0901 11/04/22 1028 11/05/22 0356 11/08/22  0430  WBC 5.5  --  4.9 7.9  RBC 3.38* 3.66* 3.11* 3.06*  HGB 10.7*  --  9.7* 9.5*  HCT 31.1*  --  28.5* 28.3*  MCV 92.0  --  91.6 92.5  MCH 31.7  --  31.2 31.0  MCHC 34.4  --  34.0 33.6  RDW 14.2  --  13.9 14.3  PLT 198  --  178 179   Thyroid  Recent Labs  Lab 11/04/22 0901  TSH 2.352    BNP Recent Labs  Lab 11/04/22 1112 11/05/22 0356  BNP 677.0* 564.0*    DDimer No results for input(s): "DDIMER" in the last 168 hours.   Radiology    No results found.  Cardiac Studies     Patient Profile     Damon Williams is a 62 y.o. male with a hx of HTN (not on medications), CKD stage 3b by 2023 labs, cocaine abuse, tobacco abuse, prior back surgery who is being seen 11/07/2022 for the evaluation of new cardiomyopathy at the request of Dr. Laural Benes.   Assessment & Plan     1.Acute HFrEF - 10/2022 echo: LVE 30-35%, global hypokinesis, mild LVH, grade II dd, low normal RV fxn, mod LAE, aortic root 43 mm - severe HTN not on meds risk for HTN CM. Also cocaine + on admit could be playing a role. Cr would prohibit consideration for ishcemic testing at this time, no indication for urgent ischemic testing - I/Os incomplete, he is on IV lasix 40mg  daily. We will defer diuretic dosing to neprhology   - medical therapy with  coreg 6.25mg  bid, hydral 25mg  tid, imdur 15.  -No ACE/ARB/ARNI/MRA/SGLT2i given renal dysfunction.     2.Substance abuse - cocaine + on admission   3. AKI on CKD - Cr 1.96 8 months ago, admitted with Cr 5.8 - followed by neprhology    No additional cardiology recs at this time. We have deferred diuretic dosing to neprhology. Can titrate hydralazine as needed for blood pressure.    For questions or updates, please contact [facility] Please consult [email or web address] for contact info under        Signed, Dina Rich, MD  11/08/2022, 10:43 AM

## 2022-11-09 LAB — PTH, INTACT AND CALCIUM
Calcium, Total (PTH): 8.2 mg/dL — ABNORMAL LOW (ref 8.7–10.2)
PTH: 98 pg/mL — ABNORMAL HIGH (ref 15–65)

## 2022-11-14 DIAGNOSIS — N2581 Secondary hyperparathyroidism of renal origin: Secondary | ICD-10-CM | POA: Diagnosis not present

## 2022-11-14 DIAGNOSIS — D631 Anemia in chronic kidney disease: Secondary | ICD-10-CM | POA: Diagnosis not present

## 2022-11-14 DIAGNOSIS — I120 Hypertensive chronic kidney disease with stage 5 chronic kidney disease or end stage renal disease: Secondary | ICD-10-CM | POA: Diagnosis not present

## 2022-11-14 DIAGNOSIS — I429 Cardiomyopathy, unspecified: Secondary | ICD-10-CM | POA: Diagnosis not present

## 2022-11-14 DIAGNOSIS — N185 Chronic kidney disease, stage 5: Secondary | ICD-10-CM | POA: Diagnosis not present

## 2022-11-23 DIAGNOSIS — H5203 Hypermetropia, bilateral: Secondary | ICD-10-CM | POA: Diagnosis not present

## 2022-11-23 DIAGNOSIS — H524 Presbyopia: Secondary | ICD-10-CM | POA: Diagnosis not present

## 2022-12-02 NOTE — Progress Notes (Deleted)
Cardiology Office Note   Date:  12/02/2022   ID:  Damon Williams, DOB [birth year], MRN [id number]  PCP:  Patient, No Pcp Per  Cardiologist:  Dr. Wyline Mood     No chief complaint on file.     History of Present Illness: Damon Williams is a 62 y.o. male who presents for post hospital for acute CHF.     hx of HTN (not on medications), CKD stage 3b by 2023 labs, cocaine abuse, tobacco abuse, prior back surgery.  Presented with 2 nights of  orthopnea and DOE.  In ER + for progressive renal failure and CHF.  BP 171/101  Cr at 5.80 and increased to 6.39 --BNP 677, TSH WNL, CXR with cardiomegaly with mild interstitial pulmonary edema and trace right pleural effusion. Renal US showed simple bilateral renal cysts, echogenic kidneys possibly related to medical renal disease   he was diuresed with IV lasix.  Given IV iron.   Echo with EF 30-35%, global HK, mild to mod LV dilation G2DD, low normal RVSF, moderate LAE, mildly dilated LA, small pericardial effusion, mild MR, moderate dilation of aortic root.    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  Unable to eval for ischemia with renal failure may need in future, he did have some chest pain prior to admit.  Hs troponin 41, compatible with demand ischemia  Difficult to begin GDMT due to renal failure medical therapy with coreg 6.25mg  bid, hydral 25mg  tid, imdur 15.  -No ACE/ARB/ARNI/MRA/SGLT2i given renal dysfunction.   Past Medical History:  Diagnosis Date   CKD stage 3b, GFR 30-44 ml/min (HCC)    Cocaine use    Hypertension    Tobacco abuse     Past Surgical History:  Procedure Laterality Date   BACK SURGERY       Current Outpatient Medications  Medication Sig Dispense Refill   amLODipine (NORVASC) 10 MG tablet Take 1 tablet (10 mg total) by mouth daily. 30 tablet 2   carvedilol (COREG) 6.25 MG tablet Take 1 tablet (6.25 mg total) by mouth 2 (two) times daily with a meal. 60  tablet 2   hydrALAZINE (APRESOLINE) 25 MG tablet Take 1 tablet (25 mg total) by mouth 3 (three) times daily. 90 tablet 2   isosorbide mononitrate (IMDUR) 30 MG 24 hr tablet Take 0.5 tablets (15 mg total) by mouth daily. 15 tablet 2   No current facility-administered medications for this visit.    Allergies:   Patient has no known allergies.    Social History:  The patient  reports that he has been smoking cigarettes. He has a 30.00 pack-year smoking history. He has never used smokeless tobacco. He reports that he does not currently use alcohol. He reports current drug use. Drug: Codeine.   Family History:  The patient's ***family history is not on file.    ROS:  General:no colds or fevers, no weight changes Skin:no rashes or ulcers HEENT:no blurred vision, no congestion CV:see HPI PUL:see HPI GI:no diarrhea constipation or melena, no indigestion GU:no hematuria, no dysuria MS:no joint pain, no claudication Neuro:no syncope, no lightheadedness Endo:no diabetes, no thyroid disease Wt Readings from Last 3 Encounters:  11/08/22 154 lb 8.7 oz (70.1 kg)  10/31/22 160 lb (72.6 kg)  02/14/22 168 lb (76.2 kg)     PHYSICAL EXAM: VS:  There were no vitals taken for this visit. , BMI There is no height or weight on file  to calculate BMI. General:Pleasant affect, NAD Skin:Warm and dry, brisk capillary refill HEENT:normocephalic, sclera clear, mucus membranes moist Neck:supple, no JVD, no bruits  Heart:S1S2 RRR without murmur, gallup, rub or click Lungs:clear without rales, rhonchi, or wheezes ZOX:WRUE, non tender, + BS, do not palpate liver spleen or masses Ext:no lower ext edema, 2+ pedal pulses, 2+ radial pulses Neuro:alert and oriented, MAE, follows commands, + facial symmetry    EKG:  EKG is ordered today. The ekg ordered today demonstrates ***   Recent Labs: 11/04/2022: TSH 2.352 11/05/2022: B Natriuretic Peptide 564.0; Magnesium 2.0 11/08/2022: BUN 95; Creatinine, Ser 7.16;  Hemoglobin 9.5; Platelets 179; Potassium 4.5; Sodium 138    Lipid Panel    Component Value Date/Time   CHOL 130 11/04/2022 0902   TRIG 144 11/04/2022 0902   HDL 31 (L) 11/04/2022 0902   CHOLHDL 4.2 11/04/2022 0902   VLDL 29 11/04/2022 0902   LDLCALC 70 11/04/2022 0902       Other studies Reviewed: Additional studies/ records that were reviewed today include: ***. Echo 11/04/22 IMPRESSIONS     1. Left ventricular ejection fraction, by estimation, is 30 to 35%. The  left ventricle has moderately decreased function. The left ventricle  demonstrates global hypokinesis. The left ventricular internal cavity size  was mildly to moderately dilated.  There is mild concentric left ventricular hypertrophy. Left ventricular  diastolic parameters are consistent with Grade II diastolic dysfunction  (pseudonormalization).   2. Right ventricular systolic function is low normal. The right  ventricular size is normal. Tricuspid regurgitation signal is inadequate  for assessing PA pressure.   3. Left atrial size was moderately dilated.   4. Right atrial size was mildly dilated.   5. A small pericardial effusion is present. The pericardial effusion is  posterior to the left ventricle.   6. The mitral valve is grossly normal, mildly thickened. Mild mitral  valve regurgitation.   7. The aortic valve is tricuspid. Aortic valve regurgitation is not  visualized.   8. Aortic dilatation noted. There is moderate dilatation of the aortic  root, measuring 43 mm.   9. The inferior vena cava is dilated in size with >50% respiratory  variability, suggesting right atrial pressure of 8 mmHg.   Comparison(s): No prior Echocardiogram. Consider hypertensive versus  infiltrative cardiomyopathy.   FINDINGS   Left Ventricle: Left ventricular ejection fraction, by estimation, is 30  to 35%. The left ventricle has moderately decreased function. The left  ventricle demonstrates global hypokinesis. Definity  contrast agent was  given IV to delineate the left  ventricular endocardial borders. The left ventricular internal cavity size  was mildly to moderately dilated. There is mild concentric left  ventricular hypertrophy. Abnormal (paradoxical) septal motion, consistent  with left bundle branch block. Left  ventricular diastolic parameters are consistent with Grade II diastolic  dysfunction (pseudonormalization).   Right Ventricle: The right ventricular size is normal. No increase in  right ventricular wall thickness. Right ventricular systolic function is  low normal. Tricuspid regurgitation signal is inadequate for assessing PA  pressure.   Left Atrium: Left atrial size was moderately dilated.   Right Atrium: Right atrial size was mildly dilated.   Pericardium: A small pericardial effusion is present. The pericardial  effusion is posterior to the left ventricle.   Mitral Valve: The mitral valve is grossly normal. There is mild thickening  of the mitral valve leaflet(s). Mild mitral valve regurgitation.   Tricuspid Valve: The tricuspid valve is grossly normal. Tricuspid valve  regurgitation  is trivial.   Aortic Valve: The aortic valve is tricuspid. Aortic valve regurgitation is  not visualized.   Pulmonic Valve: The pulmonic valve was grossly normal. Pulmonic valve  regurgitation is trivial.   Aorta: Aortic dilatation noted. There is moderate dilatation of the aortic  root, measuring 43 mm.   Venous: The inferior vena cava is dilated in size with greater than 50%  respiratory variability, suggesting right atrial pressure of 8 mmHg.   IAS/Shunts: No atrial level shunt detected by color flow Doppler.    ASSESSMENT AND PLAN:  1.  ***   Current medicines are reviewed with the patient today.  The patient Has no concerns regarding medicines.  The following changes have been made:  See above Labs/ tests ordered today include:see above  Disposition:   FU:  see  above  Signed, Nada Boozer, NP  12/02/2022 3:21 PM    [facility] [address] [place], [place], [place]  [address]/ [address] [place], [place] Phone: [phone]; Fax: [phone]  [phone]

## 2022-12-04 ENCOUNTER — Emergency Department (HOSPITAL_COMMUNITY): Payer: 59

## 2022-12-04 ENCOUNTER — Other Ambulatory Visit: Payer: Self-pay

## 2022-12-04 ENCOUNTER — Inpatient Hospital Stay (HOSPITAL_COMMUNITY)
Admission: EM | Admit: 2022-12-04 | Discharge: 2022-12-07 | DRG: 673 | Disposition: A | Discharge status: Home / Self Care | Payer: 59 | Attending: Internal Medicine | Admitting: Internal Medicine

## 2022-12-04 ENCOUNTER — Encounter (HOSPITAL_COMMUNITY): Payer: Self-pay | Admitting: Emergency Medicine

## 2022-12-04 DIAGNOSIS — N2581 Secondary hyperparathyroidism of renal origin: Secondary | ICD-10-CM | POA: Diagnosis present

## 2022-12-04 DIAGNOSIS — K5900 Constipation, unspecified: Secondary | ICD-10-CM | POA: Diagnosis present

## 2022-12-04 DIAGNOSIS — I10 Essential (primary) hypertension: Secondary | ICD-10-CM

## 2022-12-04 DIAGNOSIS — I429 Cardiomyopathy, unspecified: Secondary | ICD-10-CM | POA: Diagnosis not present

## 2022-12-04 DIAGNOSIS — N185 Chronic kidney disease, stage 5: Secondary | ICD-10-CM | POA: Diagnosis not present

## 2022-12-04 DIAGNOSIS — N19 Unspecified kidney failure: Principal | ICD-10-CM

## 2022-12-04 DIAGNOSIS — I132 Hypertensive heart and chronic kidney disease with heart failure and with stage 5 chronic kidney disease, or end stage renal disease: Secondary | ICD-10-CM | POA: Diagnosis present

## 2022-12-04 DIAGNOSIS — Z992 Dependence on renal dialysis: Secondary | ICD-10-CM

## 2022-12-04 DIAGNOSIS — R0602 Shortness of breath: Secondary | ICD-10-CM | POA: Diagnosis not present

## 2022-12-04 DIAGNOSIS — N186 End stage renal disease: Secondary | ICD-10-CM | POA: Diagnosis not present

## 2022-12-04 DIAGNOSIS — D631 Anemia in chronic kidney disease: Secondary | ICD-10-CM | POA: Diagnosis present

## 2022-12-04 DIAGNOSIS — F17210 Nicotine dependence, cigarettes, uncomplicated: Secondary | ICD-10-CM | POA: Diagnosis not present

## 2022-12-04 DIAGNOSIS — E875 Hyperkalemia: Secondary | ICD-10-CM

## 2022-12-04 DIAGNOSIS — Z4901 Encounter for fitting and adjustment of extracorporeal dialysis catheter: Secondary | ICD-10-CM | POA: Diagnosis not present

## 2022-12-04 DIAGNOSIS — K5730 Diverticulosis of large intestine without perforation or abscess without bleeding: Secondary | ICD-10-CM | POA: Diagnosis not present

## 2022-12-04 DIAGNOSIS — N179 Acute kidney failure, unspecified: Secondary | ICD-10-CM | POA: Diagnosis not present

## 2022-12-04 DIAGNOSIS — I5023 Acute on chronic systolic (congestive) heart failure: Secondary | ICD-10-CM | POA: Diagnosis not present

## 2022-12-04 DIAGNOSIS — E872 Acidosis, unspecified: Secondary | ICD-10-CM | POA: Diagnosis not present

## 2022-12-04 DIAGNOSIS — Z79899 Other long term (current) drug therapy: Secondary | ICD-10-CM | POA: Diagnosis not present

## 2022-12-04 DIAGNOSIS — I120 Hypertensive chronic kidney disease with stage 5 chronic kidney disease or end stage renal disease: Secondary | ICD-10-CM | POA: Diagnosis not present

## 2022-12-04 HISTORY — DX: Cardiomyopathy, unspecified: I42.9

## 2022-12-04 LAB — BLOOD GAS, VENOUS
Acid-base deficit: 12.8 mmol/L — ABNORMAL HIGH (ref 0.0–2.0)
Bicarbonate: 12 mmol/L — ABNORMAL LOW (ref 20.0–28.0)
Drawn by: 7049
O2 Saturation: 85.1 %
Patient temperature: 36.7
pCO2, Ven: 25 mmHg — ABNORMAL LOW (ref 44–60)
pH, Ven: 7.29 (ref 7.25–7.43)
pO2, Ven: 50 mmHg — ABNORMAL HIGH (ref 32–45)

## 2022-12-04 LAB — IRON AND TIBC
Iron: 91 ug/dL (ref 45–182)
Saturation Ratios: 37 % (ref 17.9–39.5)
TIBC: 249 ug/dL — ABNORMAL LOW (ref 250–450)
UIBC: 158 ug/dL

## 2022-12-04 LAB — BASIC METABOLIC PANEL
Anion gap: 19 — ABNORMAL HIGH (ref 5–15)
BUN: 166 mg/dL — ABNORMAL HIGH (ref 6–20)
CO2: 10 mmol/L — ABNORMAL LOW (ref 22–32)
Calcium: 7.1 mg/dL — ABNORMAL LOW (ref 8.9–10.3)
Chloride: 107 mmol/L (ref 98–111)
Creatinine, Ser: 19.37 mg/dL — ABNORMAL HIGH (ref 0.61–1.24)
GFR, Estimated: 2 mL/min — ABNORMAL LOW (ref >60)
Glucose, Bld: 95 mg/dL (ref 70–99)
Potassium: 6.2 mmol/L — ABNORMAL HIGH (ref 3.5–5.1)
Sodium: 136 mmol/L (ref 135–145)

## 2022-12-04 LAB — RETICULOCYTES
Immature Retic Fract: 11.9 % (ref 2.3–15.9)
RBC.: 2.55 MIL/uL — ABNORMAL LOW (ref 4.22–5.81)
Retic Count, Absolute: 31.1 10*3/uL (ref 19.0–186.0)
Retic Ct Pct: 1.2 % (ref 0.4–3.1)

## 2022-12-04 LAB — CBC
HCT: 24.1 % — ABNORMAL LOW (ref 39.0–52.0)
Hemoglobin: 8 g/dL — ABNORMAL LOW (ref 13.0–17.0)
MCH: 30.8 pg (ref 26.0–34.0)
MCHC: 33.2 g/dL (ref 30.0–36.0)
MCV: 92.7 fL (ref 80.0–100.0)
Platelets: 123 10*3/uL — ABNORMAL LOW (ref 150–400)
RBC: 2.6 MIL/uL — ABNORMAL LOW (ref 4.22–5.81)
RDW: 14.4 % (ref 11.5–15.5)
WBC: 4.1 10*3/uL (ref 4.0–10.5)
nRBC: 0 % (ref 0.0–0.2)

## 2022-12-04 LAB — FOLATE: Folate: 9.6 ng/mL (ref >5.9)

## 2022-12-04 LAB — BRAIN NATRIURETIC PEPTIDE: B Natriuretic Peptide: 1433 pg/mL — ABNORMAL HIGH (ref 0.0–100.0)

## 2022-12-04 LAB — VITAMIN B12: Vitamin B-12: 354 pg/mL (ref 180–914)

## 2022-12-04 LAB — FERRITIN: Ferritin: 816 ng/mL — ABNORMAL HIGH (ref 24–336)

## 2022-12-04 MED ORDER — ONDANSETRON HCL 4 MG/2ML IJ SOLN
4.0000 mg | Freq: Four times a day (QID) | INTRAMUSCULAR | Status: DC | PRN
  Administered 2022-12-05: 4 mg via INTRAVENOUS
  Filled 2022-12-04: qty 2

## 2022-12-04 MED ORDER — INSULIN ASPART 100 UNIT/ML IV SOLN
5.0000 [IU] | Freq: Once | INTRAVENOUS | Status: AC
  Administered 2022-12-04: 5 [IU] via INTRAVENOUS

## 2022-12-04 MED ORDER — FUROSEMIDE 10 MG/ML IJ SOLN
80.0000 mg | Freq: Once | INTRAMUSCULAR | Status: AC
  Administered 2022-12-04: 80 mg via INTRAVENOUS
  Filled 2022-12-04: qty 8

## 2022-12-04 MED ORDER — HEPARIN SODIUM (PORCINE) 5000 UNIT/ML IJ SOLN
5000.0000 [IU] | Freq: Three times a day (TID) | INTRAMUSCULAR | Status: DC
  Administered 2022-12-05 (×2): 5000 [IU] via SUBCUTANEOUS
  Filled 2022-12-04 (×3): qty 1

## 2022-12-04 MED ORDER — SODIUM BICARBONATE 8.4 % IV SOLN
50.0000 meq | Freq: Once | INTRAVENOUS | Status: AC
  Administered 2022-12-04: 50 meq via INTRAVENOUS
  Filled 2022-12-04: qty 50

## 2022-12-04 MED ORDER — ACETAMINOPHEN 325 MG PO TABS: 650.0000 mg | ORAL_TABLET | Freq: Four times a day (QID) | ORAL | Status: DC | PRN

## 2022-12-04 MED ORDER — CALCIUM GLUCONATE-NACL 1-0.675 GM/50ML-% IV SOLN
1.0000 g | Freq: Once | INTRAVENOUS | Status: AC
  Administered 2022-12-04: 1000 mg via INTRAVENOUS
  Filled 2022-12-04: qty 50

## 2022-12-04 MED ORDER — CARVEDILOL 3.125 MG PO TABS
6.2500 mg | ORAL_TABLET | Freq: Two times a day (BID) | ORAL | Status: DC
  Administered 2022-12-05 – 2022-12-07 (×4): 6.25 mg via ORAL
  Filled 2022-12-04 (×5): qty 2

## 2022-12-04 MED ORDER — SODIUM ZIRCONIUM CYCLOSILICATE 5 G PO PACK: 10.0000 g | PACK | Freq: Every day | ORAL | Status: DC

## 2022-12-04 MED ORDER — HYDRALAZINE HCL 25 MG PO TABS
25.0000 mg | ORAL_TABLET | Freq: Three times a day (TID) | ORAL | Status: DC
  Administered 2022-12-04 – 2022-12-07 (×7): 25 mg via ORAL
  Filled 2022-12-04 (×8): qty 1

## 2022-12-04 MED ORDER — ISOSORBIDE MONONITRATE ER 30 MG PO TB24
15.0000 mg | ORAL_TABLET | Freq: Every day | ORAL | Status: DC
  Administered 2022-12-05 – 2022-12-07 (×3): 15 mg via ORAL
  Filled 2022-12-04 (×3): qty 1

## 2022-12-04 MED ORDER — OXYCODONE HCL 5 MG PO TABS: 5.0000 mg | ORAL_TABLET | ORAL | Status: DC | PRN

## 2022-12-04 MED ORDER — SODIUM ZIRCONIUM CYCLOSILICATE 10 G PO PACK
10.0000 g | PACK | Freq: Two times a day (BID) | ORAL | Status: DC
  Administered 2022-12-04: 10 g via ORAL
  Filled 2022-12-04: qty 2

## 2022-12-04 MED ORDER — ONDANSETRON HCL 4 MG PO TABS: 4.0000 mg | ORAL_TABLET | Freq: Four times a day (QID) | ORAL | Status: DC | PRN

## 2022-12-04 MED ORDER — MELATONIN 3 MG PO TABS
6.0000 mg | ORAL_TABLET | Freq: Every day | ORAL | Status: DC
  Administered 2022-12-04 – 2022-12-06 (×3): 6 mg via ORAL
  Filled 2022-12-04 (×3): qty 2

## 2022-12-04 MED ORDER — ACETAMINOPHEN 650 MG RE SUPP: 650.0000 mg | Freq: Four times a day (QID) | RECTAL | Status: DC | PRN

## 2022-12-04 MED ORDER — DEXTROSE 50 % IV SOLN
1.0000 | Freq: Once | INTRAVENOUS | Status: AC
  Administered 2022-12-04: 50 mL via INTRAVENOUS
  Filled 2022-12-04: qty 50

## 2022-12-04 MED ORDER — AMLODIPINE BESYLATE 5 MG PO TABS
10.0000 mg | ORAL_TABLET | Freq: Every day | ORAL | Status: DC
  Administered 2022-12-05 – 2022-12-07 (×3): 10 mg via ORAL
  Filled 2022-12-04 (×4): qty 2

## 2022-12-04 NOTE — ED Provider Notes (Signed)
Damon Williams EMERGENCY DEPARTMENT AT [facility] Provider Note   CSN: [id number] Arrival date & time: 12/04/22  1736     History  Chief Complaint  Patient presents with   Shortness of Breath    Damon Williams is a 62 y.o. male.  HPI    62 year old male comes in with chief complaint of abdominal pain, constipation, shortness of breath.  Patient has history of hypertension, CHF, chronic kidney disease.  He was recently admitted to the hospital where renal failure was discovered along with heart failure.  Patient states that he has been taking his medications as prescribed.  However, over the last few days he has noted increased swelling in his legs, loss of appetite, nausea and also some bad taste.  He denies any confusion and his energy level is mostly at baseline.  With worsening swelling in the legs, he decided to come to the ER.  Review of system also positive for shortness of breath, mostly with exertion.  Home Medications Prior to Admission medications   Medication Sig Start Date End Date Taking? Authorizing Provider  amLODipine (NORVASC) 10 MG tablet Take 1 tablet (10 mg total) by mouth daily. 11/09/22   Johnson, Clanford L, MD  carvedilol (COREG) 6.25 MG tablet Take 1 tablet (6.25 mg total) by mouth 2 (two) times daily with a meal. 11/08/22   Johnson, Clanford L, MD  hydrALAZINE (APRESOLINE) 25 MG tablet Take 1 tablet (25 mg total) by mouth 3 (three) times daily. 11/08/22   Johnson, Clanford L, MD  isosorbide mononitrate (IMDUR) 30 MG 24 hr tablet Take 0.5 tablets (15 mg total) by mouth daily. 11/09/22   Cleora Fleet, MD      Allergies    Patient has no known allergies.    Review of Systems   Review of Systems  All other systems reviewed and are negative.   Physical Exam Updated Vital Signs BP (!) 150/90   Pulse 84   Temp 98 F (36.7 C) (Oral)   Resp 16   Ht 5\' 7"  (1.702 m)   Wt 69.9 kg   SpO2 97%   BMI 24.12 kg/m  Physical Exam Vitals and  nursing note reviewed.  Constitutional:      Appearance: He is well-developed.  HENT:     Head: Atraumatic.  Cardiovascular:     Rate and Rhythm: Normal rate.  Pulmonary:     Effort: Pulmonary effort is normal.     Breath sounds: Examination of the right-lower field reveals rales. Examination of the left-lower field reveals rales. Rales present.  Musculoskeletal:     Cervical back: Neck supple.     Right lower leg: Edema present.     Left lower leg: Edema present.  Skin:    General: Skin is warm.  Neurological:     Mental Status: He is alert and oriented to person, place, and time.     ED Results / Procedures / Treatments   Labs (all labs ordered are listed, but only abnormal results are displayed) Labs Reviewed  BASIC METABOLIC PANEL - Abnormal; Notable for the following components:      Result Value   Potassium 6.2 (*)    CO2 10 (*)    BUN 166 (*)    Creatinine, Ser 19.37 (*)    Calcium 7.1 (*)    GFR, Estimated 2 (*)    Anion gap 19 (*)    All other components within normal limits  CBC - Abnormal; Notable for the  following components:   RBC 2.60 (*)    Hemoglobin 8.0 (*)    HCT 24.1 (*)    Platelets 123 (*)    All other components within normal limits  BRAIN NATRIURETIC PEPTIDE - Abnormal; Notable for the following components:   B Natriuretic Peptide 1,433.0 (*)    All other components within normal limits  BLOOD GAS, VENOUS - Abnormal; Notable for the following components:   pCO2, Ven 25 (*)    pO2, Ven 50 (*)    Bicarbonate 12.0 (*)    Acid-base deficit 12.8 (*)    All other components within normal limits  RETICULOCYTES - Abnormal; Notable for the following components:   RBC. 2.55 (*)    All other components within normal limits  VITAMIN B12  FOLATE  IRON AND TIBC  FERRITIN  URINALYSIS, ROUTINE W REFLEX MICROSCOPIC    EKG EKG Interpretation  Date/Time:  Sunday Dec 04 2022 17:59:49 EDT Ventricular Rate:  89 PR Interval:  174 QRS Duration: 83 QT  Interval:  379 QTC Calculation: 462 R Axis:   53 Text Interpretation: Sinus rhythm Left ventricular hypertrophy Probable anterior infarct, old Nonspecific T abnormalities, lateral leads No acute changes No significant change since last tracing Confirmed by Derwood Kaplan ([id number]) on 12/04/2022 6:51:47 PM  Radiology CT Renal Stone Study  Result Date: 12/04/2022 CLINICAL DATA:  New onset renal failure. EXAM: CT ABDOMEN AND PELVIS WITHOUT CONTRAST TECHNIQUE: Multidetector CT imaging of the abdomen and pelvis was performed following the standard protocol without IV contrast. RADIATION DOSE REDUCTION: This exam was performed according to the departmental dose-optimization program which includes automated exposure control, adjustment of the mA and/or kV according to patient size and/or use of iterative reconstruction technique. COMPARISON:  None Available. FINDINGS: Lower chest: Enlarged heart. Hepatobiliary: No focal liver abnormality is seen. No gallstones, gallbladder wall thickening, or biliary dilatation. Pancreas: Unremarkable. No pancreatic ductal dilatation or surrounding inflammatory changes. Spleen: Normal in size without focal abnormality. Adrenals/Urinary Tract: Adrenal glands are unremarkable. Kidneys are normal, without renal calculi, focal lesion, or hydronephrosis. Bladder is unremarkable. Stomach/Bowel: Stomach is within normal limits. Appendix appears normal. No evidence of bowel wall thickening, distention, or inflammatory changes. Diffuse colonic diverticulosis without evidence of diverticulitis. Vascular/Lymphatic: No significant vascular findings are present. No enlarged abdominal or pelvic lymph nodes. Reproductive: Prostate is unremarkable. Other: Diffuse mesenteric stranding, particularly prominent in the upper abdomen. Associated retroperitoneal fat stranding. Musculoskeletal: No acute or significant osseous findings. IMPRESSION: 1. Diffuse mesenteric stranding, particularly prominent in  the upper abdomen. Associated retroperitoneal fat stranding. Findings are nonspecific, but may be seen in the setting of mesenteric panniculitis or third spacing. 2. Enlarged heart. 3. Left colonic diverticulosis. Electronically Signed   By: Ted Mcalpine M.D.   On: 12/04/2022 19:32   DG Chest 2 View  Result Date: 12/04/2022 CLINICAL DATA:  Shortness of breath and mid abdominal pain. EXAM: CHEST - 2 VIEW COMPARISON:  October 31, 2022 FINDINGS: The cardiac silhouette is mildly enlarged and unchanged in size. Mild, diffusely increased interstitial lung markings are noted. This is decreased in severity when compared to the prior study. There is no evidence of focal consolidation, pleural effusion or pneumothorax. The visualized skeletal structures are unremarkable. IMPRESSION: Stable cardiomegaly with additional findings that may represent mild interstitial edema. Electronically Signed   By: Aram Candela M.D.   On: 12/04/2022 18:39    Procedures .Critical Care  Performed by: Derwood Kaplan, MD Authorized by: Derwood Kaplan, MD   Critical care provider statement:  Critical care time (minutes):  36   Critical care was necessary to treat or prevent imminent or life-threatening deterioration of the following conditions:  Renal failure and metabolic crisis   Critical care was time spent personally by me on the following activities:  Development of treatment plan with patient or surrogate, discussions with consultants, evaluation of patient's response to treatment, examination of patient, ordering and review of laboratory studies, ordering and review of radiographic studies, ordering and performing treatments and interventions, pulse oximetry, re-evaluation of patient's condition and review of old charts     Medications Ordered in ED Medications  calcium gluconate 1 g/ 50 mL sodium chloride IVPB (has no administration in time range)  sodium zirconium cyclosilicate (LOKELMA) packet 10 g (10 g  Oral Given 12/04/22 1925)  furosemide (LASIX) injection 80 mg (80 mg Intravenous Given 12/04/22 1926)  insulin aspart (novoLOG) injection 5 Units (5 Units Intravenous Given 12/04/22 1926)    And  dextrose 50 % solution 50 mL (50 mLs Intravenous Given 12/04/22 1926)  sodium bicarbonate injection 50 mEq (50 mEq Intravenous Given 12/04/22 1927)    ED Course/ Medical Decision Making/ A&P                             Medical Decision Making Amount and/or Complexity of Data Reviewed Labs: ordered. Radiology: ordered.  Risk OTC drugs. Prescription drug management. Decision regarding hospitalization.  62 year old male with history of hypertension, CHF, chronic kidney disease comes in with chief complaint of constipation, loss of appetite, increased swelling in his legs and shortness of breath.  I reviewed patient's recent admission to the hospital and went over his echocardiogram which revealed EF of 35%.  I reviewed patient's medication, it appears that he is not on Lasix.  On exam, patient is volume overloaded.  Differential diagnosis for him includes worsening renal failure, severe electrolyte derangement, uremia, hyperkalemia, acidosis, worsening CHF, worsening anemia, pleural effusion, pulmonary edema.  Basic labs ordered and patient found to have profound uremia with BUN over 150.  Patient also has hyperkalemia with potassium of 6.2.  His creatinine has almost gone up to 20.  Patient's hemoglobin has also come down, but that is likely because of his renal failure.  Patient denies any bloody stools.  I consulted nephrology and spoke with Dr. Thedore Mins.  Patient has oliguric renal failure, with volume overload and uremia, hyperkalemia.  Dr. Thedore Mins recommends getting CT renal stone and giving patient hyperkalemia medications including IV Lasix of 80 mg.  They will see the patient first thing tomorrow morning and reassess if patient needs to be transferred to [facility].  This recommendation has been  discussed with the patient.  CT renal stone ordered, independently interpreted.  There is no evidence of hydronephrosis.  Final Clinical Impression(s) / ED Diagnoses Final diagnoses:  Uremia  Acute renal failure superimposed on stage 5 chronic kidney disease, not on chronic dialysis, unspecified acute renal failure type (HCC)  Acute hyperkalemia    Rx / DC Orders ED Discharge Orders     None         Derwood Kaplan, MD 12/04/22 1941

## 2022-12-04 NOTE — ED Triage Notes (Addendum)
Pt c/o SOB and mid abdominal pain with constipation and decreased appetite since this morning. Pt denies CP, no n/v. Recently stayed in hospital for BP concerns. Pt has 1+ pitting edema to bilateral feet and ankles; hx HTN reported.

## 2022-12-04 NOTE — Assessment & Plan Note (Signed)
-  2/2 acute renal failure -Gap 19 -Bicarb 10 -NaCO3- amp in ED -Defer to nephro

## 2022-12-04 NOTE — H&P (Signed)
History and Physical    Patient: Damon Williams ZOX:[id number] DOB: [birth year] DOA: 12/04/2022 DOS: the patient was seen and examined on 12/04/2022 PCP: Patient, No Pcp Per  Patient coming from: Home  Chief Complaint:  Chief Complaint  Patient presents with   Shortness of Breath   HPI: MEER SCHUERGER is a 62 y.o. male with medical history significant of uncontrolled high blood pressure, history of substance use, but no longer per his report, and more presents to the ED with a chief complaint of shortness of breath.  Patient reports he has not had any urine output in about 2 days.  He has not had any bowel movements either.  He has passed very little flatulence per his report.  He had no appetite and his abdomen feels bloated.  He had very little p.o. intake.  He reports a funny taste in his mouth.  It is not metallic, but it tastes like nasty powdery taste per his report.  He did recently have all of his teeth removed on 10 May.  He reports he has been taking his antibiotic and ibuprofen.  His ibuprofen he is only taking once or twice per day because his pain has not been that bad.  He reports no abdominal pain just the bloated feeling.  He has had no palpitations, no chest pain.  Patient reports he does have shortness of breath.  It is worse with exertion.  He has had peripheral edema as well.  He denies any muscle cramping.  He supposed be following up with nephrology on Tuesday.  He also has an appointment cardiology.  Patient reports a history of uncontrolled hypertension, but to his knowledge he is not a diabetic.  He has no other complaints at this time.  Patient quit smoking during his last hospitalization.  He does not drink alcohol anymore.  He has not touched any drugs since his last hospitalization.  Patient is very upset to hear that his creatinine is higher even though he has not been using alcohol or drugs.  He is vaccinated for COVID.  Patient is full code. Review of Systems: As  mentioned in the history of present illness. All other systems reviewed and are negative. Past Medical History:  Diagnosis Date   CKD stage 3b, GFR 30-44 ml/min (HCC)    Cocaine use    Hypertension    Tobacco abuse    Past Surgical History:  Procedure Laterality Date   BACK SURGERY     Social History:  reports that he has been smoking cigarettes. He has a 30.00 pack-year smoking history. He has never used smokeless tobacco. He reports that he does not currently use alcohol. He reports current drug use. Drug: Codeine.  No Known Allergies  Family History  Problem Relation Age of Onset   Heart disease Neg Hx     Prior to Admission medications   Medication Sig Start Date End Date Taking? Authorizing Provider  amLODipine (NORVASC) 10 MG tablet Take 1 tablet (10 mg total) by mouth daily. 11/09/22  Yes Johnson, Clanford L, MD  carvedilol (COREG) 6.25 MG tablet Take 1 tablet (6.25 mg total) by mouth 2 (two) times daily with a meal. 11/08/22  Yes Johnson, Clanford L, MD  chlorhexidine (PERIDEX) 0.12 % solution Use as directed 15 mLs in the mouth or throat 2 (two) times daily.   Yes [provider]  diphenhydramine-acetaminophen (TYLENOL PM) 25-500 MG TABS tablet Take 1 tablet by mouth at bedtime as needed (sleep, pain).  Yes [provider]  hydrALAZINE (APRESOLINE) 25 MG tablet Take 1 tablet (25 mg total) by mouth 3 (three) times daily. 11/08/22  Yes Johnson, Clanford L, MD  ibuprofen (ADVIL) 800 MG tablet Take 800 mg by mouth every 8 (eight) hours as needed for headache or moderate pain.   Yes [provider]  isosorbide mononitrate (IMDUR) 30 MG 24 hr tablet Take 0.5 tablets (15 mg total) by mouth daily. 11/09/22  Yes Johnson, Clanford L, MD  penicillin v potassium (VEETID) 500 MG tablet Take 500 mg by mouth 4 (four) times daily. 14 day course.   Yes [provider]    Physical Exam: Vitals:   12/04/22 1900 12/04/22 1930 12/04/22 2000 12/04/22 2042  BP:  (!) 150/90 (!) 163/96 (!) 170/89 (!) 150/79  Pulse: 84 83 90 93  Resp: 16 (!) 24 20 20   Temp:   98 F (36.7 C) 97.9 F (36.6 C)  TempSrc:   Oral Oral  SpO2: 97% 95% 100% 97%  Weight:      Height:       1.  General: Patient lying supine in bed,  no acute distress   2. Psychiatric: Alert and oriented x 3, mood and behavior normal for situation, pleasant and cooperative with exam   3. Neurologic: Speech and language are normal, face is symmetric, moves all 4 extremities voluntarily, at baseline without acute deficits on limited exam   4. HEENMT:  Head is atraumatic, normocephalic, pupils reactive to light, neck is supple, trachea is midline, mucous membranes are moist   5. Respiratory : Lungs are clear to auscultation bilaterally without wheezing, rhonchi, rales, no cyanosis, no increase in work of breathing or accessory muscle use   6. Cardiovascular : Heart rate normal, rhythm is regular, no murmurs, rubs or gallops, significant peripheral edema peripheral pulses palpated   7. Gastrointestinal:  Abdomen is soft, distended with fluid, nontender to palpation bowel sounds active, no masses or organomegaly palpated   8. Skin:  Skin is warm, dry and intact without rashes, acute lesions, or ulcers on limited exam   9.Musculoskeletal:  No acute deformities or trauma, no asymmetry in tone, significant peripheral edema, peripheral pulses palpated, no tenderness to palpation in the extremities  Data Reviewed: In the ED Temp 98, heart rate 84-88, respiratory 16-18, blood pressure 147/89-150/90, satting 97-98% No leukocytosis with white blood cell count of 4.1, hemoglobin 8.0 Chemistry reveals a hyperkalemia at 6.2, decreased bicarb at 10, increased BUN 166, increased creatinine and 19.37, gap of 19, GFR 2 BNP 1433 Chest x-ray shows stable cardiomegaly with additional findings that may represent mild interstitial edema pH 7.29, pCO2 25 Patient was given Lokelma, Lasix, calcium  gluconate, sodium bicarb, glucose/insulin in the ED Nephrology was consulted and says to have patient admitted here to [facility] and they will see in the a.m.  Assessment and Plan: * AKI (acute kidney injury) (HCC) -Cr steadily trending upward since late April -Cr now 19.37 -Nephro consulted from ED and advises admission to [facility] for AM eval  Metabolic acidosis -2/2 acute renal failure -Gap 19 -Bicarb 10 -NaCO3- amp in ED -Defer to nephro   Hyperkalemia -K+ 6.2 -Hyperkalemia cocktail and lokelma given in the ED -Trend at mindnight   Uncontrolled hypertension -continue norvasc, coreg, hydralazine       Advance Care Planning:   Code Status: Full Code  Consults: Nephrology  Family Communication: No family at bedside  Severity of Illness: The appropriate patient status for this patient is INPATIENT.  Inpatient status is judged to be reasonable and necessary in order to provide the required intensity of service to ensure the patient's safety. The patient's presenting symptoms, physical exam findings, and initial radiographic and laboratory data in the context of their chronic comorbidities is felt to place them at high risk for further clinical deterioration. Furthermore, it is not anticipated that the patient will be medically stable for discharge from the hospital within 2 midnights of admission.   * I certify that at the point of admission it is my clinical judgment that the patient will require inpatient hospital care spanning beyond 2 midnights from the point of admission due to high intensity of service, high risk for further deterioration and high frequency of surveillance required.*  Author: Lilyan Gilford, DO 12/04/2022 8:43 PM  For on call review [email or web address].

## 2022-12-04 NOTE — Progress Notes (Addendum)
Informed of patient in ER. P/w SOB, abd pain/constipation, low appetite. Recent admit for malignant HTN. Likely CKD5, now followed at our [place] office. Cr 7.27, GFR 8 on 11/14/22, CKD5 now at this point. Cr 19.37, BUN 166, k 6.2, CO2 10 on presentation to ER. Recommend medical management for hyperK and diuresis given possible edema on CXR. Recommend ruling out obstructive etiology given significant rise in Cr. At this junction, seems that he is now ESRD but will watch trajectory for now. Will keep him NPO after midnight just in case in needs a HD cathter and needs to start HD. To be admitted. Full consult to follow in AM. Discussed with ER MD. Call with any questions/concerns in the interim.  Anthony Sar, MD [facility]

## 2022-12-04 NOTE — Assessment & Plan Note (Signed)
-  K+ 6.2 -Hyperkalemia cocktail and lokelma given in the ED -Trend at [facility]

## 2022-12-04 NOTE — Assessment & Plan Note (Signed)
-  continue norvasc, coreg, hydralazine

## 2022-12-04 NOTE — Assessment & Plan Note (Signed)
-  Cr steadily trending upward since late April -Cr now 19.37 -Nephro consulted from ED and advises admission to [facility] for AM eval

## 2022-12-05 ENCOUNTER — Encounter (HOSPITAL_COMMUNITY): Payer: Self-pay | Admitting: Family Medicine

## 2022-12-05 DIAGNOSIS — Z992 Dependence on renal dialysis: Secondary | ICD-10-CM

## 2022-12-05 DIAGNOSIS — N186 End stage renal disease: Secondary | ICD-10-CM

## 2022-12-05 DIAGNOSIS — E875 Hyperkalemia: Secondary | ICD-10-CM | POA: Diagnosis not present

## 2022-12-05 DIAGNOSIS — I10 Essential (primary) hypertension: Secondary | ICD-10-CM

## 2022-12-05 DIAGNOSIS — N179 Acute kidney failure, unspecified: Principal | ICD-10-CM

## 2022-12-05 DIAGNOSIS — E872 Acidosis, unspecified: Secondary | ICD-10-CM

## 2022-12-05 DIAGNOSIS — N19 Unspecified kidney failure: Principal | ICD-10-CM

## 2022-12-05 HISTORY — DX: End stage renal disease: N18.6

## 2022-12-05 LAB — CBC WITH DIFFERENTIAL/PLATELET
Abs Immature Granulocytes: 0.04 10*3/uL (ref 0.00–0.07)
Basophils Absolute: 0 10*3/uL (ref 0.0–0.1)
Basophils Relative: 1 %
Eosinophils Absolute: 0.2 10*3/uL (ref 0.0–0.5)
Eosinophils Relative: 4 %
HCT: 22.1 % — ABNORMAL LOW (ref 39.0–52.0)
Hemoglobin: 7.5 g/dL — ABNORMAL LOW (ref 13.0–17.0)
Immature Granulocytes: 1 %
Lymphocytes Relative: 28 %
Lymphs Abs: 1.5 10*3/uL (ref 0.7–4.0)
MCH: 31.8 pg (ref 26.0–34.0)
MCHC: 33.9 g/dL (ref 30.0–36.0)
MCV: 93.6 fL (ref 80.0–100.0)
Monocytes Absolute: 0.6 10*3/uL (ref 0.1–1.0)
Monocytes Relative: 12 %
Neutro Abs: 3 10*3/uL (ref 1.7–7.7)
Neutrophils Relative %: 54 %
Platelets: 115 10*3/uL — ABNORMAL LOW (ref 150–400)
RBC: 2.36 MIL/uL — ABNORMAL LOW (ref 4.22–5.81)
RDW: 14.3 % (ref 11.5–15.5)
WBC: 5.3 10*3/uL (ref 4.0–10.5)
nRBC: 0 % (ref 0.0–0.2)

## 2022-12-05 LAB — COMPREHENSIVE METABOLIC PANEL
ALT: 50 U/L — ABNORMAL HIGH (ref 0–44)
AST: 32 U/L (ref 15–41)
Albumin: 2.4 g/dL — ABNORMAL LOW (ref 3.5–5.0)
Alkaline Phosphatase: 48 U/L (ref 38–126)
Anion gap: 20 — ABNORMAL HIGH (ref 5–15)
BUN: 172 mg/dL — ABNORMAL HIGH (ref 6–20)
CO2: 10 mmol/L — ABNORMAL LOW (ref 22–32)
Calcium: 7.1 mg/dL — ABNORMAL LOW (ref 8.9–10.3)
Chloride: 107 mmol/L (ref 98–111)
Creatinine, Ser: 20.32 mg/dL — ABNORMAL HIGH (ref 0.61–1.24)
GFR, Estimated: 2 mL/min — ABNORMAL LOW (ref >60)
Glucose, Bld: 85 mg/dL (ref 70–99)
Potassium: 5.5 mmol/L — ABNORMAL HIGH (ref 3.5–5.1)
Sodium: 137 mmol/L (ref 135–145)
Total Bilirubin: 0.6 mg/dL (ref 0.3–1.2)
Total Protein: 5.1 g/dL — ABNORMAL LOW (ref 6.5–8.1)

## 2022-12-05 LAB — MAGNESIUM: Magnesium: 2.3 mg/dL (ref 1.7–2.4)

## 2022-12-05 LAB — PHOSPHORUS: Phosphorus: 10 mg/dL — ABNORMAL HIGH (ref 2.5–4.6)

## 2022-12-05 LAB — POTASSIUM: Potassium: 5.5 mmol/L — ABNORMAL HIGH (ref 3.5–5.1)

## 2022-12-05 MED ORDER — HEPARIN SODIUM (PORCINE) 1000 UNIT/ML IJ SOLN
INTRAMUSCULAR | Status: AC
  Filled 2022-12-05: qty 4

## 2022-12-05 MED ORDER — SODIUM ZIRCONIUM CYCLOSILICATE 10 G PO PACK
10.0000 g | PACK | Freq: Three times a day (TID) | ORAL | Status: DC
  Administered 2022-12-05 (×3): 10 g via ORAL
  Filled 2022-12-05 (×3): qty 1

## 2022-12-05 MED ORDER — ALTEPLASE 2 MG IJ SOLR: 2.0000 mg | Freq: Once | INTRAMUSCULAR | Status: DC | PRN

## 2022-12-05 MED ORDER — CHLORHEXIDINE GLUCONATE CLOTH 2 % EX PADS
6.0000 | MEDICATED_PAD | Freq: Every day | CUTANEOUS | Status: DC
  Administered 2022-12-05 – 2022-12-07 (×3): 6 via TOPICAL

## 2022-12-05 MED ORDER — CEFAZOLIN (ANCEF) 1 G IV SOLR: 1.0000 g | INTRAVENOUS | Status: DC

## 2022-12-05 MED ORDER — CEFAZOLIN SODIUM-DEXTROSE 2-4 GM/100ML-% IV SOLN: 2.0000 g | INTRAVENOUS | Status: DC

## 2022-12-05 MED ORDER — HEPARIN SODIUM (PORCINE) 1000 UNIT/ML DIALYSIS
1000.0000 [IU] | INTRAMUSCULAR | Status: DC | PRN
  Administered 2022-12-05: 3000 [IU]
  Administered 2022-12-06: 2800 [IU]

## 2022-12-05 MED ORDER — NICOTINE 14 MG/24HR TD PT24: 14.0000 mg | MEDICATED_PATCH | Freq: Every day | TRANSDERMAL | Status: DC | PRN

## 2022-12-05 MED ORDER — CALCIUM ACETATE (PHOS BINDER) 667 MG PO CAPS
667.0000 mg | ORAL_CAPSULE | Freq: Three times a day (TID) | ORAL | Status: DC
  Administered 2022-12-05 – 2022-12-07 (×6): 667 mg via ORAL
  Filled 2022-12-05 (×6): qty 1

## 2022-12-05 MED ORDER — DARBEPOETIN ALFA 60 MCG/0.3ML IJ SOSY
60.0000 ug | PREFILLED_SYRINGE | INTRAMUSCULAR | Status: DC
  Administered 2022-12-05: 60 ug via SUBCUTANEOUS
  Filled 2022-12-05: qty 0.3

## 2022-12-05 NOTE — Progress Notes (Signed)
Patient back to floor from dialysis treatment. Patient comfortable with no complaints. Dialysis dressing reinforced.

## 2022-12-05 NOTE — Consult Note (Signed)
Consulted by nephrology to place a temporary dialysis catheter.  The risks and benefits of the procedure including bleeding and infection were fully explained to the patient, who gave informed consent.  Ancef 1 g IV given.

## 2022-12-05 NOTE — Consult Note (Signed)
Nephrology Consult   Assessment/Recommendations:  AKI on CKD5 -likely progressive CKD at this point -> ESRD. Followed at our [place] office. Last labs from 5/6 revealed a Cr of 7.27/eGFR 8 -no obstruction on CT renal stone. UA pending. -given uremic symptoms would recommend starting HD. Does not have the renal reserve to begin with. No urine output since receiving lasix 80mg  IV x 1 dose. Discussed at length with patient and he is in full agreement with starting HD, explained to him the process -Discussed with IR today, given schedule would not feasible to do [facility] today therefore will plan from a temporary HD catheter with plans to convert to [facility] at some point this week. Discussed with Dr. Lovell Sheehan, appreciate assistance for temp line placement. Would also recommend [facility] consultation later this week for AVF/AVG placement prior to discharge -slow start protocol for HD. HD #1 5/27, #2 planned for 5/28 -CLIP for outpatient placement for HD-per SW/CM -Avoid nephrotoxic medications including NSAIDs and iodinated intravenous contrast exposure unless the latter is absolutely indicated.  Preferred narcotic agents for pain control are hydromorphone, fentanyl, and methadone. Morphine should not be used. Avoid Baclofen and avoid oral sodium phosphate and magnesium citrate based laxatives / bowel preps. Continue strict Input and Output monitoring. Will monitor the patient closely with you and intervene or adjust therapy as indicated by changes in clinical status/labs   Hyperkalemia -c/w lokelma 10g BID until on HD  Anion Gap Metabolic Acidosis -secondary to CKD5, starting HD as above  HTN -c/w home meds, will UF as tolerated  Acute on chronic HFrEF exacerbation -EF 30-35% on TTE 4/26 -will UF as tolerated with HD  Anemia due to chronic kidney disease -Transfuse for Hgb<7 g/dL -iron replete on panel 5/26 -start ESA aranesp qMonday 5/27  Secondary hyperaparathyroidism, CKD-MBD -starting  binders: calcium acetate 1 tab TIDAC -PTH 98 on 4/30. Will recheck  Recommendations conveyed to primary service.  Will continue to follow, call with any questions/concerns.  Anthony Sar [facility] 12/05/2022 7:58 AM   _____________________________________________________________________________________   History of Present Illness: Damon Williams is a/an 62 y.o. male with a past medical history of CKD 5, HFrEF, hypertension, neck smoker (recently quit ), history of cocaine abuse who presents to [facility] with shortness of breath.  Apparently has not had any urine output movements for about 2 days.  Has also been having dysgeusia.  He did have all of his teeth removed on May 10.  Has been taking ibuprofen intermittently and antibiotics (PCN).  Has been having increased swelling in his legs in the syndrome as well. Was recently admitted to the hospital from 4/26-4/30/24 for malignant hypertension.  Was not on any chronic medications nor did he have any regular medical follow-up until that time.  At the time of discharge, his creatinine was up to 7.16.  He did establish care at our [facility] office, last seen by Dr. Marisue Humble.  His latest labs from 5/6 revealed a creatinine of 7.27 with an EGFR of 8.  In the ER, he was found to have a BUN of 166, creatinine 19.37, potassium 6.2, CO2 10, hemoglobin 8.  He did receive medical management for his hyperkalemia, Lasix 80 mg IV x 1 dose.  K did come down to 5.5 around midnight.  CT renal stone done which did not reveal any obstruction however did reveal diffuse mesenteric stranding with associated RP fat stranding left colonic diverticulosis, enlarged heart.  Chest x-ray did reveal stable cardiomegaly with additional findings that may  represent mild interstitial edema. Did not make any urine since receiving Lasix. Patient seen and examined bedside. Bladder scan at the time of my arrival was only 91cc. He does report feeling slightly better in  regards to abdominal pain and SOB. He also endorses that he has felt fuzzy headed and that his sleep patterns have been abnormal (sleeps for only 2 hours then can. Has been abstaining from cocaine and smoking now. Denies any n/v, chest pain.   Medications:  Current Facility-Administered Medications  Medication Dose Route Frequency Provider Last Rate Last Admin   acetaminophen (TYLENOL) tablet 650 mg  650 mg Oral Q6H PRN Zierle-Ghosh, Asia B, DO       Or   acetaminophen (TYLENOL) suppository 650 mg  650 mg Rectal Q6H PRN Zierle-Ghosh, Asia B, DO       amLODipine (NORVASC) tablet 10 mg  10 mg Oral Daily Zierle-Ghosh, Asia B, DO       carvedilol (COREG) tablet 6.25 mg  6.25 mg Oral BID WC Zierle-Ghosh, Asia B, DO       heparin injection 5,000 Units  5,000 Units Subcutaneous Q8H Zierle-Ghosh, Asia B, DO   5,000 Units at 12/05/22 0542   hydrALAZINE (APRESOLINE) tablet 25 mg  25 mg Oral TID Zierle-Ghosh, Asia B, DO   25 mg at 12/04/22 2327   isosorbide mononitrate (IMDUR) 24 hr tablet 15 mg  15 mg Oral Daily Zierle-Ghosh, Asia B, DO       melatonin tablet 6 mg  6 mg Oral QHS Zierle-Ghosh, Asia B, DO   6 mg at 12/04/22 2327   ondansetron (ZOFRAN) tablet 4 mg  4 mg Oral Q6H PRN Zierle-Ghosh, Asia B, DO       Or   ondansetron (ZOFRAN) injection 4 mg  4 mg Intravenous Q6H PRN Zierle-Ghosh, Asia B, DO   4 mg at 12/05/22 0545   oxyCODONE (Oxy IR/ROXICODONE) immediate release tablet 5 mg  5 mg Oral Q4H PRN Zierle-Ghosh, Asia B, DO       sodium zirconium cyclosilicate (LOKELMA) packet 10 g  10 g Oral TID Laural Benes, Clanford L, MD         ALLERGIES Patient has no known allergies.  MEDICAL HISTORY Past Medical History:  Diagnosis Date   CKD stage 3b, GFR 30-44 ml/min (HCC)    Cocaine use    Hypertension    Tobacco abuse      SOCIAL HISTORY Social History   Socioeconomic History   Marital status: Married    Spouse name: Not on file   Number of children: Not on file   Years of education: Not on  file   Highest education level: Not on file  Occupational History   Not on file  Tobacco Use   Smoking status: Every Day    Packs/day: 1.00    Years: 30.00    Additional pack years: 0.00    Total pack years: 30.00    Types: Cigarettes   Smokeless tobacco: Never  Vaping Use   Vaping Use: Never used  Substance and Sexual Activity   Alcohol use: Not Currently    Comment: quit 1989   Drug use: Yes    Types: Codeine   Sexual activity: Yes    Birth control/protection: None  Other Topics Concern   Not on file  Social History Narrative   Not on file   Social Determinants of Health   Financial Resource Strain: Not on file  Food Insecurity: No Food Insecurity (12/04/2022)   Hunger Vital  Sign    Worried About Programme researcher, broadcasting/film/video in the Last Year: Never true    Ran Out of Food in the Last Year: Never true  Transportation Needs: No Transportation Needs (12/04/2022)   PRAPARE - Administrator, Civil Service (Medical): No    Lack of Transportation (Non-Medical): No  Physical Activity: Not on file  Stress: Not on file  Social Connections: Not on file  Intimate Partner Violence: Not At Risk (12/04/2022)   Humiliation, Afraid, Rape, and Kick questionnaire    Fear of Current or Ex-Partner: No    Emotionally Abused: No    Physically Abused: No    Sexually Abused: No     FAMILY HISTORY Family History  Problem Relation Age of Onset   Heart disease Neg Hx      Review of Systems: 12 systems reviewed Otherwise as per HPI, all other systems reviewed and negative  Physical Exam: Vitals:   12/05/22 0459 12/05/22 0749  BP: (!) 153/85 (!) 146/80  Pulse: 88 87  Resp: 16 20  Temp: 98.3 F (36.8 C) 98.2 F (36.8 C)  SpO2: 100% 94%   No intake/output data recorded.  Intake/Output Summary (Last 24 hours) at 12/05/2022 0758 Last data filed at 12/05/2022 1610 Gross per 24 hour  Intake 50 ml  Output 0 ml  Net 50 ml   General: well-appearing, no acute distress, laying  flat in bed HEENT: anicteric sclera, oropharynx clear without lesions CV: regular rate, normal rhythm, no murmurs, no gallops, no rubs Lungs: decreased breath sounds bibasilar, normal WOB, speaking in full sentences Abd: soft, non-tender, non-distended Skin: no visible lesions or rashes Psych: alert, engaged, appropriate mood and affect Ext: 2+ pitting edema b/l LE's Neuro: normal speech, no gross focal deficits, no asterixis   Test Results Reviewed Lab Results  Component Value Date   NA 137 12/05/2022   K 5.5 (H) 12/05/2022   CL 107 12/05/2022   CO2 10 (L) 12/05/2022   BUN 172 (H) 12/05/2022   CREATININE 20.32 (H) 12/05/2022   CALCIUM 7.1 (L) 12/05/2022   ALBUMIN 2.4 (L) 12/05/2022   PHOS 10.0 (H) 12/05/2022     I have reviewed all relevant outside healthcare records related to the patient's kidney injury.

## 2022-12-05 NOTE — Op Note (Signed)
Patient:  Damon Williams  DOB:  04-17-1961  MRN:  [id number]   Preop Diagnosis: Acute kidney injury, need for dialysis  Postop Diagnosis: Same  Procedure: Dialysis catheter insertion  Surgeon: Franky Macho, MD  Anes: Local  Indications: Patient is a 62 year old black male who is about to undergo dialysis for acute kidney injury and needs dialysis access.  The risks and benefits of the procedure including bleeding and infection were fully explained to the patient, who gave informed consent.  Procedure note: The patient was placed in supine position.  The procedure was done in the stepdown unit.  A timeout was performed.  Sterile technique was used.  1% Xylocaine was instilled into the right groin region.  The right femoral vein was accessed using the Seldinger technique without difficulty.  Guidewire was then advanced without difficulty.  A Tri dialysis catheter was then inserted.  Good backflow of venous blood was noted on aspiration of the ports.  All the ports were flushed with saline.  The catheter was secured in the place using a 3-0 Prolene interrupted suture.  A dry sterile dressing was applied.  The patient tolerated the procedure well.  The patient was transferred back to the floor in good and stable condition.  Complications: None  EBL: Minimal  Specimen: None

## 2022-12-05 NOTE — TOC Initial Note (Signed)
Transition of Care [facility]) - Initial/Assessment Note    Patient Details  Name: Damon Williams MRN: [id number] Date of Birth: 1961-07-04  Transition of [facility]) CM/SW Contact:    Elliot Gault, LCSW Phone Number: 12/05/2022, 10:55 AM  Clinical Narrative:                  Pt admitted from home. Per MD, pt will need outpatient HD at dc. Pt is currently under the care of [facility] in [place].   TOC CMA will initiate referral to [facility] tomorrow. Anticipating dc at the end of the week.  Expected Discharge Plan: Home/Self Care Barriers to Discharge: Continued Medical Work up   Patient Goals and CMS Choice Patient states their goals for this hospitalization and ongoing recovery are:: get better CMS Medicare.gov Compare Post Acute Care list provided to:: Patient Choice offered to / list presented to : Patient      Expected Discharge Plan and Services In-house Referral: Clinical Social Work   Post Acute Care Choice: Dialysis Living arrangements for the past 2 months: Single Family Home                                      Prior Living Arrangements/Services Living arrangements for the past 2 months: Single Family Home Lives with:: Spouse Patient language and need for interpreter reviewed:: Yes Do you feel safe going back to the place where you live?: Yes      Need for Family Participation in Patient Care: No (Comment) Care giver support system in place?: Yes (comment)   Criminal Activity/Legal Involvement Pertinent to Current Situation/Hospitalization: No - Comment as needed  Activities of Daily Living Home Assistive Devices/Equipment: None ADL Screening (condition at time of admission) Patient's cognitive ability adequate to safely complete daily activities?: Yes Is the patient deaf or have difficulty hearing?: No Does the patient have difficulty seeing, even when wearing glasses/contacts?: No Does the patient have difficulty  concentrating, remembering, or making decisions?: No Patient able to express need for assistance with ADLs?: Yes Does the patient have difficulty dressing or bathing?: No Independently performs ADLs?: Yes (appropriate for developmental age) Does the patient have difficulty walking or climbing stairs?: No Weakness of Legs: None Weakness of Arms/Hands: None  Permission Sought/Granted Permission sought to share information with : Facility Industrial/product designer granted to share information with : Yes, Verbal Permission Granted     Permission granted to share info w AGENCY: HD        Emotional Assessment Appearance:: Appears stated age Attitude/Demeanor/Rapport: Engaged Affect (typically observed): Pleasant Orientation: : Oriented to Self, Oriented to Place, Oriented to  Time, Oriented to Situation Alcohol / Substance Use: Not Applicable Psych Involvement: No (comment)  Admission diagnosis:  Acute hyperkalemia [E87.5] Uremia [N19] AKI (acute kidney injury) (HCC) [N17.9] Acute renal failure superimposed on stage 5 chronic kidney disease, not on chronic dialysis, unspecified acute renal failure type (HCC) [N17.9, N18.5] Patient Active Problem List   Diagnosis Date Noted   Uremia 12/05/2022   ESRD on hemodialysis (HCC) 12/05/2022   AKI (acute kidney injury) (HCC) 12/04/2022   Hyperkalemia 12/04/2022   Metabolic acidosis 12/04/2022   Cardiomyopathy (HCC) 11/05/2022   Acute renal failure (ARF) (HCC) 11/04/2022   Cardiomegaly 11/04/2022   Uncontrolled hypertension 11/04/2022   Anemia in chronic kidney disease (CKD) 11/04/2022   CKD (chronic kidney disease) 11/04/2022   Current smoker 11/04/2022  PCP:  Patient, No Pcp Per Pharmacy:   [id number] - [place], [place] - [address] [place] [address] Phone: [phone] Fax: [phone]  [id number] - [place], [place] - [address] [place] [address] Phone: [phone] Fax: [phone]  [id number] - [place], [place] - [address] AT [facility] [address]  [place] [address] Phone: [phone] Fax: [phone]     Social Determinants of Health (SDOH) Social History: SDOH Screenings   Food Insecurity: No Food Insecurity (12/04/2022)  Housing: Low Risk  (12/04/2022)  Transportation Needs: No Transportation Needs (12/04/2022)  Utilities: Not At Risk (12/04/2022)  Tobacco Use: High Risk (12/05/2022)   SDOH Interventions:     Readmission Risk Interventions    12/05/2022   10:54 AM  Readmission Risk Prevention Plan  Transportation Screening Complete  Home Care Screening Complete  Medication Review (RN CM) Complete

## 2022-12-05 NOTE — Progress Notes (Signed)
  INITIAL HEMODIALYSIS TREATMENT NOTE:  Indications / risks / benefits of hemodialysis were discussed at length with pt prior to obtaining consent for first ever treatment today.  Right temporary HD cath was inserted earlier today by Dr. Lovell Sheehan. Some sanguinous oozing noted from insertion site -- dressing was reinforced.  Pt had been OOB once post-insertion.  Indications for bedrest were explained.  Hopefully, can have tunneled cath placed soon.  Will change femoral dressing with tomorrow's treatment.    2 hour session completed.  Hypertensive throughout session.  Goal met: 1.5 liters removed. All blood was returned.    Post-dialysis:  12/05/22 2105  Vitals  Temp 97.9 F (36.6 C)  Temp Source Oral  BP (!) 171/99  MAP (mmHg) 121  BP Location Left Arm  BP Method Automatic  Patient Position (if appropriate) Sitting  Pulse Rate 91  Pulse Rate Source Monitor  ECG Heart Rate 92  Resp 18  Oxygen Therapy  SpO2 99 %  O2 Device Room Air  Post Treatment  Dialyzer Clearance Lightly streaked  Duration of HD Treatment -hour(s) 2 hour(s)  Hemodialysis Intake (mL) 0 mL  Liters Processed 24  Fluid Removed (mL) 1500 mL  Tolerated HD Treatment Yes  Post-Hemodialysis Comments Goal met  Hemodialysis Catheter Right Femoral vein Triple lumen Temporary (Non-Tunneled)  Placement Date/Time: 12/05/22 0959   Serial / Lot #: [id number]  Expiration Date: 03/11/23  Time Out: Correct patient;Correct site;Correct procedure  Maximum sterile barrier precautions: Hand hygiene;Cap;Mask;Sterile gown;Sterile gloves;Large sterile sh...  Site Condition No complications  Blue Lumen Status Flushed;Heparin locked;Dead end cap in place  Red Lumen Status Flushed;Heparin locked;Dead end cap in place  Purple Lumen Status Saline locked  Catheter fill solution Heparin 1000 units/ml  Catheter fill volume (Arterial) 1.5 cc  Catheter fill volume (Venous) 1.5  Dressing Type Gauze/Drain sponge  Dressing Status Old  drainage;Antimicrobial disc in place  Interventions Dressing reinforced  Drainage Description Serosanguineous  Dressing Change Due 12/06/22  Post treatment catheter status Capped and Clamped   Pt was returned to 338.  Hand-off given to Juanita Laster, RN.   Arman Filter, RN AP [facility]

## 2022-12-05 NOTE — Hospital Course (Signed)
62 y.o. male with medical history significant of uncontrolled high blood pressure, history of substance use, but no longer per his report, and more presents to the ED with a chief complaint of shortness of breath.  Patient reports he has not had any urine output in about 2 days.  He has not had any bowel movements either.  He has passed very little flatulence per his report.  He had no appetite and his abdomen feels bloated.  He had very little p.o. intake.  He reports a funny taste in his mouth.  It is not metallic, but it tastes like nasty powdery taste per his report.  He did recently have all of his teeth removed on 10 May.  He reports he has been taking his antibiotic and ibuprofen.  His ibuprofen he is only taking once or twice per day because his pain has not been that bad.  He reports no abdominal pain just the bloated feeling.  He has had no palpitations, no chest pain.  Patient reports he does have shortness of breath.  It is worse with exertion.  He has had peripheral edema as well.  He denies any muscle cramping.  He supposed be following up with nephrology on Tuesday.  He also has an appointment cardiology.  Patient reports a history of uncontrolled hypertension, but to his knowledge he is not a diabetic.  He has no other complaints at this time.   Patient quit smoking during his last hospitalization.  He does not drink alcohol anymore.  He has not touched any drugs since his last hospitalization.  Patient is very upset to hear that his creatinine is higher even though he has not been using alcohol or drugs.  He is vaccinated for COVID.  Patient is full code.

## 2022-12-05 NOTE — Progress Notes (Signed)
Medicated x1 for complaints of nausea and also for insomnia. Patient slept for approximately 4 hours this shift. Unable to void this shift. States he will move around and hopefully be able to urinate.

## 2022-12-05 NOTE — Progress Notes (Signed)
Pt taken to ICU room 12 for insertion of dialysis cath by Dr Lovell Sheehan at [phone]. Time out was performed at 0937. Pre procedure vs 186/93 and HR 85, rr 18. Procedure started at 0940. Procedure ended at 0959. Pt tolerated well. Post procedure vs 165/90, HR 75, RR 17. Pt take back to room 338 at 1009 in NAD.

## 2022-12-05 NOTE — Progress Notes (Signed)
PROGRESS NOTE   YEAB CODD  WUJ:[id number] DOB: 1961-03-08 DOA: 12/04/2022 PCP: Patient, No Pcp Per   Chief Complaint  Patient presents with   Shortness of Breath   Level of care: Telemetry  Brief Admission History:  62 y.o. male with medical history significant of uncontrolled high blood pressure, history of substance use, but no longer per his report, and more presents to the ED with a chief complaint of shortness of breath.  Patient reports he has not had any urine output in about 2 days.  He has not had any bowel movements either.  He has passed very little flatulence per his report.  He had no appetite and his abdomen feels bloated.  He had very little p.o. intake.  He reports a funny taste in his mouth.  It is not metallic, but it tastes like nasty powdery taste per his report.  He did recently have all of his teeth removed on 10 May.  He reports he has been taking his antibiotic and ibuprofen.  His ibuprofen he is only taking once or twice per day because his pain has not been that bad.  He reports no abdominal pain just the bloated feeling.  He has had no palpitations, no chest pain.  Patient reports he does have shortness of breath.  It is worse with exertion.  He has had peripheral edema as well.  He denies any muscle cramping.  He supposed be following up with nephrology on Tuesday.  He also has an appointment cardiology.  Patient reports a history of uncontrolled hypertension, but to his knowledge he is not a diabetic.  He has no other complaints at this time.   Patient quit smoking during his last hospitalization.  He does not drink alcohol anymore.  He has not touched any drugs since his last hospitalization.  Patient is very upset to hear that his creatinine is higher even though he has not been using alcohol or drugs.  He is vaccinated for COVID.  Patient is full code.   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 Lovell Sheehan placed temporary HD catheter in right femoral on 4/30 -HD to begin slow start on 5/27 and 5/28 per Dr. Romelle Starcher -TOC consulted for CLIP initiation -VVS consult prior to discharge home  Metabolic acidosis with Anion Gap  -secondary to ESRD  -Pt will be starting hemodialysis on 5/27   Hyperkalemia -K+ 6.2 -- down to 5.5 after lokelma doses  -Hemodialysis to start 5/27   Uncontrolled hypertension -continue norvasc, coreg, hydralazine -UF as tolerated  Acute HFrEF / Cardiomyopathy  - He will begin hemodialysis slow start on 5/27 and 5/28 for volume removal - continue cardiac medication - he will likely miss his outpatient follow up with cardiology scheduled for 5/28  Anemia in CKD  - follow daily CBC - transfuse for hg<7 - nephrology team starting arenesp 60 mcg qMon starting 5/27  H/o recreational drug use  - pt reports he only used cocaine intermittently and has stopped completely  Tobacco use - strongly advised against ongoing tobacco use - will offer nicotine patch for cravings   DVT prophylaxis: [place] heparin  Code Status: Full  Family Communication:  Disposition: Status is: Inpatient Remains inpatient appropriate because: intensity    Consultants:  Nephrology Surgery  [facility] for CLIP  Vascular surgery (pending)  Procedures:  Temp dialysis catheter placement right femoral 5/27 Dr. Lovell Sheehan  Antimicrobials:    Subjective: Pt says that he had  not been able to eat due to nausea and malaise over last couple of days.   Objective: Vitals:   12/05/22 0104 12/05/22 0459 12/05/22 0749 12/05/22 1035  BP: 136/73 (!) 153/85 (!) 146/80 (!) 156/93  Pulse: 91 88 87 79  Resp: 18 16 20    Temp: 98.3 F (36.8 C) 98.3 F (36.8 C) 98.2 F (36.8 C) (!) 97.4 F (36.3 C)  TempSrc: Oral Oral Oral Oral  SpO2: 96% 100% 94% 95%  Weight:      Height:        Intake/Output Summary (Last 24 hours) at  12/05/2022 1043 Last data filed at 12/05/2022 1610 Gross per 24 hour  Intake 50 ml  Output 0 ml  Net 50 ml   Filed Weights   12/04/22 1748 12/04/22 2042  Weight: 69.9 kg 74.4 kg   Examination:  General exam: thin, frail male, awake, alert, cooperative.   Respiratory system: Clear to auscultation. Respiratory effort normal. Cardiovascular system: normal S1 & S2 heard. No JVD, murmurs, rubs, gallops or clicks. No pedal edema. Gastrointestinal system: Abdomen is nondistended, soft and nontender. No organomegaly or masses felt. Normal bowel sounds heard. Central nervous system: Alert and oriented. No focal neurological deficits. Extremities: Symmetric 5 x 5 power. Skin: No rashes, lesions or ulcers. Psychiatry: Judgement and insight appear normal. Mood & affect appropriate.   Data Reviewed: I have personally reviewed following labs and imaging studies  CBC: Recent Labs  Lab 12/04/22 1800 12/05/22 0508  WBC 4.1 5.3  NEUTROABS  --  3.0  HGB 8.0* 7.5*  HCT 24.1* 22.1*  MCV 92.7 93.6  PLT 123* 115*    Basic Metabolic Panel: Recent Labs  Lab 12/04/22 1800 12/05/22 0010 12/05/22 0508  NA 136  --  137  K 6.2* 5.5* 5.5*  CL 107  --  107  CO2 10*  --  10*  GLUCOSE 95  --  85  BUN 166*  --  172*  CREATININE 19.37*  --  20.32*  CALCIUM 7.1*  --  7.1*  MG  --   --  2.3  PHOS  --   --  10.0*    CBG: No results for input(s): "GLUCAP" in the last 168 hours.  No results found for this or any previous visit (from the past 240 hour(s)).   Radiology Studies: CT Renal Stone Study  Result Date: 12/04/2022 CLINICAL DATA:  New onset renal failure. EXAM: CT ABDOMEN AND PELVIS WITHOUT CONTRAST TECHNIQUE: Multidetector CT imaging of the abdomen and pelvis was performed following the standard protocol without IV contrast. RADIATION DOSE REDUCTION: This exam was performed according to the departmental dose-optimization program which includes automated exposure control, adjustment of the  mA and/or kV according to patient size and/or use of iterative reconstruction technique. COMPARISON:  None Available. FINDINGS: Lower chest: Enlarged heart. Hepatobiliary: No focal liver abnormality is seen. No gallstones, gallbladder wall thickening, or biliary dilatation. Pancreas: Unremarkable. No pancreatic ductal dilatation or surrounding inflammatory changes. Spleen: Normal in size without focal abnormality. Adrenals/Urinary Tract: Adrenal glands are unremarkable. Kidneys are normal, without renal calculi, focal lesion, or hydronephrosis. Bladder is unremarkable. Stomach/Bowel: Stomach is within normal limits. Appendix appears normal. No evidence of bowel wall thickening, distention, or inflammatory changes. Diffuse colonic diverticulosis without evidence of diverticulitis. Vascular/Lymphatic: No significant vascular findings are present. No enlarged abdominal or pelvic lymph nodes. Reproductive: Prostate is  unremarkable. Other: Diffuse mesenteric stranding, particularly prominent in the upper abdomen. Associated retroperitoneal fat stranding. Musculoskeletal: No acute or significant osseous findings. IMPRESSION: 1. Diffuse mesenteric stranding, particularly prominent in the upper abdomen. Associated retroperitoneal fat stranding. Findings are nonspecific, but may be seen in the setting of mesenteric panniculitis or third spacing. 2. Enlarged heart. 3. Left colonic diverticulosis. Electronically Signed   By: Ted Mcalpine M.D.   On: 12/04/2022 19:32   DG Chest 2 View  Result Date: 12/04/2022 CLINICAL DATA:  Shortness of breath and mid abdominal pain. EXAM: CHEST - 2 VIEW COMPARISON:  October 31, 2022 FINDINGS: The cardiac silhouette is mildly enlarged and unchanged in size. Mild, diffusely increased interstitial lung markings are noted. This is decreased in severity when compared to the prior study. There is no evidence of focal consolidation, pleural effusion or pneumothorax. The visualized skeletal  structures are unremarkable. IMPRESSION: Stable cardiomegaly with additional findings that may represent mild interstitial edema. Electronically Signed   By: Aram Candela M.D.   On: 12/04/2022 18:39    Scheduled Meds:  amLODipine  10 mg Oral Daily   calcium acetate  667 mg Oral TID WC   carvedilol  6.25 mg Oral BID WC   Chlorhexidine Gluconate Cloth  6 each Topical Q0600   darbepoetin (ARANESP) injection - DIALYSIS  60 mcg Subcutaneous Q Mon-1800   heparin  5,000 Units Subcutaneous Q8H   hydrALAZINE  25 mg Oral TID   isosorbide mononitrate  15 mg Oral Daily   melatonin  6 mg Oral QHS   sodium zirconium cyclosilicate  10 g Oral TID   Continuous Infusions:   ceFAZolin (ANCEF) IV       LOS: 1 day   Time spent: 44 mins  Deborrah Mabin Laural Benes, MD How to contact the [facility] Attending or Consulting provider 7A - 7P or covering provider during after hours 7P -7A, for this patient?  Check the care team in [facility] and look for a) attending/consulting TR[facility] provider listed and b) the [facility] team listed Log into [email or web address] and use [place]'s universal password to access. If you do not have the password, please contact the hospital operator. Locate the [facility] provider you are looking for under [facility] and page to a number that you can be directly reached. If you still have difficulty reaching the provider, please page the [facility] (Director on Call) for the Hospitalists listed on amion for assistance.  12/05/2022, 10:43 AM

## 2022-12-06 ENCOUNTER — Encounter: Payer: Self-pay | Admitting: Cardiology

## 2022-12-06 ENCOUNTER — Ambulatory Visit: Payer: Medicaid Other | Admitting: Cardiology

## 2022-12-06 DIAGNOSIS — E872 Acidosis, unspecified: Secondary | ICD-10-CM | POA: Diagnosis not present

## 2022-12-06 DIAGNOSIS — Z992 Dependence on renal dialysis: Secondary | ICD-10-CM | POA: Diagnosis not present

## 2022-12-06 DIAGNOSIS — E875 Hyperkalemia: Secondary | ICD-10-CM | POA: Diagnosis not present

## 2022-12-06 DIAGNOSIS — N19 Unspecified kidney failure: Secondary | ICD-10-CM | POA: Diagnosis not present

## 2022-12-06 LAB — CBC
HCT: 24.2 % — ABNORMAL LOW (ref 39.0–52.0)
Hemoglobin: 8.2 g/dL — ABNORMAL LOW (ref 13.0–17.0)
MCH: 31.1 pg (ref 26.0–34.0)
MCHC: 33.9 g/dL (ref 30.0–36.0)
MCV: 91.7 fL (ref 80.0–100.0)
Platelets: 147 10*3/uL — ABNORMAL LOW (ref 150–400)
RBC: 2.64 MIL/uL — ABNORMAL LOW (ref 4.22–5.81)
RDW: 14.3 % (ref 11.5–15.5)
WBC: 4 10*3/uL (ref 4.0–10.5)
nRBC: 0 % (ref 0.0–0.2)

## 2022-12-06 LAB — HEPATITIS B CORE ANTIBODY, TOTAL: Hep B Core Total Ab: NONREACTIVE

## 2022-12-06 LAB — RENAL FUNCTION PANEL
Albumin: 2.6 g/dL — ABNORMAL LOW (ref 3.5–5.0)
Anion gap: 19 — ABNORMAL HIGH (ref 5–15)
BUN: 133 mg/dL — ABNORMAL HIGH (ref 6–20)
CO2: 15 mmol/L — ABNORMAL LOW (ref 22–32)
Calcium: 7.3 mg/dL — ABNORMAL LOW (ref 8.9–10.3)
Chloride: 103 mmol/L (ref 98–111)
Creatinine, Ser: 16.86 mg/dL — ABNORMAL HIGH (ref 0.61–1.24)
GFR, Estimated: 3 mL/min — ABNORMAL LOW (ref >60)
Glucose, Bld: 91 mg/dL (ref 70–99)
Phosphorus: 8.2 mg/dL — ABNORMAL HIGH (ref 2.5–4.6)
Potassium: 4.4 mmol/L (ref 3.5–5.1)
Sodium: 137 mmol/L (ref 135–145)

## 2022-12-06 LAB — HEPATITIS B SURFACE ANTIGEN
Hepatitis B Surface Ag: NONREACTIVE
Hepatitis B Surface Ag: NONREACTIVE

## 2022-12-06 LAB — MAGNESIUM: Magnesium: 2 mg/dL (ref 1.7–2.4)

## 2022-12-06 MED ORDER — CEFAZOLIN SODIUM-DEXTROSE 2-4 GM/100ML-% IV SOLN: 2.0000 g | Freq: Once | INTRAVENOUS | Status: DC

## 2022-12-06 MED ORDER — HEPARIN SODIUM (PORCINE) 1000 UNIT/ML IJ SOLN
INTRAMUSCULAR | Status: AC
  Filled 2022-12-06: qty 4

## 2022-12-06 NOTE — Consult Note (Signed)
[facility] [place]  Patient name: Damon Williams MRN: [id number] DOB: 1961/03/29 Sex: male  REASON FOR CONSULT: Discuss permanent access for hemodialysis  HPI: Damon Williams is a 62 y.o. male, who is admitted to [facility] with end-stage renal disease.  He had been followed with progressive renal insufficiency and was felt to be CKD 5.  He presented with worsening shortness of breath, nausea and leg swelling was felt to be end-stage renal disease.  He had a temporary catheter placed for urgent hemodialysis by Dr. Lovell Sheehan.  He has plans to be transferred to [facility] for tunneled hemodialysis placement and then return to [facility].  I was consulted for discussion of permanent access.  Patient has never been on hemodialysis in the past.  He is not on anticoagulation.  He does not have a pacemaker.  He is right-handed.  Past Medical History:  Diagnosis Date   Cardiomyopathy (HCC)    CKD    Cocaine use    ESRD (end stage renal disease) on dialysis (HCC) 12/05/2022   Hypertension    Tobacco abuse     Family History  Problem Relation Age of Onset   Heart disease Neg Hx     SOCIAL HISTORY: Social History   Socioeconomic History   Marital status: Married    Spouse name: Not on file   Number of children: Not on file   Years of education: Not on file   Highest education level: Not on file  Occupational History   Not on file  Tobacco Use   Smoking status: Every Day    Packs/day: 1.00    Years: 30.00    Additional pack years: 0.00    Total pack years: 30.00    Types: Cigarettes   Smokeless tobacco: Never  Vaping Use   Vaping Use: Never used  Substance and Sexual Activity   Alcohol use: Not Currently    Comment: quit 1989   Drug use: Yes    Types: Codeine   Sexual activity: Yes    Birth control/protection: None  Other Topics Concern   Not on file  Social History Narrative   Not on file   Social  Determinants of Health   Financial Resource Strain: Not on file  Food Insecurity: No Food Insecurity (12/04/2022)   Hunger Vital Sign    Worried About Running Out of Food in the Last Year: Never true    Ran Out of Food in the Last Year: Never true  Transportation Needs: No Transportation Needs (12/04/2022)   PRAPARE - Administrator, Civil Service (Medical): No    Lack of Transportation (Non-Medical): No  Physical Activity: Not on file  Stress: Not on file  Social Connections: Not on file  Intimate Partner Violence: Not At Risk (12/04/2022)   Humiliation, Afraid, Rape, and Kick questionnaire    Fear of Current or Ex-Partner: No    Emotionally Abused: No    Physically Abused: No    Sexually Abused: No    No Known Allergies  Current Facility-Administered Medications  Medication Dose Route Frequency Provider Last Rate Last Admin   acetaminophen (TYLENOL) tablet 650 mg  650 mg Oral Q6H PRN Zierle-Ghosh, Asia B, DO       Or  acetaminophen (TYLENOL) suppository 650 mg  650 mg Rectal Q6H PRN Zierle-Ghosh, Asia B, DO       alteplase (CATHFLO ACTIVASE) injection 2 mg  2 mg Intracatheter Once PRN Anthony Sar, MD       amLODipine (NORVASC) tablet 10 mg  10 mg Oral Daily Zierle-Ghosh, Asia B, DO   10 mg at 12/05/22 1038   calcium acetate (PHOSLO) capsule 667 mg  667 mg Oral TID WC Anthony Sar, MD   667 mg at 12/06/22 4098   carvedilol (COREG) tablet 6.25 mg  6.25 mg Oral BID WC Zierle-Ghosh, Asia B, DO   6.25 mg at 12/06/22 1191   ceFAZolin (ANCEF) IVPB 2g/100 mL premix  2 g Intravenous 30 min Pre-Op Franky Macho, MD       Chlorhexidine Gluconate Cloth 2 % PADS 6 each  6 each Topical Q0600 Anthony Sar, MD   6 each at 12/06/22 0557   Darbepoetin Alfa (ARANESP) injection 60 mcg  60 mcg Subcutaneous Q Mon-1800 Anthony Sar, MD   60 mcg at 12/05/22 1758   heparin injection 1,000 Units  1,000 Units Intracatheter PRN Anthony Sar, MD   3,000 Units at 12/05/22 2105   heparin injection  5,000 Units  5,000 Units Subcutaneous Q8H Zierle-Ghosh, Asia B, DO   5,000 Units at 12/05/22 1320   hydrALAZINE (APRESOLINE) tablet 25 mg  25 mg Oral TID Zierle-Ghosh, Asia B, DO   25 mg at 12/06/22 0930   isosorbide mononitrate (IMDUR) 24 hr tablet 15 mg  15 mg Oral Daily Zierle-Ghosh, Asia B, DO   15 mg at 12/06/22 4782   melatonin tablet 6 mg  6 mg Oral QHS Zierle-Ghosh, Asia B, DO   6 mg at 12/05/22 2137   nicotine (NICODERM CQ - dosed in mg/24 hours) patch 14 mg  14 mg Transdermal Daily PRN Johnson, Clanford L, MD       ondansetron (ZOFRAN) tablet 4 mg  4 mg Oral Q6H PRN Zierle-Ghosh, Asia B, DO       Or   ondansetron (ZOFRAN) injection 4 mg  4 mg Intravenous Q6H PRN Zierle-Ghosh, Asia B, DO   4 mg at 12/05/22 0545   oxyCODONE (Oxy IR/ROXICODONE) immediate release tablet 5 mg  5 mg Oral Q4H PRN Zierle-Ghosh, Asia B, DO        REVIEW OF SYSTEMS:  [X]  denotes positive finding, [ ]  denotes negative finding Cardiac  Comments:  Chest pain or chest pressure:    Shortness of breath upon exertion: x   Short of breath when lying flat:    Irregular heart rhythm:        Vascular    Pain in calf, thigh, or hip brought on by ambulation:    Pain in feet at night that wakes you up from your sleep:     Blood clot in your veins:    Leg swelling:  x       Pulmonary    Oxygen at home:    Productive cough:     Wheezing:         Neurologic    Sudden weakness in arms or legs:     Sudden numbness in arms or legs:     Sudden onset of difficulty speaking or slurred speech:    Temporary loss of vision in one eye:     Problems with dizziness:         Gastrointestinal    Blood in stool:     Vomited blood:  Genitourinary    Burning when urinating:     Blood in urine:        Psychiatric    Major depression:         Hematologic    Bleeding problems:    Problems with blood clotting too easily:        Skin    Rashes or ulcers:        Constitutional    Fever or chills:       PHYSICAL EXAM: Vitals:   12/05/22 2105 12/05/22 2123 12/06/22 0300 12/06/22 0455  BP: (!) 171/99 (!) 173/97 (!) 156/89 (!) 174/96  Pulse: 91 93  98  Resp: 18 18  18   Temp: 97.9 F (36.6 C) 98.3 F (36.8 C)  98.5 F (36.9 C)  TempSrc: Oral Oral  Oral  SpO2: 99% 100%  99%  Weight:    76.6 kg  Height:        GENERAL: The patient is a well-nourished male, in no acute distress. The vital signs are documented above. CARDIOVASCULAR: 2+ radial pulses bilaterally.  Moderate cephalic vein at the antecubital space and also moderate basilic vein at the level of the elbow PULMONARY: There is good air exchange  MUSCULOSKELETAL: There are no major deformities or cyanosis. NEUROLOGIC: No focal weakness or paresthesias are detected. SKIN: There are no ulcers or rashes noted. PSYCHIATRIC: The patient has a normal affect.  DATA:  None  MEDICAL ISSUES: Had long discussion with the patient regarding options for hemodialysis.  I discussed that tunneled hemodialysis catheter which will be placed by interventional radiology.  I explained that this generally gives good use for several months without difficulty.  I did discussion the option of AV fistula versus AV graft.  He is right-handed so we will plan for a left arm access.  I discussed the benefits and limitations of both AV fistula and AV graft.  He does appear to be a candidate for fistula and explained that we would make that final determination at the time of surgery.  We will coordinate a new left arm access as an outpatient in the next 2 to 3 weeks following discharge and establishment of outpatient dialysis.  I explained to him that our office will coordinate this with him.  We will tentatively plan for his surgery date for Tuesday, December 27, 2022 at [facility]   Larina Earthly, MD [facility] Tel [phone] Pager [phone]  Note: Portions of this report may have been  transcribed using voice recognition software.  Every effort has been made to ensure accuracy; however, inadvertent computerized transcription errors may still be present.

## 2022-12-06 NOTE — Discharge Instructions (Signed)
  Providers Accepting New Patients in [place], [place]    [facility] [address]  [place], [place] [address] [phone] Accepts most insurances  [facility] [address] [place], [place] [address] [phone] Accepts most insurances  [facility] [place] [address] [place], [place] [address]  [phone] Must meet requirements  [facility] [address]  [place], [place] [address] [phone] Accepts most insurances  [facility] [address]  [place], [place] [address] [phone] Accepts most insurances  [facility] [address]   [place], [place]   [phone] Accepts most insurances  [facility] ([place] [facility])  [address]  [place], [place] [address] [phone] Accepts most insurances     [facility] [address]  [place], [place] [address] [phone] Accepts most insurances  [facility] [facility] [address] [place], [place] [address] [phone] option 1 Accepts Medicaid and Uninsured  [facility] [address]  [place], [place] [address] [phone] Accepts most insurances  Tesfaye Fanta, MD [address]  [place], [place] [address] [phone] Accepts most insurances  [facility] [place] [address]  [place], [place] [address] [phone] Accepts most insurances  [facility] [address] [place], [place] [address] [phone] Accepts most insurances  Zack Hall, MD [address] ,  [address] [phone]  Accepts most insurances                      

## 2022-12-06 NOTE — Progress Notes (Signed)
Dr. Carren Rang notified through secure chat that 2200 and 0600 SQ Heparin was held due to new dialysis catheter dressing with new blood. Dressing reinforced.

## 2022-12-06 NOTE — Progress Notes (Signed)
PROGRESS NOTE   Damon Williams  MWU:[id number] DOB: 1961/04/04 DOA: 12/04/2022 PCP: Patient, No Pcp Per   Chief Complaint  Patient presents with   Shortness of Breath   Level of care: Telemetry  Brief Admission History:  62 y.o. male with medical history significant of uncontrolled high blood pressure, history of substance use, but no longer per his report, and more presents to the ED with a chief complaint of shortness of breath.  Patient reports he has not had any urine output in about 2 days.  He has not had any bowel movements either.  He has passed very little flatulence per his report.  He had no appetite and his abdomen feels bloated.  He had very little p.o. intake.  He reports a funny taste in his mouth.  It is not metallic, but it tastes like nasty powdery taste per his report.  He did recently have all of his teeth removed on 10 May.  He reports he has been taking his antibiotic and ibuprofen.  His ibuprofen he is only taking once or twice per day because his pain has not been that bad.  He reports no abdominal pain just the bloated feeling.  He has had no palpitations, no chest pain.  Patient reports he does have shortness of breath.  It is worse with exertion.  He has had peripheral edema as well.  He denies any muscle cramping.  He supposed be following up with nephrology on Tuesday.  He also has an appointment cardiology.  Patient reports a history of uncontrolled hypertension, but to his knowledge he is not a diabetic.  He has no other complaints at this time.   Patient quit smoking during his last hospitalization.  He does not drink alcohol anymore.  He has not touched any drugs since his last hospitalization.  Patient is very upset to hear that his creatinine is higher even though he has not been using alcohol or drugs.  He is vaccinated for COVID.  Patient is full code.   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 Lovell Sheehan placed temporary HD catheter in right femoral on 4/30 -HD to begin slow start on 5/27 and 5/28 per Dr. Romelle Starcher -TOC consulted for CLIP -VVS consult prior to discharge home - seen by Dr. Arbie Cookey and scheduled for AV fistula/graft surgery 12/27/22. -pt wants to discuss renal transplant with his nephrology team  Metabolic acidosis with Anion Gap  -secondary to ESRD  -Pt will be starting hemodialysis on 5/27   Hyperkalemia -K+ 6.2 -- down to 5.5 after lokelma doses  -Hemodialysis 5/27 and hyperkalemia corrected -DC lokelma   Uncontrolled hypertension -continue norvasc, coreg, hydralazine -UF as tolerated  Acute HFrEF / Cardiomyopathy  - He will begin hemodialysis slow start on 5/27 and 5/28 for volume removal - continue cardiac medication - he will miss his outpatient follow up with cardiology scheduled for 5/28 and will reschedule  Anemia in CKD  - follow daily CBC - transfuse for hg<7 - nephrology team starting arenesp 60 mcg qMon starting 5/27  H/o recreational drug use  - pt reports he only used cocaine intermittently and has stopped completely  Tobacco use - strongly advised against ongoing tobacco use - will offer nicotine patch for cravings   DVT prophylaxis: [place] heparin  Code Status: Full  Family Communication:  Disposition: Status is: Inpatient Remains inpatient appropriate because: intensity    Consultants:  Nephrology Surgery  [facility] for CLIP  Vascular surgery (pending)  Procedures:  Temp dialysis catheter placement right femoral 5/27 Dr. Lovell Sheehan  Antimicrobials:    Subjective: Pt tolerated his first HD treatment well, he had some bleeding from temp HD cath but it stopped now.     Objective: Vitals:   12/05/22 2105 12/05/22 2123 12/06/22 0300 12/06/22 0455  BP: (!) 171/99 (!) 173/97 (!) 156/89 (!) 174/96  Pulse: 91 93  98  Resp: 18 18  18   Temp: 97.9 F (36.6 C) 98.3 F  (36.8 C)  98.5 F (36.9 C)  TempSrc: Oral Oral  Oral  SpO2: 99% 100%  99%  Weight:    76.6 kg  Height:        Intake/Output Summary (Last 24 hours) at 12/06/2022 1120 Last data filed at 12/06/2022 0900 Gross per 24 hour  Intake 360 ml  Output 1500 ml  Net -1140 ml   Filed Weights   12/04/22 2042 12/05/22 1840 12/06/22 0455  Weight: 74.4 kg 74.9 kg 76.6 kg   Examination:  General exam: thin, frail male, awake, alert, cooperative.   Respiratory system: Clear to auscultation. Respiratory effort normal. Cardiovascular system: normal S1 & S2 heard. No JVD, murmurs, rubs, gallops or clicks. No pedal edema. Gastrointestinal system: Abdomen is nondistended, soft and nontender. No organomegaly or masses felt. Normal bowel sounds heard. Central nervous system: Alert and oriented. No focal neurological deficits. Extremities: Symmetric 5 x 5 power. Skin: No rashes, lesions or ulcers. Psychiatry: Judgement and insight appear normal. Mood & affect appropriate.   Data Reviewed: I have personally reviewed following labs and imaging studies  CBC: Recent Labs  Lab 12/04/22 1800 12/05/22 0508 12/06/22 0438  WBC 4.1 5.3 4.0  NEUTROABS  --  3.0  --   HGB 8.0* 7.5* 8.2*  HCT 24.1* 22.1* 24.2*  MCV 92.7 93.6 91.7  PLT 123* 115* 147*    Basic Metabolic Panel: Recent Labs  Lab 12/04/22 1800 12/05/22 0010 12/05/22 0508 12/06/22 0438  NA 136  --  137 137  K 6.2* 5.5* 5.5* 4.4  CL 107  --  107 103  CO2 10*  --  10* 15*  GLUCOSE 95  --  85 91  BUN 166*  --  172* 133*  CREATININE 19.37*  --  20.32* 16.86*  CALCIUM 7.1*  --  7.1* 7.3*  MG  --   --  2.3 2.0  PHOS  --   --  10.0* 8.2*    CBG: No results for input(s): "GLUCAP" in the last 168 hours.  No results found for this or any previous visit (from the past 240 hour(s)).   Radiology Studies: CT Renal Stone Study  Result Date: 12/04/2022 CLINICAL DATA:  New onset renal failure. EXAM: CT ABDOMEN AND PELVIS WITHOUT CONTRAST  TECHNIQUE: Multidetector CT imaging of the abdomen and pelvis was performed following the standard protocol without IV contrast. RADIATION DOSE REDUCTION: This exam was performed according to the departmental dose-optimization program which includes automated exposure control, adjustment of the mA and/or kV according to patient size and/or use of iterative reconstruction technique. COMPARISON:  None Available. FINDINGS: Lower chest: Enlarged heart. Hepatobiliary: No focal liver abnormality is seen. No gallstones, gallbladder wall thickening, or biliary dilatation. Pancreas: Unremarkable. No pancreatic ductal dilatation or surrounding inflammatory changes. Spleen: Normal in size without focal abnormality. Adrenals/Urinary Tract: Adrenal glands are unremarkable. Kidneys are normal, without renal  calculi, focal lesion, or hydronephrosis. Bladder is unremarkable. Stomach/Bowel: Stomach is within normal limits. Appendix appears normal. No evidence of bowel wall thickening, distention, or inflammatory changes. Diffuse colonic diverticulosis without evidence of diverticulitis. Vascular/Lymphatic: No significant vascular findings are present. No enlarged abdominal or pelvic lymph nodes. Reproductive: Prostate is unremarkable. Other: Diffuse mesenteric stranding, particularly prominent in the upper abdomen. Associated retroperitoneal fat stranding. Musculoskeletal: No acute or significant osseous findings. IMPRESSION: 1. Diffuse mesenteric stranding, particularly prominent in the upper abdomen. Associated retroperitoneal fat stranding. Findings are nonspecific, but may be seen in the setting of mesenteric panniculitis or third spacing. 2. Enlarged heart. 3. Left colonic diverticulosis. Electronically Signed   By: Ted Mcalpine M.D.   On: 12/04/2022 19:32   DG Chest 2 View  Result Date: 12/04/2022 CLINICAL DATA:  Shortness of breath and mid abdominal pain. EXAM: CHEST - 2 VIEW COMPARISON:  October 31, 2022 FINDINGS:  The cardiac silhouette is mildly enlarged and unchanged in size. Mild, diffusely increased interstitial lung markings are noted. This is decreased in severity when compared to the prior study. There is no evidence of focal consolidation, pleural effusion or pneumothorax. The visualized skeletal structures are unremarkable. IMPRESSION: Stable cardiomegaly with additional findings that may represent mild interstitial edema. Electronically Signed   By: Aram Candela M.D.   On: 12/04/2022 18:39    Scheduled Meds:  amLODipine  10 mg Oral Daily   calcium acetate  667 mg Oral TID WC   carvedilol  6.25 mg Oral BID WC   Chlorhexidine Gluconate Cloth  6 each Topical Q0600   darbepoetin (ARANESP) injection - DIALYSIS  60 mcg Subcutaneous Q Mon-1800   heparin  5,000 Units Subcutaneous Q8H   hydrALAZINE  25 mg Oral TID   isosorbide mononitrate  15 mg Oral Daily   melatonin  6 mg Oral QHS   Continuous Infusions:   ceFAZolin (ANCEF) IV       LOS: 2 days   Time spent: 36 mins  Damon Strawder Laural Benes, MD How to contact the [facility] Attending or Consulting provider 7A - 7P or covering provider during after hours 7P -7A, for this patient?  Check the care team in [facility] and look for a) attending/consulting TRH provider listed and b) the [facility] team listed Log into [email or web address] and use [place]'s universal password to access. If you do not have the password, please contact the hospital operator. Locate the [facility] provider you are looking for under [facility] and page to a number that you can be directly reached. If you still have difficulty reaching the provider, please page the [facility] (Director on Call) for the Hospitalists listed on amion for assistance.  12/06/2022, 11:20 AM

## 2022-12-06 NOTE — Progress Notes (Signed)
Admit: 12/04/2022 LOS: 2  34M new ESRD, HTN  Subjective:  HD #1 yesterday using R fem temp HD cath placed by Dr. Gustavus Bryant Fem HD cath overnight 1.5L UF, uneventful therapy No c/o this AM  TOC working on CLIP  05/27 0701 - 05/28 0700 In: 360 [P.O.:360] Out: 1500   Filed Weights   12/04/22 2042 12/05/22 1840 12/06/22 0455  Weight: 74.4 kg 74.9 kg 76.6 kg    Scheduled Meds:  amLODipine  10 mg Oral Daily   calcium acetate  667 mg Oral TID WC   carvedilol  6.25 mg Oral BID WC   Chlorhexidine Gluconate Cloth  6 each Topical Q0600   darbepoetin (ARANESP) injection - DIALYSIS  60 mcg Subcutaneous Q Mon-1800   heparin  5,000 Units Subcutaneous Q8H   hydrALAZINE  25 mg Oral TID   isosorbide mononitrate  15 mg Oral Daily   melatonin  6 mg Oral QHS   Continuous Infusions:   ceFAZolin (ANCEF) IV     PRN Meds:.acetaminophen **OR** acetaminophen, alteplase, heparin, nicotine, ondansetron **OR** ondansetron (ZOFRAN) IV, oxyCODONE  Current Labs: reviewed   Latest Reference Range & Units 12/04/22 18:00  Saturation Ratios 17.9 - 39.5 % 37  Ferritin 24 - 336 ng/mL 816 (H)  (H): Data is abnormally high  Physical Exam:  Blood pressure (!) 174/96, pulse 98, temperature 98.5 F (36.9 C), temperature source Oral, resp. rate 18, height 5\' 7"  (1.702 m), weight 76.6 kg, SpO2 99 %. NAD, conversant RRR nl s1s2 no rub CTAB 1+ LEE b/l pitting Nonfocal, CN2-12 intact, AAO x3 S/nt/nd No rashes/lesions R Fem Temp HD cath bandaged with sig oozing  A New ESRD, Uremia Start HD 5/27 CLIP In process Has R Fem HD Temp cath placed 5/27 Dr. Lovell Sheehan IR consulted for [facility] Dr. Arbie Cookey to see today for permanent access plan HD #2 today, 2L UF, 2.5h HTN, cause of #1, not yet controlled Simplify regimen in time First get vol down THen probably ween off Hydral/ISMN, inc BB, can add ARB and mabye MRB Hyperkalemia, resolved with HD Anemia: Fe replete, ESA today Hyperphosphatemia / BMM: follow with  HD, on PHosLo; last PTH ok Vascular access: as above Met acidosis, stop NaHCO3, will correct with HD in time AoC HFrEF, largely as above  P As above, HD today Medication Issues; Preferred narcotic agents for pain control are hydromorphone, fentanyl, and methadone. Morphine should not be used.  Baclofen should be avoided Avoid oral sodium phosphate and magnesium citrate based laxatives / bowel preps    Sabra Heck MD 12/06/2022, 9:18 AM  Recent Labs  Lab 12/04/22 1800 12/05/22 0010 12/05/22 0508 12/06/22 0438  NA 136  --  137 137  K 6.2* 5.5* 5.5* 4.4  CL 107  --  107 103  CO2 10*  --  10* 15*  GLUCOSE 95  --  85 91  BUN 166*  --  172* 133*  CREATININE 19.37*  --  20.32* 16.86*  CALCIUM 7.1*  --  7.1* 7.3*  PHOS  --   --  10.0* 8.2*   Recent Labs  Lab 12/04/22 1800 12/05/22 0508 12/06/22 0438  WBC 4.1 5.3 4.0  NEUTROABS  --  3.0  --   HGB 8.0* 7.5* 8.2*  HCT 24.1* 22.1* 24.2*  MCV 92.7 93.6 91.7  PLT 123* 115* 147*

## 2022-12-06 NOTE — H&P (View-Only) (Signed)
                                    [facility] [place]  Patient name: Damon Williams MRN: [id number] DOB: 06/05/1961 Sex: male  REASON FOR CONSULT: Discuss permanent access for hemodialysis  HPI: Damon Williams is a 61 y.o. male, who is admitted to [facility] with end-stage renal disease.  He had been followed with progressive renal insufficiency and was felt to be CKD 5.  He presented with worsening shortness of breath, nausea and leg swelling was felt to be end-stage renal disease.  He had a temporary catheter placed for urgent hemodialysis by Dr. Jenkins.  He has plans to be transferred to [place] for tunneled hemodialysis placement and then return to [place].  I was consulted for discussion of permanent access.  Patient has never been on hemodialysis in the past.  He is not on anticoagulation.  He does not have a pacemaker.  He is right-handed.  Past Medical History:  Diagnosis Date   Cardiomyopathy (HCC)    CKD    Cocaine use    ESRD (end stage renal disease) on dialysis (HCC) 12/05/2022   Hypertension    Tobacco abuse     Family History  Problem Relation Age of Onset   Heart disease Neg Hx     SOCIAL HISTORY: Social History   Socioeconomic History   Marital status: Married    Spouse name: Not on file   Number of children: Not on file   Years of education: Not on file   Highest education level: Not on file  Occupational History   Not on file  Tobacco Use   Smoking status: Every Day    Packs/day: 1.00    Years: 30.00    Additional pack years: 0.00    Total pack years: 30.00    Types: Cigarettes   Smokeless tobacco: Never  Vaping Use   Vaping Use: Never used  Substance and Sexual Activity   Alcohol use: Not Currently    Comment: quit 1989   Drug use: Yes    Types: Codeine   Sexual activity: Yes    Birth control/protection: None  Other Topics Concern   Not on file  Social History Narrative   Not on file   Social  Determinants of Health   Financial Resource Strain: Not on file  Food Insecurity: No Food Insecurity (12/04/2022)   Hunger Vital Sign    Worried About Running Out of Food in the Last Year: Never true    Ran Out of Food in the Last Year: Never true  Transportation Needs: No Transportation Needs (12/04/2022)   PRAPARE - Transportation    Lack of Transportation (Medical): No    Lack of Transportation (Non-Medical): No  Physical Activity: Not on file  Stress: Not on file  Social Connections: Not on file  Intimate Partner Violence: Not At Risk (12/04/2022)   Humiliation, Afraid, Rape, and Kick questionnaire    Fear of Current or Ex-Partner: No    Emotionally Abused: No    Physically Abused: No    Sexually Abused: No    No Known Allergies  Current Facility-Administered Medications  Medication Dose Route Frequency Provider Last Rate Last Admin   acetaminophen (TYLENOL) tablet 650 mg  650 mg Oral Q6H PRN Zierle-Ghosh, Asia B, DO       Or     acetaminophen (TYLENOL) suppository 650 mg  650 mg Rectal Q6H PRN Zierle-Ghosh, Asia B, DO       alteplase (CATHFLO ACTIVASE) injection 2 mg  2 mg Intracatheter Once PRN Singh, Vikas, MD       amLODipine (NORVASC) tablet 10 mg  10 mg Oral Daily Zierle-Ghosh, Asia B, DO   10 mg at 12/05/22 1038   calcium acetate (PHOSLO) capsule 667 mg  667 mg Oral TID WC Singh, Vikas, MD   667 mg at 12/06/22 0927   carvedilol (COREG) tablet 6.25 mg  6.25 mg Oral BID WC Zierle-Ghosh, Asia B, DO   6.25 mg at 12/06/22 0928   ceFAZolin (ANCEF) IVPB 2g/100 mL premix  2 g Intravenous 30 min Pre-Op Jenkins, Mark, MD       Chlorhexidine Gluconate Cloth 2 % PADS 6 each  6 each Topical Q0600 Singh, Vikas, MD   6 each at 12/06/22 0557   Darbepoetin Alfa (ARANESP) injection 60 mcg  60 mcg Subcutaneous Q Mon-1800 Singh, Vikas, MD   60 mcg at 12/05/22 1758   heparin injection 1,000 Units  1,000 Units Intracatheter PRN Singh, Vikas, MD   3,000 Units at 12/05/22 2105   heparin injection  5,000 Units  5,000 Units Subcutaneous Q8H Zierle-Ghosh, Asia B, DO   5,000 Units at 12/05/22 1320   hydrALAZINE (APRESOLINE) tablet 25 mg  25 mg Oral TID Zierle-Ghosh, Asia B, DO   25 mg at 12/06/22 0930   isosorbide mononitrate (IMDUR) 24 hr tablet 15 mg  15 mg Oral Daily Zierle-Ghosh, Asia B, DO   15 mg at 12/06/22 0929   melatonin tablet 6 mg  6 mg Oral QHS Zierle-Ghosh, Asia B, DO   6 mg at 12/05/22 2137   nicotine (NICODERM CQ - dosed in mg/24 hours) patch 14 mg  14 mg Transdermal Daily PRN Johnson, Clanford L, MD       ondansetron (ZOFRAN) tablet 4 mg  4 mg Oral Q6H PRN Zierle-Ghosh, Asia B, DO       Or   ondansetron (ZOFRAN) injection 4 mg  4 mg Intravenous Q6H PRN Zierle-Ghosh, Asia B, DO   4 mg at 12/05/22 0545   oxyCODONE (Oxy IR/ROXICODONE) immediate release tablet 5 mg  5 mg Oral Q4H PRN Zierle-Ghosh, Asia B, DO        REVIEW OF SYSTEMS:  [X] denotes positive finding, [ ] denotes negative finding Cardiac  Comments:  Chest pain or chest pressure:    Shortness of breath upon exertion: x   Short of breath when lying flat:    Irregular heart rhythm:        Vascular    Pain in calf, thigh, or hip brought on by ambulation:    Pain in feet at night that wakes you up from your sleep:     Blood clot in your veins:    Leg swelling:  x       Pulmonary    Oxygen at home:    Productive cough:     Wheezing:         Neurologic    Sudden weakness in arms or legs:     Sudden numbness in arms or legs:     Sudden onset of difficulty speaking or slurred speech:    Temporary loss of vision in one eye:     Problems with dizziness:         Gastrointestinal    Blood in stool:     Vomited blood:           Genitourinary    Burning when urinating:     Blood in urine:        Psychiatric    Major depression:         Hematologic    Bleeding problems:    Problems with blood clotting too easily:        Skin    Rashes or ulcers:        Constitutional    Fever or chills:       PHYSICAL EXAM: Vitals:   12/05/22 2105 12/05/22 2123 12/06/22 0300 12/06/22 0455  BP: (!) 171/99 (!) 173/97 (!) 156/89 (!) 174/96  Pulse: 91 93  98  Resp: 18 18  18  Temp: 97.9 F (36.6 C) 98.3 F (36.8 C)  98.5 F (36.9 C)  TempSrc: Oral Oral  Oral  SpO2: 99% 100%  99%  Weight:    76.6 kg  Height:        GENERAL: The patient is a well-nourished male, in no acute distress. The vital signs are documented above. CARDIOVASCULAR: 2+ radial pulses bilaterally.  Moderate cephalic vein at the antecubital space and also moderate basilic vein at the level of the elbow PULMONARY: There is good air exchange  MUSCULOSKELETAL: There are no major deformities or cyanosis. NEUROLOGIC: No focal weakness or paresthesias are detected. SKIN: There are no ulcers or rashes noted. PSYCHIATRIC: The patient has a normal affect.  DATA:  None  MEDICAL ISSUES: Had long discussion with the patient regarding options for hemodialysis.  I discussed that tunneled hemodialysis catheter which will be placed by interventional radiology.  I explained that this generally gives good use for several months without difficulty.  I did discussion the option of AV fistula versus AV graft.  He is right-handed so we will plan for a left arm access.  I discussed the benefits and limitations of both AV fistula and AV graft.  He does appear to be a candidate for fistula and explained that we would make that final determination at the time of surgery.  We will coordinate a new left arm access as an outpatient in the next 2 to 3 weeks following discharge and establishment of outpatient dialysis.  I explained to him that our office will coordinate this with him.  We will tentatively plan for his surgery date for Tuesday, December 27, 2022 at [facility]   Janiel Crisostomo F. Epifanio Labrador, MD FACS [facility] [place] Office Tel [phone] Pager [phone]  Note: Portions of this report may have been  transcribed using voice recognition software.  Every effort has been made to ensure accuracy; however, inadvertent computerized transcription errors may still be present.   

## 2022-12-06 NOTE — Consult Note (Signed)
Chief Complaint: Acute on Chronic renal failure. Request is for tunneled HD catheter placement  Referring Physician(s): Dr. Brantley Fling  Supervising Physician: Roanna Banning  Patient Status:  [facility] - Inpatient  History of Present Illness: CHIRON UMSTED is a 62 y.o. male inpatient (At [facility]). History of HTN, CHF, CKD. Recently admitted for renal failure and CHF. Presented to the ED at [facility] on 5.26.26 with abdominal pain, constipation and edema to bilateral lower extremities. Found to have acute on chronic renal failure.   Critical care placed a right femoral HD temp cath on 5.26.24. Team is requesting a tunneled HD catheter for ongoing dialysis access.  Patient alert and laying in bed,calm. Wife at bedside. Endorses metallic taste in his mouth. Denies any fevers, headache, chest pain, SOB, cough, abdominal pain, nausea, vomiting or bleeding. Return precautions and treatment recommendations and follow-up discussed with the patient and his wife both who are agreeable with the plan.    Past Medical History:  Diagnosis Date   Cardiomyopathy [facility])    CKD    Cocaine use    ESRD (end stage renal disease) on dialysis (HCC) 12/05/2022   Hypertension    Tobacco abuse     Past Surgical History:  Procedure Laterality Date   BACK SURGERY      Allergies: Patient has no known allergies.  Medications: Prior to Admission medications   Medication Sig Start Date End Date Taking? Authorizing Provider  amLODipine (NORVASC) 10 MG tablet Take 1 tablet (10 mg total) by mouth daily. 11/09/22  Yes Johnson, Clanford L, MD  carvedilol (COREG) 6.25 MG tablet Take 1 tablet (6.25 mg total) by mouth 2 (two) times daily with a meal. 11/08/22  Yes Johnson, Clanford L, MD  chlorhexidine (PERIDEX) 0.12 % solution Use as directed 15 mLs in the mouth or throat 2 (two) times daily.   Yes [provider]  diphenhydramine-acetaminophen (TYLENOL PM) 25-500 MG TABS tablet Take 1 tablet by mouth at bedtime as needed  (sleep, pain).   Yes [provider]  hydrALAZINE (APRESOLINE) 25 MG tablet Take 1 tablet (25 mg total) by mouth 3 (three) times daily. 11/08/22  Yes Johnson, Clanford L, MD  ibuprofen (ADVIL) 800 MG tablet Take 800 mg by mouth every 8 (eight) hours as needed for headache or moderate pain.   Yes [provider]  isosorbide mononitrate (IMDUR) 30 MG 24 hr tablet Take 0.5 tablets (15 mg total) by mouth daily. 11/09/22  Yes Johnson, Clanford L, MD  penicillin v potassium (VEETID) 500 MG tablet Take 500 mg by mouth 4 (four) times daily. 14 day course.   Yes [provider]     Family History  Problem Relation Age of Onset   Heart disease Neg Hx     Social History   Socioeconomic History   Marital status: Married    Spouse name: Not on file   Number of children: Not on file   Years of education: Not on file   Highest education level: Not on file  Occupational History   Not on file  Tobacco Use   Smoking status: Every Day    Packs/day: 1.00    Years: 30.00    Additional pack years: 0.00    Total pack years: 30.00    Types: Cigarettes   Smokeless tobacco: Never  Vaping Use   Vaping Use: Never used  Substance and Sexual Activity   Alcohol use: Not Currently    Comment: quit 1989   Drug use: Yes  Types: Codeine   Sexual activity: Yes    Birth control/protection: None  Other Topics Concern   Not on file  Social History Narrative   Not on file   Social Determinants of Health   Financial Resource Strain: Not on file  Food Insecurity: No Food Insecurity (12/04/2022)   Hunger Vital Sign    Worried About Running Out of Food in the Last Year: Never true    Ran Out of Food in the Last Year: Never true  Transportation Needs: No Transportation Needs (12/04/2022)   PRAPARE - Administrator, Civil Service (Medical): No    Lack of Transportation (Non-Medical): No  Physical Activity: Not on file  Stress: Not on file  Social Connections: Not on file     Review of Systems: A 12 point ROS discussed and pertinent positives are indicated in the HPI above.  All other systems are negative.  Review of Systems  Constitutional:  Negative for fever.  HENT:  Negative for congestion.        Metallic taste  Respiratory:  Negative for cough and shortness of breath.   Cardiovascular:  Negative for chest pain.  Gastrointestinal:  Negative for abdominal pain.  Neurological:  Negative for headaches.  Psychiatric/Behavioral:  Negative for behavioral problems and confusion.     Vital Signs: BP (!) 174/96 (BP Location: Right Arm)   Pulse 98   Temp 98.5 F (36.9 C) (Oral)   Resp 18   Ht 5\' 7"  (1.702 m)   Wt 168 lb 14 oz (76.6 kg)   SpO2 99%   BMI 26.45 kg/m     Physical Exam Vitals and nursing note reviewed.  Constitutional:      Appearance: He is well-developed.  HENT:     Head: Normocephalic.  Cardiovascular:     Rate and Rhythm: Normal rate and regular rhythm.  Pulmonary:     Effort: Pulmonary effort is normal.     Breath sounds: Normal breath sounds.  Musculoskeletal:        General: Normal range of motion.     Cervical back: Normal range of motion.  Skin:    General: Skin is warm and dry.  Neurological:     General: No focal deficit present.     Mental Status: He is alert and oriented to person, place, and time.  Psychiatric:        Mood and Affect: Mood normal.        Behavior: Behavior normal.     Imaging: CT Renal Stone Study  Result Date: 12/04/2022 CLINICAL DATA:  New onset renal failure. EXAM: CT ABDOMEN AND PELVIS WITHOUT CONTRAST TECHNIQUE: Multidetector CT imaging of the abdomen and pelvis was performed following the standard protocol without IV contrast. RADIATION DOSE REDUCTION: This exam was performed according to the departmental dose-optimization program which includes automated exposure control, adjustment of the mA and/or kV according to patient size and/or use of iterative reconstruction technique.  COMPARISON:  None Available. FINDINGS: Lower chest: Enlarged heart. Hepatobiliary: No focal liver abnormality is seen. No gallstones, gallbladder wall thickening, or biliary dilatation. Pancreas: Unremarkable. No pancreatic ductal dilatation or surrounding inflammatory changes. Spleen: Normal in size without focal abnormality. Adrenals/Urinary Tract: Adrenal glands are unremarkable. Kidneys are normal, without renal calculi, focal lesion, or hydronephrosis. Bladder is unremarkable. Stomach/Bowel: Stomach is within normal limits. Appendix appears normal. No evidence of bowel wall thickening, distention, or inflammatory changes. Diffuse colonic diverticulosis without evidence of diverticulitis. Vascular/Lymphatic: No significant vascular findings are present. No  enlarged abdominal or pelvic lymph nodes. Reproductive: Prostate is unremarkable. Other: Diffuse mesenteric stranding, particularly prominent in the upper abdomen. Associated retroperitoneal fat stranding. Musculoskeletal: No acute or significant osseous findings. IMPRESSION: 1. Diffuse mesenteric stranding, particularly prominent in the upper abdomen. Associated retroperitoneal fat stranding. Findings are nonspecific, but may be seen in the setting of mesenteric panniculitis or third spacing. 2. Enlarged heart. 3. Left colonic diverticulosis. Electronically Signed   By: Ted Mcalpine M.D.   On: 12/04/2022 19:32   DG Chest 2 View  Result Date: 12/04/2022 CLINICAL DATA:  Shortness of breath and mid abdominal pain. EXAM: CHEST - 2 VIEW COMPARISON:  October 31, 2022 FINDINGS: The cardiac silhouette is mildly enlarged and unchanged in size. Mild, diffusely increased interstitial lung markings are noted. This is decreased in severity when compared to the prior study. There is no evidence of focal consolidation, pleural effusion or pneumothorax. The visualized skeletal structures are unremarkable. IMPRESSION: Stable cardiomegaly with additional findings that  may represent mild interstitial edema. Electronically Signed   By: Aram Candela M.D.   On: 12/04/2022 18:39    Labs:  CBC: Recent Labs    11/08/22 0430 12/04/22 1800 12/05/22 0508 12/06/22 0438  WBC 7.9 4.1 5.3 4.0  HGB 9.5* 8.0* 7.5* 8.2*  HCT 28.3* 24.1* 22.1* 24.2*  PLT 179 123* 115* 147*    COAGS: No results for input(s): "INR", "APTT" in the last 8760 hours.  BMP: Recent Labs    11/08/22 0430 12/04/22 1800 12/05/22 0010 12/05/22 0508 12/06/22 0438  NA 138 136  --  137 137  K 4.5 6.2* 5.5* 5.5* 4.4  CL 107 107  --  107 103  CO2 22 10*  --  10* 15*  GLUCOSE 87 95  --  85 91  BUN 95* 166*  --  172* 133*  CALCIUM 8.1*  8.2* 7.1*  --  7.1* 7.3*  CREATININE 7.16* 19.37*  --  20.32* 16.86*  GFRNONAA 8* 2*  --  2* 3*    LIVER FUNCTION TESTS: Recent Labs    11/07/22 0432 11/08/22 0430 12/05/22 0508 12/06/22 0438  BILITOT  --   --  0.6  --   AST  --   --  32  --   ALT  --   --  50*  --   ALKPHOS  --   --  48  --   PROT  --   --  5.1*  --   ALBUMIN 2.4* 2.4* 2.4* 2.6*     Assessment and Plan:  62 y.o. male inpatient (At [facility]). History of HTN, CHF, CKD. Recently admitted for renal failure and CHF. Present[facility] to the [facility] at [facility] on 5.26.26 with abdominal pain, constipation and edema to bilateral lower extremities. Found to have acute on chronic renal failure.   Critical care placed a right femoral HD temp cath on 5.26.24. Team is requesting a tunneled HD catheter for ongoing dialysis access.  Per EPIC no additional lines. Chest xray from 5.26.24 shows no lines or drains. BUN 133, Cr 16.86, GFR < 3. Patient is on subcutaneous prophylactic dose of heparin. Last dose  given on 5.27.24. 5.29.24 am dose held. NKDA.   IR consulted for possible tunneled HD catheter placement.  Patient tentatively scheduled for 5.29.24 at [facility].  Team instructed to: Keep Patient to be NPO after midnight Hold prophylactic anticoagulation 24 hours prior to scheduled  procedure. Coordinate transportation to [facility] via [facility] for 9 am arrival.  Risks and benefits discussed with the patient including, but not limited to bleeding, infection, vascular injury, pneumothorax which may require chest tube placement, air embolism or even death  All of the patient's  and his wife's questions were answered, patient  and his wife are agreeable to proceed.  Consent signed and in chart.  Thank you for this interesting consult.  I greatly enjoyed meeting PRAHLAD FREYTES and look forward to participating in their care.  A copy of this report was sent to the requesting provider on this date.  Electronically Signed: Alene Mires, NP 12/06/2022, 11:22 AM   I spent a total of 40 Minutes    in face to face in clinical consultation, greater than 50% of which was counseling/coordinating care for tunneled HD catheter placement

## 2022-12-06 NOTE — Progress Notes (Signed)
   HEMODIALYSIS TREATMENT (HD#2):  Uneventful 2.5 hour heparin-free treatment completed using right femoral non-tunneled catheter.  Dressing changed, no longer bleeding.  Goal met: 2.5 liters removed.  Hypertensive throughout treatment.  All blood was returned.  Post-HD:  12/06/22 1705  Vital Signs  Temp 98.2 F (36.8 C)  Temp Source Oral  Pulse Rate 94  Pulse Rate Source Monitor  Resp 19  BP (!) 172/103  BP Location Right Arm  BP Method Automatic  Patient Position (if appropriate) Lying  Oxygen Therapy  SpO2 99 %  O2 Device Room Air  Pain Assessment  Pain Score 0  Dialysis Weight  Weight 74.1 kg  Type of Weight Post-Dialysis  Post Treatment  Dialyzer Clearance Lightly streaked  Duration of HD Treatment -hour(s) 2.5 hour(s)  Hemodialysis Intake (mL) 0 mL  Liters Processed 37.5  Fluid Removed (mL) 2500 mL  Tolerated HD Treatment Yes  Post-Hemodialysis Comments Goal met   Was transported back to [facility].  Hand-off given to Charlsie Quest, Charity fundraiser.  Arman Filter, RN [facility]

## 2022-12-07 ENCOUNTER — Ambulatory Visit (HOSPITAL_COMMUNITY)
Admit: 2022-12-07 | Discharge: 2022-12-07 | Disposition: A | Discharge status: Home / Self Care | Payer: 59 | Source: Ambulatory Visit | Attending: Radiology | Admitting: Radiology

## 2022-12-07 DIAGNOSIS — Z4901 Encounter for fitting and adjustment of extracorporeal dialysis catheter: Secondary | ICD-10-CM | POA: Diagnosis not present

## 2022-12-07 DIAGNOSIS — N186 End stage renal disease: Secondary | ICD-10-CM | POA: Insufficient documentation

## 2022-12-07 DIAGNOSIS — N179 Acute kidney failure, unspecified: Secondary | ICD-10-CM | POA: Diagnosis not present

## 2022-12-07 DIAGNOSIS — I120 Hypertensive chronic kidney disease with stage 5 chronic kidney disease or end stage renal disease: Secondary | ICD-10-CM | POA: Insufficient documentation

## 2022-12-07 DIAGNOSIS — F17210 Nicotine dependence, cigarettes, uncomplicated: Secondary | ICD-10-CM | POA: Insufficient documentation

## 2022-12-07 HISTORY — PX: IR FLUORO GUIDE CV LINE RIGHT: IMG2283

## 2022-12-07 HISTORY — PX: IR US GUIDE VASC ACCESS RIGHT: IMG2390

## 2022-12-07 LAB — RENAL FUNCTION PANEL
Albumin: 2.4 g/dL — ABNORMAL LOW (ref 3.5–5.0)
Anion gap: 18 — ABNORMAL HIGH (ref 5–15)
BUN: 106 mg/dL — ABNORMAL HIGH (ref 6–20)
CO2: 18 mmol/L — ABNORMAL LOW (ref 22–32)
Calcium: 7.1 mg/dL — ABNORMAL LOW (ref 8.9–10.3)
Chloride: 100 mmol/L (ref 98–111)
Creatinine, Ser: 14.69 mg/dL — ABNORMAL HIGH (ref 0.61–1.24)
GFR, Estimated: 3 mL/min — ABNORMAL LOW (ref >60)
Glucose, Bld: 83 mg/dL (ref 70–99)
Phosphorus: 7.3 mg/dL — ABNORMAL HIGH (ref 2.5–4.6)
Potassium: 4.1 mmol/L (ref 3.5–5.1)
Sodium: 136 mmol/L (ref 135–145)

## 2022-12-07 LAB — CBC
HCT: 23.6 % — ABNORMAL LOW (ref 39.0–52.0)
Hemoglobin: 8.1 g/dL — ABNORMAL LOW (ref 13.0–17.0)
MCH: 30.7 pg (ref 26.0–34.0)
MCHC: 34.3 g/dL (ref 30.0–36.0)
MCV: 89.4 fL (ref 80.0–100.0)
Platelets: 134 10*3/uL — ABNORMAL LOW (ref 150–400)
RBC: 2.64 MIL/uL — ABNORMAL LOW (ref 4.22–5.81)
RDW: 14.1 % (ref 11.5–15.5)
WBC: 4.7 10*3/uL (ref 4.0–10.5)
nRBC: 0 % (ref 0.0–0.2)

## 2022-12-07 LAB — PARATHYROID HORMONE, INTACT (NO CA): PTH: 166 pg/mL — ABNORMAL HIGH (ref 15–65)

## 2022-12-07 LAB — PROTIME-INR
INR: 0.9 (ref 0.8–1.2)
Prothrombin Time: 12.6 seconds (ref 11.4–15.2)

## 2022-12-07 LAB — HEPATITIS B SURFACE ANTIBODY, QUANTITATIVE: Hep B S AB Quant (Post): <3.5 m[IU]/mL — ABNORMAL LOW (ref >9.9)

## 2022-12-07 MED ORDER — ONDANSETRON HCL 4 MG/2ML IJ SOLN
INTRAMUSCULAR | Status: AC
  Filled 2022-12-07: qty 2

## 2022-12-07 MED ORDER — HEPARIN SODIUM (PORCINE) 1000 UNIT/ML IJ SOLN
INTRAMUSCULAR | Status: AC
  Filled 2022-12-07: qty 10

## 2022-12-07 MED ORDER — MIDAZOLAM HCL 2 MG/2ML IJ SOLN
INTRAMUSCULAR | Status: AC | PRN
  Administered 2022-12-07: 1 mg via INTRAVENOUS

## 2022-12-07 MED ORDER — MIDAZOLAM HCL 2 MG/2ML IJ SOLN
INTRAMUSCULAR | Status: AC
  Filled 2022-12-07: qty 2

## 2022-12-07 MED ORDER — FENTANYL CITRATE (PF) 100 MCG/2ML IJ SOLN
INTRAMUSCULAR | Status: AC | PRN
  Administered 2022-12-07: 50 ug via INTRAVENOUS

## 2022-12-07 MED ORDER — CHLORHEXIDINE GLUCONATE CLOTH 2 % EX PADS
6.0000 | MEDICATED_PAD | Freq: Every day | CUTANEOUS | Status: DC
  Administered 2022-12-07: 6 via TOPICAL

## 2022-12-07 MED ORDER — FENTANYL CITRATE (PF) 100 MCG/2ML IJ SOLN
INTRAMUSCULAR | Status: AC
  Filled 2022-12-07: qty 2

## 2022-12-07 MED ORDER — HEPARIN SODIUM (PORCINE) 1000 UNIT/ML DIALYSIS: 1000.0000 [IU] | INTRAMUSCULAR | Status: DC | PRN

## 2022-12-07 MED ORDER — LIDOCAINE-EPINEPHRINE 1 %-1:100000 IJ SOLN
INTRAMUSCULAR | Status: AC
  Filled 2022-12-07: qty 1

## 2022-12-07 MED ORDER — CEFAZOLIN SODIUM-DEXTROSE 2-4 GM/100ML-% IV SOLN
INTRAVENOUS | Status: AC
  Filled 2022-12-07: qty 100

## 2022-12-07 MED ORDER — ALTEPLASE 2 MG IJ SOLR: 2.0000 mg | Freq: Once | INTRAMUSCULAR | Status: DC | PRN

## 2022-12-07 MED ORDER — CALCIUM ACETATE (PHOS BINDER) 667 MG PO CAPS: 667.0000 mg | ORAL_CAPSULE | Freq: Three times a day (TID) | ORAL | 0 refills | Status: AC

## 2022-12-07 MED ORDER — HEPARIN SODIUM (PORCINE) 1000 UNIT/ML IJ SOLN
INTRAMUSCULAR | Status: AC
  Filled 2022-12-07: qty 4

## 2022-12-07 NOTE — Procedures (Signed)
Pre-procedure Diagnosis: ESRD Post-procedure Diagnosis: Same  Successful placement of tunneled HD catheter with tips terminating within the superior aspect of the right atrium.    Complications: None Immediate EBL: Trace  The catheter is ready for immediate use.   Jay Jastin Fore, MD Pager #: [phone]   

## 2022-12-07 NOTE — Progress Notes (Signed)
   HEMODIALYSIS TREATMENT NOTE (HD#3):   3 hour HD session completed using newly inserted RIJ TDC.  Cath tolerated prescribed flow with stable pressures. Goal met: 2.5 liters removed without interruption in UF.  Remains hypertensive with DBPs in low 100s despite serial UF.   All blood was returned.  Right femoral dressing is without drainage.  As pt will now be receiving HD with [facility], he elects to f/u with Dr. Celso Amy, who saw pt today during dialysis.  Post-dialysis:  12/07/22 1845  Vital Signs  Temp 98 F (36.7 C)  Temp Source Oral  Pulse Rate (!) 115  Pulse Rate Source Monitor  Resp 18  BP (!) 160/114  BP Location Right Arm  BP Method Automatic  Patient Position (if appropriate) Standing  Oxygen Therapy  SpO2 99 %  O2 Device Room Air  Dialysis Weight  Weight 68.7 kg  Type of Weight Post-Dialysis  Post Treatment  Dialyzer Clearance Lightly streaked  Duration of HD Treatment -hour(s) 3.5 hour(s)  Hemodialysis Intake (mL) 0 mL  Liters Processed 54  Fluid Removed (mL) 2500 mL  Tolerated HD Treatment Yes  Post-Hemodialysis Comments Goal met  Education / Care Plan  Dialysis Education Provided Yes  Documented Education in Care Plan Yes  Outpatient Plan of Care Reviewed and on Chart Yes    Arman Filter, RN [facility]

## 2022-12-07 NOTE — Progress Notes (Signed)
Right femoral catheter removed without difficulty.  Pressured held.  Tolerated well.  Will sign off.

## 2022-12-07 NOTE — TOC Transition Note (Signed)
Transition of Care [facility]) - CM/SW Discharge Note   Patient Details  Name: Damon Williams MRN: [id number] Date of Birth: 18-Aug-1960  Transition of Care [facility]) CM/SW Contact:  Leitha Bleak, RN Phone Number: 12/07/2022, 3:54 PM   Clinical Narrative:   patient ready for discharge. [facility] in  accepted.  Dialysis RN confirmed with Tamela Oddi that they are ready for patient tomorrow. Appointment time is 11AM, patient is to arrive at 10:45 to sign papers. Patient understand and has transportation. Add information to AVS.    Final next level of care: Home/Self Care Barriers to Discharge: Barriers Resolved   Patient Goals and CMS Choice CMS Medicare.gov Compare Post Acute Care list provided to:: Patient Choice offered to / list presented to : Patient  Discharge Placement        Patient and family notified of of transfer: 12/07/22  Discharge Plan and Services Additional resources added to the After Visit Summary for   In-house Referral: Clinical Social Work   Post Acute Care Choice: Dialysis              Social Determinants of Health (SDOH) Interventions SDOH Screenings   Food Insecurity: No Food Insecurity (12/04/2022)  Housing: Low Risk  (12/04/2022)  Transportation Needs: No Transportation Needs (12/04/2022)  Utilities: Not At Risk (12/04/2022)  Tobacco Use: High Risk (12/07/2022)     Readmission Risk Interventions    12/05/2022   10:54 AM  Readmission Risk Prevention Plan  Transportation Screening Complete  Home Care Screening Complete  Medication Review (RN CM) Complete

## 2022-12-07 NOTE — Plan of Care (Signed)
  Problem: Clinical Measurements: Goal: Ability to maintain clinical measurements within normal limits will improve Outcome: Adequate for Discharge   Problem: Elimination: Goal: Will not experience complications related to urinary retention Outcome: Adequate for Discharge   Problem: Education: Goal: Knowledge of General Education information will improve Description: Including pain rating scale, medication(s)/side effects and non-pharmacologic comfort measures Outcome: Completed/Met   Problem: Health Behavior/Discharge Planning: Goal: Ability to manage health-related needs will improve Outcome: Completed/Met   Problem: Clinical Measurements: Goal: Will remain free from infection Outcome: Completed/Met Goal: Diagnostic test results will improve Outcome: Completed/Met Goal: Respiratory complications will improve Outcome: Completed/Met Goal: Cardiovascular complication will be avoided Outcome: Completed/Met   Problem: Activity: Goal: Risk for activity intolerance will decrease Outcome: Completed/Met   Problem: Nutrition: Goal: Adequate nutrition will be maintained Outcome: Completed/Met   Problem: Coping: Goal: Level of anxiety will decrease Outcome: Completed/Met   Problem: Elimination: Goal: Will not experience complications related to bowel motility Outcome: Completed/Met   Problem: Pain Managment: Goal: General experience of comfort will improve Outcome: Completed/Met   Problem: Safety: Goal: Ability to remain free from injury will improve Outcome: Completed/Met   Problem: Skin Integrity: Goal: Risk for impaired skin integrity will decrease Outcome: Completed/Met

## 2022-12-07 NOTE — Discharge Summary (Signed)
Physician Discharge Summary  Damon Williams:[id number] DOB: June 03, 1961 DOA: 12/04/2022  PCP: Patient, No Pcp Per  Admit date: 12/04/2022  Discharge date: 12/07/2022  Admitted From:Home  Disposition:  Home  Recommendations for Outpatient Follow-up:  Follow up with PCP in 1-2 weeks Please follow-up with nephrology and continue dialysis with [facility] MWF with next session scheduled for 5/30 at 11 AM Follow-up with vascular surgery outpatient as arranged on June 18 for AV fistula versus graft placement to left upper extremity Continue medications as prescribed below  Home Health: None  Equipment/Devices: None  Discharge Condition:Stable  CODE STATUS: Full  Diet recommendation: Heart Healthy/renal  Brief/Interim Summary: 62 y.o. male with medical history significant of uncontrolled high blood pressure, history of substance use, but no longer per his report, and more presents to the ED with a chief complaint of shortness of breath.  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  Tunneled hemodialysis catheter has been placed 5/29.  He is tolerating dialysis well and he is in stable condition for discharge.  No other acute events or concerns noted.  He has a chair time arranged through [facility].  Discharge Diagnoses:  Principal Problem:   AKI (acute kidney injury) (HCC) Active Problems:   Uncontrolled hypertension   Hyperkalemia   Metabolic acidosis   Uremia   ESRD on hemodialysis (HCC)  Principal discharge diagnosis: AKI on CKD stage IV now ESRD with new hemodialysis initiation.  Discharge Instructions  Discharge Instructions     Diet - low sodium heart healthy   Complete by: As directed    Increase activity slowly   Complete by: As directed       Allergies as of 12/07/2022   No Known Allergies      Medication List     STOP taking these medications    ibuprofen 800 MG tablet Commonly  known as: ADVIL   penicillin v potassium 500 MG tablet Commonly known as: VEETID       TAKE these medications    amLODipine 10 MG tablet Commonly known as: NORVASC Take 1 tablet (10 mg total) by mouth daily.   calcium acetate 667 MG capsule Commonly known as: PHOSLO Take 1 capsule (667 mg total) by mouth 3 (three) times daily with meals.   carvedilol 6.25 MG tablet Commonly known as: COREG Take 1 tablet (6.25 mg total) by mouth 2 (two) times daily with a meal.   chlorhexidine 0.12 % solution Commonly known as: PERIDEX Use as directed 15 mLs in the mouth or throat 2 (two) times daily.   diphenhydramine-acetaminophen 25-500 MG Tabs tablet Commonly known as: TYLENOL PM Take 1 tablet by mouth at bedtime as needed (sleep, pain).   hydrALAZINE 25 MG tablet Commonly known as: APRESOLINE Take 1 tablet (25 mg total) by mouth 3 (three) times daily.   isosorbide mononitrate 30 MG 24 hr tablet Commonly known as: IMDUR Take 0.5 tablets (15 mg total) by mouth daily.        No Known Allergies  Consultations: Nephrology Vascular surgery IR General surgery  Procedures/Studies: IR Fluoro Guide CV Line Right  Result Date: 12/07/2022 INDICATION: End-stage renal disease. In need of durable intravenous access for initiation of hemodialysis. EXAM: TUNNELED CENTRAL VENOUS HEMODIALYSIS CATHETER PLACEMENT WITH ULTRASOUND AND FLUOROSCOPIC GUIDANCE MEDICATIONS: Patient is currently admitted to the hospital receiving intravenous antibiotics. The antibiotic was given in an appropriate time interval prior to skin puncture. ANESTHESIA/SEDATION: Moderate (  conscious) sedation was employed during this procedure. A total of Versed 1 mg and Fentanyl 50 mcg was administered intravenously. Moderate Sedation Time: 14 minutes. The patient's level of consciousness and vital signs were monitored continuously by radiology nursing throughout the procedure under my direct supervision. FLUOROSCOPY TIME:  18  seconds (2 mGy) COMPLICATIONS: None immediate. PROCEDURE: Informed written consent was obtained from the patient after a discussion of the risks, benefits, and alternatives to treatment. Questions regarding the procedure were encouraged and answered. The right neck and chest were prepped with chlorhexidine in a sterile fashion, and a sterile drape was applied covering the operative field. Maximum barrier sterile technique with sterile gowns and gloves were used for the procedure. A timeout was performed prior to the initiation of the procedure. After creating a small venotomy incision, a micropuncture kit was utilized to access the internal jugular vein. Real-time ultrasound guidance was utilized for vascular access including the acquisition of a permanent ultrasound image documenting patency of the accessed vessel. The microwire was utilized to measure appropriate catheter length. A stiff Glidewire was advanced to the level of the IVC and the micropuncture sheath was exchanged for a peel-away sheath. A palindrome tunneled hemodialysis catheter measuring 19 cm from tip to cuff was tunneled in a retrograde fashion from the anterior chest wall to the venotomy incision. The catheter was then placed through the peel-away sheath with tips ultimately positioned within the superior aspect of the right atrium. Final catheter positioning was confirmed and documented with a spot radiographic image. The catheter aspirates and flushes normally. The catheter was flushed with appropriate volume heparin dwells. The catheter exit site was secured with a 0-Prolene retention suture. The venotomy incision was closed with an interrupted 4-0 Vicryl, Dermabond and Steri-strips. Dressings were applied. The patient tolerated the procedure well without immediate post procedural complication. IMPRESSION: Successful placement of 19 cm tip to cuff tunneled hemodialysis catheter via the right internal jugular vein with tips terminating within  the superior aspect of the right atrium. The catheter is ready for immediate use. Electronically Signed   By: Simonne Come M.D.   On: 12/07/2022 11:02   IR US Guide Vasc Access Right  Result Date: 12/07/2022 INDICATION: End-stage renal disease. In need of durable intravenous access for initiation of hemodialysis. EXAM: TUNNELED CENTRAL VENOUS HEMODIALYSIS CATHETER PLACEMENT WITH ULTRASOUND AND FLUOROSCOPIC GUIDANCE MEDICATIONS: Patient is currently admitted to the hospital receiving intravenous antibiotics. The antibiotic was given in an appropriate time interval prior to skin puncture. ANESTHESIA/SEDATION: Moderate (conscious) sedation was employed during this procedure. A total of Versed 1 mg and Fentanyl 50 mcg was administered intravenously. Moderate Sedation Time: 14 minutes. The patient's level of consciousness and vital signs were monitored continuously by radiology nursing throughout the procedure under my direct supervision. FLUOROSCOPY TIME:  18 seconds (2 mGy) COMPLICATIONS: None immediate. PROCEDURE: Informed written consent was obtained from the patient after a discussion of the risks, benefits, and alternatives to treatment. Questions regarding the procedure were encouraged and answered. The right neck and chest were prepped with chlorhexidine in a sterile fashion, and a sterile drape was applied covering the operative field. Maximum barrier sterile technique with sterile gowns and gloves were used for the procedure. A timeout was performed prior to the initiation of the procedure. After creating a small venotomy incision, a micropuncture kit was utilized to access the internal jugular vein. Real-time ultrasound guidance was utilized for vascular access including the acquisition of a permanent ultrasound image documenting patency of the accessed  vessel. The microwire was utilized to measure appropriate catheter length. A stiff Glidewire was advanced to the level of the IVC and the micropuncture  sheath was exchanged for a peel-away sheath. A palindrome tunneled hemodialysis catheter measuring 19 cm from tip to cuff was tunneled in a retrograde fashion from the anterior chest wall to the venotomy incision. The catheter was then placed through the peel-away sheath with tips ultimately positioned within the superior aspect of the right atrium. Final catheter positioning was confirmed and documented with a spot radiographic image. The catheter aspirates and flushes normally. The catheter was flushed with appropriate volume heparin dwells. The catheter exit site was secured with a 0-Prolene retention suture. The venotomy incision was closed with an interrupted 4-0 Vicryl, Dermabond and Steri-strips. Dressings were applied. The patient tolerated the procedure well without immediate post procedural complication. IMPRESSION: Successful placement of 19 cm tip to cuff tunneled hemodialysis catheter via the right internal jugular vein with tips terminating within the superior aspect of the right atrium. The catheter is ready for immediate use. Electronically Signed   By: Simonne Come M.D.   On: 12/07/2022 11:02   CT Renal Stone Study  Result Date: 12/04/2022 CLINICAL DATA:  New onset renal failure. EXAM: CT ABDOMEN AND PELVIS WITHOUT CONTRAST TECHNIQUE: Multidetector CT imaging of the abdomen and pelvis was performed following the standard protocol without IV contrast. RADIATION DOSE REDUCTION: This exam was performed according to the departmental dose-optimization program which includes automated exposure control, adjustment of the mA and/or kV according to patient size and/or use of iterative reconstruction technique. COMPARISON:  None Available. FINDINGS: Lower chest: Enlarged heart. Hepatobiliary: No focal liver abnormality is seen. No gallstones, gallbladder wall thickening, or biliary dilatation. Pancreas: Unremarkable. No pancreatic ductal dilatation or surrounding inflammatory changes. Spleen: Normal in  size without focal abnormality. Adrenals/Urinary Tract: Adrenal glands are unremarkable. Kidneys are normal, without renal calculi, focal lesion, or hydronephrosis. Bladder is unremarkable. Stomach/Bowel: Stomach is within normal limits. Appendix appears normal. No evidence of bowel wall thickening, distention, or inflammatory changes. Diffuse colonic diverticulosis without evidence of diverticulitis. Vascular/Lymphatic: No significant vascular findings are present. No enlarged abdominal or pelvic lymph nodes. Reproductive: Prostate is unremarkable. Other: Diffuse mesenteric stranding, particularly prominent in the upper abdomen. Associated retroperitoneal fat stranding. Musculoskeletal: No acute or significant osseous findings. IMPRESSION: 1. Diffuse mesenteric stranding, particularly prominent in the upper abdomen. Associated retroperitoneal fat stranding. Findings are nonspecific, but may be seen in the setting of mesenteric panniculitis or third spacing. 2. Enlarged heart. 3. Left colonic diverticulosis. Electronically Signed   By: Ted Mcalpine M.D.   On: 12/04/2022 19:32   DG Chest 2 View  Result Date: 12/04/2022 CLINICAL DATA:  Shortness of breath and mid abdominal pain. EXAM: CHEST - 2 VIEW COMPARISON:  October 31, 2022 FINDINGS: The cardiac silhouette is mildly enlarged and unchanged in size. Mild, diffusely increased interstitial lung markings are noted. This is decreased in severity when compared to the prior study. There is no evidence of focal consolidation, pleural effusion or pneumothorax. The visualized skeletal structures are unremarkable. IMPRESSION: Stable cardiomegaly with additional findings that may represent mild interstitial edema. Electronically Signed   By: Aram Candela M.D.   On: 12/04/2022 18:39     Discharge Exam: Vitals:   12/07/22 0754 12/07/22 1237  BP: (!) 156/88 (!) 169/107  Pulse: (!) 102 83  Resp: 20   Temp: 98.7 F (37.1 C) 97.7 F (36.5 C)  SpO2: 98% 99%    Vitals:  12/06/22 2100 12/07/22 0516 12/07/22 0754 12/07/22 1237  BP: (!) 163/97 (!) 156/91 (!) 156/88 (!) 169/107  Pulse: 97 (!) 101 (!) 102 83  Resp: 20 18 20    Temp: 98.7 F (37.1 C) 98.3 F (36.8 C) 98.7 F (37.1 C) 97.7 F (36.5 C)  TempSrc: Oral  Oral Oral  SpO2: 98% 97% 98% 99%  Weight:  69.4 kg    Height:        General: Pt is alert, awake, not in acute distress Cardiovascular: RRR, S1/S2 +, no rubs, no gallops Respiratory: CTA bilaterally, no wheezing, no rhonchi Abdominal: Soft, NT, ND, bowel sounds + Extremities: no edema, no cyanosis    The results of significant diagnostics from this hospitalization (including imaging, microbiology, ancillary and laboratory) are listed below for reference.     Microbiology: No results found for this or any previous visit (from the past 240 hour(s)).   Labs: BNP (last 3 results) Recent Labs    11/04/22 1112 11/05/22 0356 12/04/22 1800  BNP 677.0* 564.0* 1,433.0*   Basic Metabolic Panel: Recent Labs  Lab 12/04/22 1800 12/05/22 0010 12/05/22 0508 12/06/22 0438 12/07/22 0419  NA 136  --  137 137 136  K 6.2* 5.5* 5.5* 4.4 4.1  CL 107  --  107 103 100  CO2 10*  --  10* 15* 18*  GLUCOSE 95  --  85 91 83  BUN 166*  --  172* 133* 106*  CREATININE 19.37*  --  20.32* 16.86* 14.69*  CALCIUM 7.1*  --  7.1* 7.3* 7.1*  MG  --   --  2.3 2.0  --   PHOS  --   --  10.0* 8.2* 7.3*   Liver Function Tests: Recent Labs  Lab 12/05/22 0508 12/06/22 0438 12/07/22 0419  AST 32  --   --   ALT 50*  --   --   ALKPHOS 48  --   --   BILITOT 0.6  --   --   PROT 5.1*  --   --   ALBUMIN 2.4* 2.6* 2.4*   No results for input(s): "LIPASE", "AMYLASE" in the last 168 hours. No results for input(s): "AMMONIA" in the last 168 hours. CBC: Recent Labs  Lab 12/04/22 1800 12/05/22 0508 12/06/22 0438 12/07/22 0419  WBC 4.1 5.3 4.0 4.7  NEUTROABS  --  3.0  --   --   HGB 8.0* 7.5* 8.2* 8.1*  HCT 24.1* 22.1* 24.2* 23.6*  MCV 92.7  93.6 91.7 89.4  PLT 123* 115* 147* 134*   Cardiac Enzymes: No results for input(s): "CKTOTAL", "CKMB", "CKMBINDEX", "TROPONINI" in the last 168 hours. BNP: Invalid input(s): "POCBNP" CBG: No results for input(s): "GLUCAP" in the last 168 hours. D-Dimer No results for input(s): "DDIMER" in the last 72 hours. Hgb A1c No results for input(s): "HGBA1C" in the last 72 hours. Lipid Profile No results for input(s): "CHOL", "HDL", "LDLCALC", "TRIG", "CHOLHDL", "LDLDIRECT" in the last 72 hours. Thyroid function studies No results for input(s): "TSH", "T4TOTAL", "T3FREE", "THYROIDAB" in the last 72 hours.  Invalid input(s): "FREET3" Anemia work up Recent Labs    12/04/22 1800 12/04/22 1924  VITAMINB12 354  --   FOLATE 9.6  --   FERRITIN 816*  --   TIBC 249*  --   IRON 91  --   RETICCTPCT  --  1.2   Urinalysis    Component Value Date/Time   COLORURINE STRAW (A) 11/04/2022 1226   APPEARANCEUR CLEAR 11/04/2022 1226   LABSPEC  1.011 11/04/2022 1226   PHURINE 5.0 11/04/2022 1226   GLUCOSEU NEGATIVE 11/04/2022 1226   HGBUR SMALL (A) 11/04/2022 1226   BILIRUBINUR NEGATIVE 11/04/2022 1226   KETONESUR NEGATIVE 11/04/2022 1226   PROTEINUR 100 (A) 11/04/2022 1226   NITRITE NEGATIVE 11/04/2022 1226   LEUKOCYTESUR NEGATIVE 11/04/2022 1226   Sepsis Labs Recent Labs  Lab 12/04/22 1800 12/05/22 0508 12/06/22 0438 12/07/22 0419  WBC 4.1 5.3 4.0 4.7   Microbiology No results found for this or any previous visit (from the past 240 hour(s)).   Time coordinating discharge: 35 minutes  SIGNED:   Erick Blinks, DO [facility] 12/07/2022, 3:42 PM  If 7PM-7AM, please contact night-coverage [email or web address]

## 2022-12-07 NOTE — Progress Notes (Signed)
Went over discharge instructions with patient. Patient aware to f/u with [facility] in [place] 12/08/2022 at 10:45 AM.

## 2022-12-07 NOTE — Progress Notes (Signed)
Pharmacist Heart Failure Core Measure Documentation  Assessment: Damon Williams has an EF documented as 30% on 11/04/22 by echo.  Rationale: Heart failure patients with left ventricular systolic dysfunction (LVSD) and an EF < 40% should be prescribed an angiotensin converting enzyme inhibitor (ACEI) or angiotensin receptor blocker (ARB) at discharge unless a contraindication is documented in the medical record.  This patient is not currently on an ACEI or ARB for HF.  This note is being placed in the record in order to provide documentation that a contraindication to the use of these agents is present for this encounter.  ACE Inhibitor or Angiotensin Receptor Blocker is contraindicated (specify all that apply)  []   ACEI allergy AND ARB allergy []   Angioedema []   Moderate or severe aortic stenosis []   Hyperkalemia []   Hypotension []   Renal artery stenosis [x]   Worsening renal function, preexisting renal disease or dysfunction  [name] PharmD., BCPS Clinical Pharmacist 12/07/2022 11:34 AM

## 2022-12-07 NOTE — Progress Notes (Signed)
PROGRESS NOTE    Damon Williams  ZOX:[id number] DOB: [birth year] DOA: 12/04/2022 PCP: Patient, No Pcp Per   Brief Narrative:    62 y.o. male with medical history significant of uncontrolled high blood pressure, history of substance use, but no longer per his report, and more presents to the ED with a chief complaint of shortness of breath.  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  Tunneled hemodialysis catheter has been placed 5/29.  Assessment & Plan:   Principal Problem:   AKI (acute kidney injury) (HCC) Active Problems:   Uncontrolled hypertension   Hyperkalemia   Metabolic acidosis   Uremia   ESRD on hemodialysis (HCC)  Assessment and Plan:  AKI on CKD stage IV --- now ESRD  -Cr steadily trending upward since late April -Cr now 19.37 -Pt is now uremic and nephrology has advised starting HD -Dr. Lovell Sheehan placed temporary HD catheter in right femoral on 4/30 -HD to begin slow start on 5/27 and 5/28 per Dr. Romelle Starcher -TOC consulted for CLIP -VVS consult prior to discharge home - seen by Dr. Arbie Cookey and scheduled for AV fistula/graft surgery 12/27/22. -pt wants to discuss renal transplant with his nephrology team -Tunneled dialysis catheter placed 5/29 per IR   Metabolic acidosis with Anion Gap  -secondary to ESRD  -Pt will be starting hemodialysis on 5/27    Uncontrolled hypertension -continue norvasc, coreg, hydralazine -UF as tolerated   Acute HFrEF / Cardiomyopathy  - He will begin hemodialysis slow start on 5/27 and 5/28 for volume removal - continue cardiac medication - he will miss his outpatient follow up with cardiology scheduled for 5/28 and will reschedule   Anemia in CKD  - follow daily CBC - transfuse for hg<7 - nephrology team starting arenesp 60 mcg qMon starting 5/27   H/o recreational drug use  - pt reports he only used cocaine intermittently and has stopped  completely   Tobacco use - strongly advised against ongoing tobacco use - will offer nicotine patch for cravings    DVT prophylaxis:Heparin Code Status: Full Family Communication: None at bedside Disposition Plan:  Status is: Inpatient Remains inpatient appropriate because: Need for IV medications and inpatient procedure  Consultants:  Nephrology IR General Surgery Vascular surgery  Procedures:  Temporary dialysis catheter placement right femoral 5/27 Dr. Delilah Shan hemodialysis catheter placement to right subclavian 5/29 IR  Antimicrobials:  Anti-infectives (From admission, onward)    Start     Dose/Rate Route Frequency Ordered Stop   12/27/22 0800  ceFAZolin (ANCEF) IVPB 2g/100 mL premix        2 g 200 mL/hr over 30 Minutes Intravenous  Once 12/06/22 1130     12/05/22 1000  ceFAZolin (ANCEF) powder 1 g  Status:  Discontinued        1 g Other To Surgery 12/05/22 0900 12/05/22 0916   12/05/22 0916  ceFAZolin (ANCEF) IVPB 2g/100 mL premix        2 g 200 mL/hr over 30 Minutes Intravenous 30 min pre-op 12/05/22 0916         Subjective: Patient seen and evaluated today with no new acute complaints or concerns. No acute concerns or events noted overnight.  Objective: Vitals:   12/06/22 2100 12/07/22 0516 12/07/22 0754 12/07/22 1237  BP: (!) 163/97 (!) 156/91 (!) 156/88 (!) 169/107  Pulse: 97 (!) 101 (!) 102 83  Resp: 20 18 20  Temp: 98.7 F (37.1 C) 98.3 F (36.8 C) 98.7 F (37.1 C) 97.7 F (36.5 C)  TempSrc: Oral  Oral Oral  SpO2: 98% 97% 98% 99%  Weight:  69.4 kg    Height:        Intake/Output Summary (Last 24 hours) at 12/07/2022 1300 Last data filed at 12/06/2022 1705 Gross per 24 hour  Intake 0 ml  Output 2500 ml  Net -2500 ml   Filed Weights   12/06/22 1400 12/06/22 1705 12/07/22 0516  Weight: 76.8 kg 74.1 kg 69.4 kg    Examination:  General exam: Appears calm and comfortable  Respiratory system: Clear to auscultation. Respiratory  effort normal. Cardiovascular system: S1 & S2 heard, RRR.  Right subclavian catheter present Gastrointestinal system: Abdomen is soft Central nervous system: Alert and awake Extremities: No edema, right femoral catheter present Skin: No significant lesions noted Psychiatry: Flat affect.    Data Reviewed: I have personally reviewed following labs and imaging studies  CBC: Recent Labs  Lab 12/04/22 1800 12/05/22 0508 12/06/22 0438 12/07/22 0419  WBC 4.1 5.3 4.0 4.7  NEUTROABS  --  3.0  --   --   HGB 8.0* 7.5* 8.2* 8.1*  HCT 24.1* 22.1* 24.2* 23.6*  MCV 92.7 93.6 91.7 89.4  PLT 123* 115* 147* 134*   Basic Metabolic Panel: Recent Labs  Lab 12/04/22 1800 12/05/22 0010 12/05/22 0508 12/06/22 0438 12/07/22 0419  NA 136  --  137 137 136  K 6.2* 5.5* 5.5* 4.4 4.1  CL 107  --  107 103 100  CO2 10*  --  10* 15* 18*  GLUCOSE 95  --  85 91 83  BUN 166*  --  172* 133* 106*  CREATININE 19.37*  --  20.32* 16.86* 14.69*  CALCIUM 7.1*  --  7.1* 7.3* 7.1*  MG  --   --  2.3 2.0  --   PHOS  --   --  10.0* 8.2* 7.3*   GFR: Estimated Creatinine Clearance: 5.1 mL/min (A) (by C-G formula based on SCr of 14.69 mg/dL (H)). Liver Function Tests: Recent Labs  Lab 12/05/22 0508 12/06/22 0438 12/07/22 0419  AST 32  --   --   ALT 50*  --   --   ALKPHOS 48  --   --   BILITOT 0.6  --   --   PROT 5.1*  --   --   ALBUMIN 2.4* 2.6* 2.4*   No results for input(s): "LIPASE", "AMYLASE" in the last 168 hours. No results for input(s): "AMMONIA" in the last 168 hours. Coagulation Profile: No results for input(s): "INR", "PROTIME" in the last 168 hours. Cardiac Enzymes: No results for input(s): "CKTOTAL", "CKMB", "CKMBINDEX", "TROPONINI" in the last 168 hours. BNP (last 3 results) No results for input(s): "PROBNP" in the last 8760 hours. HbA1C: No results for input(s): "HGBA1C" in the last 72 hours. CBG: No results for input(s): "GLUCAP" in the last 168 hours. Lipid Profile: No results  for input(s): "CHOL", "HDL", "LDLCALC", "TRIG", "CHOLHDL", "LDLDIRECT" in the last 72 hours. Thyroid Function Tests: No results for input(s): "TSH", "T4TOTAL", "FREET4", "T3FREE", "THYROIDAB" in the last 72 hours. Anemia Panel: Recent Labs    12/04/22 1800 12/04/22 1924  VITAMINB12 354  --   FOLATE 9.6  --   FERRITIN 816*  --   TIBC 249*  --   IRON 91  --   RETICCTPCT  --  1.2   Sepsis Labs: No results for input(s): "PROCALCITON", "LATICACIDVEN" in the  last 168 hours.  No results found for this or any previous visit (from the past 240 hour(s)).       Radiology Studies: IR Fluoro Guide CV Line Right  Result Date: 12/07/2022 INDICATION: End-stage renal disease. In need of durable intravenous access for initiation of hemodialysis. EXAM: TUNNELED CENTRAL VENOUS HEMODIALYSIS CATHETER PLACEMENT WITH ULTRASOUND AND FLUOROSCOPIC GUIDANCE MEDICATIONS: Patient is currently admitted to the hospital receiving intravenous antibiotics. The antibiotic was given in an appropriate time interval prior to skin puncture. ANESTHESIA/SEDATION: Moderate (conscious) sedation was employed during this procedure. A total of Versed 1 mg and Fentanyl 50 mcg was administered intravenously. Moderate Sedation Time: 14 minutes. The patient's level of consciousness and vital signs were monitored continuously by radiology nursing throughout the procedure under my direct supervision. FLUOROSCOPY TIME:  18 seconds (2 mGy) COMPLICATIONS: None immediate. PROCEDURE: Informed written consent was obtained from the patient after a discussion of the risks, benefits, and alternatives to treatment. Questions regarding the procedure were encouraged and answered. The right neck and chest were prepped with chlorhexidine in a sterile fashion, and a sterile drape was applied covering the operative field. Maximum barrier sterile technique with sterile gowns and gloves were used for the procedure. A timeout was performed prior to the  initiation of the procedure. After creating a small venotomy incision, a micropuncture kit was utilized to access the internal jugular vein. Real-time ultrasound guidance was utilized for vascular access including the acquisition of a permanent ultrasound image documenting patency of the accessed vessel. The microwire was utilized to measure appropriate catheter length. A stiff Glidewire was advanced to the level of the IVC and the micropuncture sheath was exchanged for a peel-away sheath. A palindrome tunneled hemodialysis catheter measuring 19 cm from tip to cuff was tunneled in a retrograde fashion from the anterior chest wall to the venotomy incision. The catheter was then placed through the peel-away sheath with tips ultimately positioned within the superior aspect of the right atrium. Final catheter positioning was confirmed and documented with a spot radiographic image. The catheter aspirates and flushes normally. The catheter was flushed with appropriate volume heparin dwells. The catheter exit site was secured with a 0-Prolene retention suture. The venotomy incision was closed with an interrupted 4-0 Vicryl, Dermabond and Steri-strips. Dressings were applied. The patient tolerated the procedure well without immediate post procedural complication. IMPRESSION: Successful placement of 19 cm tip to cuff tunneled hemodialysis catheter via the right internal jugular vein with tips terminating within the superior aspect of the right atrium. The catheter is ready for immediate use. Electronically Signed   By: Simonne Come M.D.   On: 12/07/2022 11:02   IR US Guide Vasc Access Right  Result Date: 12/07/2022 INDICATION: End-stage renal disease. In need of durable intravenous access for initiation of hemodialysis. EXAM: TUNNELED CENTRAL VENOUS HEMODIALYSIS CATHETER PLACEMENT WITH ULTRASOUND AND FLUOROSCOPIC GUIDANCE MEDICATIONS: Patient is currently admitted to the hospital receiving intravenous antibiotics. The  antibiotic was given in an appropriate time interval prior to skin puncture. ANESTHESIA/SEDATION: Moderate (conscious) sedation was employed during this procedure. A total of Versed 1 mg and Fentanyl 50 mcg was administered intravenously. Moderate Sedation Time: 14 minutes. The patient's level of consciousness and vital signs were monitored continuously by radiology nursing throughout the procedure under my direct supervision. FLUOROSCOPY TIME:  18 seconds (2 mGy) COMPLICATIONS: None immediate. PROCEDURE: Informed written consent was obtained from the patient after a discussion of the risks, benefits, and alternatives to treatment. Questions regarding the procedure were encouraged  and answered. The right neck and chest were prepped with chlorhexidine in a sterile fashion, and a sterile drape was applied covering the operative field. Maximum barrier sterile technique with sterile gowns and gloves were used for the procedure. A timeout was performed prior to the initiation of the procedure. After creating a small venotomy incision, a micropuncture kit was utilized to access the internal jugular vein. Real-time ultrasound guidance was utilized for vascular access including the acquisition of a permanent ultrasound image documenting patency of the accessed vessel. The microwire was utilized to measure appropriate catheter length. A stiff Glidewire was advanced to the level of the IVC and the micropuncture sheath was exchanged for a peel-away sheath. A palindrome tunneled hemodialysis catheter measuring 19 cm from tip to cuff was tunneled in a retrograde fashion from the anterior chest wall to the venotomy incision. The catheter was then placed through the peel-away sheath with tips ultimately positioned within the superior aspect of the right atrium. Final catheter positioning was confirmed and documented with a spot radiographic image. The catheter aspirates and flushes normally. The catheter was flushed with  appropriate volume heparin dwells. The catheter exit site was secured with a 0-Prolene retention suture. The venotomy incision was closed with an interrupted 4-0 Vicryl, Dermabond and Steri-strips. Dressings were applied. The patient tolerated the procedure well without immediate post procedural complication. IMPRESSION: Successful placement of 19 cm tip to cuff tunneled hemodialysis catheter via the right internal jugular vein with tips terminating within the superior aspect of the right atrium. The catheter is ready for immediate use. Electronically Signed   By: Simonne Come M.D.   On: 12/07/2022 11:02        Scheduled Meds:  amLODipine  10 mg Oral Daily   calcium acetate  667 mg Oral TID WC   carvedilol  6.25 mg Oral BID WC   Chlorhexidine Gluconate Cloth  6 each Topical Q0600   darbepoetin (ARANESP) injection - DIALYSIS  60 mcg Subcutaneous Q Mon-1800   heparin  5,000 Units Subcutaneous Q8H   hydrALAZINE  25 mg Oral TID   isosorbide mononitrate  15 mg Oral Daily   melatonin  6 mg Oral QHS   Continuous Infusions:   ceFAZolin (ANCEF) IV     [START ON 12/27/2022]  ceFAZolin (ANCEF) IV       LOS: 3 days    Time spent: 35 minutes    Lawrence Roldan Hoover Brunette, DO [facility]  If 7PM-7AM, please contact night-coverage [email or web address] 12/07/2022, 1:00 PM

## 2022-12-07 NOTE — TOC Progression Note (Signed)
Transition of Care [facility]) - Progression Note    Patient Details  Name: KIANTE NINH MRN: [id number] Date of Birth: 11/23/1960  [facility] of [facility]) CM/SW Contact  Leitha Bleak, RN Phone Number: 12/07/2022, 10:03 AM  Clinical Narrative:   [facility] called to say they are out of network with his insurance. CMA starting CLIP paperwork for [facility].  Medical team updated. Patient is currently at [facility] getting tunnel cath placed. TOC following.   Expected Discharge Plan: Home/Self Care Barriers to Discharge: Continued Medical Work up  Expected Discharge Plan and Services In-house Referral: Clinical Social Work   Post Acute Care Choice: Dialysis Living arrangements for the past 2 months: Single Family Home                    Social Determinants of Health (SDOH) Interventions SDOH Screenings   Food Insecurity: No Food Insecurity (12/04/2022)  Housing: Low Risk  (12/04/2022)  Transportation Needs: No Transportation Needs (12/04/2022)  Utilities: Not At Risk (12/04/2022)  Tobacco Use: High Risk (12/05/2022)    Readmission Risk Interventions    12/05/2022   10:54 AM  Readmission Risk Prevention Plan  Transportation Screening Complete  Home Care Screening Complete  Medication Review (RN CM) Complete

## 2022-12-07 NOTE — Progress Notes (Addendum)
Brief nephrology note:  New ESRD patient started HD on 5/27.  He is now at [facility] IR for tunneled HD catheter placement therefore I was not able to see him.  Chart reviewed.  He still has elevated blood pressure and very high BUN and creatinine level.  I spoke with the dialysis nurse who reported to me that patient is still having symptoms of uremia. We will plan for third HD today when he comes back to [facility]. UF as tolerated. Dialysis nurses aware of.  We will continue to follow.  Please call us with question.  Eddie North, [facility].

## 2022-12-08 ENCOUNTER — Telehealth: Payer: Self-pay

## 2022-12-08 DIAGNOSIS — Z131 Encounter for screening for diabetes mellitus: Secondary | ICD-10-CM | POA: Diagnosis not present

## 2022-12-08 DIAGNOSIS — Z13220 Encounter for screening for lipoid disorders: Secondary | ICD-10-CM | POA: Diagnosis not present

## 2022-12-08 DIAGNOSIS — N186 End stage renal disease: Secondary | ICD-10-CM | POA: Diagnosis not present

## 2022-12-08 DIAGNOSIS — Z992 Dependence on renal dialysis: Secondary | ICD-10-CM | POA: Diagnosis not present

## 2022-12-08 NOTE — Telephone Encounter (Signed)
Attempted to reach patient to schedule left arm AVG/AVF, no answer. Left vm for patient to return call.

## 2022-12-09 ENCOUNTER — Other Ambulatory Visit: Payer: Self-pay

## 2022-12-09 DIAGNOSIS — Z992 Dependence on renal dialysis: Secondary | ICD-10-CM | POA: Diagnosis not present

## 2022-12-09 DIAGNOSIS — N186 End stage renal disease: Secondary | ICD-10-CM | POA: Diagnosis not present

## 2022-12-09 NOTE — Telephone Encounter (Signed)
Spoke with patient. Scheduled surgery for 6/18 at [facility]. Instructions provided and patient voiced understanding.

## 2022-12-10 DIAGNOSIS — N186 End stage renal disease: Secondary | ICD-10-CM | POA: Diagnosis not present

## 2022-12-10 DIAGNOSIS — Z992 Dependence on renal dialysis: Secondary | ICD-10-CM | POA: Diagnosis not present

## 2022-12-13 ENCOUNTER — Encounter: Payer: Self-pay | Admitting: Nephrology

## 2022-12-13 DIAGNOSIS — Z992 Dependence on renal dialysis: Secondary | ICD-10-CM | POA: Diagnosis not present

## 2022-12-13 DIAGNOSIS — N186 End stage renal disease: Secondary | ICD-10-CM | POA: Diagnosis not present

## 2022-12-15 DIAGNOSIS — N186 End stage renal disease: Secondary | ICD-10-CM | POA: Diagnosis not present

## 2022-12-15 DIAGNOSIS — Z992 Dependence on renal dialysis: Secondary | ICD-10-CM | POA: Diagnosis not present

## 2022-12-17 DIAGNOSIS — Z992 Dependence on renal dialysis: Secondary | ICD-10-CM | POA: Diagnosis not present

## 2022-12-17 DIAGNOSIS — N186 End stage renal disease: Secondary | ICD-10-CM | POA: Diagnosis not present

## 2022-12-20 DIAGNOSIS — N186 End stage renal disease: Secondary | ICD-10-CM | POA: Diagnosis not present

## 2022-12-20 DIAGNOSIS — Z992 Dependence on renal dialysis: Secondary | ICD-10-CM | POA: Diagnosis not present

## 2022-12-22 DIAGNOSIS — N186 End stage renal disease: Secondary | ICD-10-CM | POA: Diagnosis not present

## 2022-12-22 DIAGNOSIS — Z992 Dependence on renal dialysis: Secondary | ICD-10-CM | POA: Diagnosis not present

## 2022-12-23 ENCOUNTER — Other Ambulatory Visit: Payer: Self-pay

## 2022-12-23 ENCOUNTER — Encounter (HOSPITAL_COMMUNITY)
Admission: RE | Admit: 2022-12-23 | Discharge: 2022-12-23 | Disposition: A | Discharge status: Home / Self Care | Payer: 59 | Source: Ambulatory Visit | Attending: Vascular Surgery | Admitting: Vascular Surgery

## 2022-12-23 ENCOUNTER — Encounter (HOSPITAL_COMMUNITY): Payer: Self-pay

## 2022-12-23 VITALS — Ht 67.0 in | Wt 151.5 lb

## 2022-12-23 DIAGNOSIS — N186 End stage renal disease: Secondary | ICD-10-CM

## 2022-12-23 DIAGNOSIS — F1411 Cocaine abuse, in remission: Secondary | ICD-10-CM

## 2022-12-24 DIAGNOSIS — N186 End stage renal disease: Secondary | ICD-10-CM | POA: Diagnosis not present

## 2022-12-24 DIAGNOSIS — Z992 Dependence on renal dialysis: Secondary | ICD-10-CM | POA: Diagnosis not present

## 2022-12-27 ENCOUNTER — Encounter (HOSPITAL_COMMUNITY)
Admission: RE | Disposition: A | Discharge status: Home / Self Care | Payer: Self-pay | Source: Home / Self Care | Attending: Vascular Surgery

## 2022-12-27 ENCOUNTER — Encounter (HOSPITAL_COMMUNITY): Payer: Self-pay | Admitting: Vascular Surgery

## 2022-12-27 ENCOUNTER — Telehealth: Payer: Self-pay | Admitting: Vascular Surgery

## 2022-12-27 ENCOUNTER — Ambulatory Visit (HOSPITAL_COMMUNITY): Payer: 59 | Admitting: Certified Registered"

## 2022-12-27 ENCOUNTER — Ambulatory Visit (HOSPITAL_BASED_OUTPATIENT_CLINIC_OR_DEPARTMENT_OTHER): Payer: 59 | Admitting: Certified Registered"

## 2022-12-27 ENCOUNTER — Other Ambulatory Visit: Payer: Self-pay

## 2022-12-27 ENCOUNTER — Ambulatory Visit (HOSPITAL_COMMUNITY)
Admission: RE | Admit: 2022-12-27 | Discharge: 2022-12-27 | Disposition: A | Discharge status: Home / Self Care | Payer: 59 | Attending: Vascular Surgery | Admitting: Vascular Surgery

## 2022-12-27 DIAGNOSIS — I132 Hypertensive heart and chronic kidney disease with heart failure and with stage 5 chronic kidney disease, or end stage renal disease: Secondary | ICD-10-CM

## 2022-12-27 DIAGNOSIS — I509 Heart failure, unspecified: Secondary | ICD-10-CM

## 2022-12-27 DIAGNOSIS — D631 Anemia in chronic kidney disease: Secondary | ICD-10-CM | POA: Diagnosis not present

## 2022-12-27 DIAGNOSIS — F17210 Nicotine dependence, cigarettes, uncomplicated: Secondary | ICD-10-CM | POA: Insufficient documentation

## 2022-12-27 DIAGNOSIS — N185 Chronic kidney disease, stage 5: Secondary | ICD-10-CM

## 2022-12-27 DIAGNOSIS — N186 End stage renal disease: Secondary | ICD-10-CM

## 2022-12-27 DIAGNOSIS — Z992 Dependence on renal dialysis: Secondary | ICD-10-CM

## 2022-12-27 DIAGNOSIS — F1411 Cocaine abuse, in remission: Secondary | ICD-10-CM

## 2022-12-27 DIAGNOSIS — I3139 Other pericardial effusion (noninflammatory): Secondary | ICD-10-CM | POA: Insufficient documentation

## 2022-12-27 DIAGNOSIS — I340 Nonrheumatic mitral (valve) insufficiency: Secondary | ICD-10-CM | POA: Insufficient documentation

## 2022-12-27 DIAGNOSIS — Z79899 Other long term (current) drug therapy: Secondary | ICD-10-CM | POA: Diagnosis not present

## 2022-12-27 DIAGNOSIS — I120 Hypertensive chronic kidney disease with stage 5 chronic kidney disease or end stage renal disease: Secondary | ICD-10-CM | POA: Diagnosis not present

## 2022-12-27 HISTORY — PX: AV FISTULA PLACEMENT: SHX1204

## 2022-12-27 LAB — BASIC METABOLIC PANEL
Anion gap: 15 (ref 5–15)
BUN: 98 mg/dL — ABNORMAL HIGH (ref 6–20)
CO2: 21 mmol/L — ABNORMAL LOW (ref 22–32)
Calcium: 7.9 mg/dL — ABNORMAL LOW (ref 8.9–10.3)
Chloride: 99 mmol/L (ref 98–111)
Creatinine, Ser: 13.87 mg/dL — ABNORMAL HIGH (ref 0.61–1.24)
GFR, Estimated: 4 mL/min — ABNORMAL LOW (ref >60)
Glucose, Bld: 90 mg/dL (ref 70–99)
Potassium: 4.4 mmol/L (ref 3.5–5.1)
Sodium: 135 mmol/L (ref 135–145)

## 2022-12-27 LAB — HEMOGLOBIN AND HEMATOCRIT, BLOOD
HCT: 27.1 % — ABNORMAL LOW (ref 39.0–52.0)
Hemoglobin: 8.9 g/dL — ABNORMAL LOW (ref 13.0–17.0)

## 2022-12-27 SURGERY — INSERTION OF ARTERIOVENOUS (AV) GORE-TEX GRAFT ARM
Anesthesia: General | Site: Arm Lower | Laterality: Left

## 2022-12-27 MED ORDER — MIDAZOLAM HCL 2 MG/2ML IJ SOLN
INTRAMUSCULAR | Status: AC
  Filled 2022-12-27: qty 2

## 2022-12-27 MED ORDER — PHENYLEPHRINE HCL-NACL 20-0.9 MG/250ML-% IV SOLN
INTRAVENOUS | Status: AC
  Filled 2022-12-27: qty 250

## 2022-12-27 MED ORDER — HEPARIN SODIUM (PORCINE) 1000 UNIT/ML IJ SOLN
INTRAMUSCULAR | Status: AC
  Filled 2022-12-27: qty 6

## 2022-12-27 MED ORDER — PROPOFOL 500 MG/50ML IV EMUL
INTRAVENOUS | Status: AC
  Filled 2022-12-27: qty 50

## 2022-12-27 MED ORDER — PHENYLEPHRINE HCL-NACL 20-0.9 MG/250ML-% IV SOLN
INTRAVENOUS | Status: DC | PRN
  Administered 2022-12-27: 30 ug/min via INTRAVENOUS

## 2022-12-27 MED ORDER — ORAL CARE MOUTH RINSE: 15.0000 mL | Freq: Once | OROMUCOSAL | Status: AC

## 2022-12-27 MED ORDER — MIDAZOLAM HCL 2 MG/2ML IJ SOLN
2.0000 mg | Freq: Once | INTRAMUSCULAR | Status: AC
  Administered 2022-12-27: 2 mg via INTRAVENOUS

## 2022-12-27 MED ORDER — PHENYLEPHRINE 80 MCG/ML (10ML) SYRINGE FOR IV PUSH (FOR BLOOD PRESSURE SUPPORT)
PREFILLED_SYRINGE | INTRAVENOUS | Status: DC | PRN
  Administered 2022-12-27: 160 ug via INTRAVENOUS

## 2022-12-27 MED ORDER — PROPOFOL 10 MG/ML IV BOLUS
INTRAVENOUS | Status: AC
  Filled 2022-12-27: qty 20

## 2022-12-27 MED ORDER — KETAMINE HCL 10 MG/ML IJ SOLN
INTRAMUSCULAR | Status: DC | PRN
  Administered 2022-12-27 (×2): 20 mg via INTRAVENOUS
  Administered 2022-12-27: 10 mg via INTRAVENOUS

## 2022-12-27 MED ORDER — LIDOCAINE-EPINEPHRINE 0.5 %-1:200000 IJ SOLN
INTRAMUSCULAR | Status: DC | PRN
  Administered 2022-12-27: 7 mL

## 2022-12-27 MED ORDER — LIDOCAINE 2% (20 MG/ML) 5 ML SYRINGE
INTRAMUSCULAR | Status: DC | PRN
  Administered 2022-12-27: 60 mg via INTRAVENOUS

## 2022-12-27 MED ORDER — 0.9 % SODIUM CHLORIDE (POUR BTL) OPTIME
TOPICAL | Status: DC | PRN
  Administered 2022-12-27: 1000 mL

## 2022-12-27 MED ORDER — CHLORHEXIDINE GLUCONATE 4 % EX SOLN: 60.0000 mL | Freq: Once | CUTANEOUS | Status: DC

## 2022-12-27 MED ORDER — SODIUM CHLORIDE 0.9 % IV SOLN: INTRAVENOUS | Status: DC

## 2022-12-27 MED ORDER — FENTANYL CITRATE (PF) 100 MCG/2ML IJ SOLN
INTRAMUSCULAR | Status: AC
  Filled 2022-12-27: qty 2

## 2022-12-27 MED ORDER — LIDOCAINE-EPINEPHRINE 0.5 %-1:200000 IJ SOLN
INTRAMUSCULAR | Status: AC
  Filled 2022-12-27: qty 50

## 2022-12-27 MED ORDER — PROPOFOL 10 MG/ML IV BOLUS
INTRAVENOUS | Status: DC | PRN
  Administered 2022-12-27 (×3): 30 mg via INTRAVENOUS

## 2022-12-27 MED ORDER — CEFAZOLIN SODIUM-DEXTROSE 2-4 GM/100ML-% IV SOLN
INTRAVENOUS | Status: AC
  Filled 2022-12-27: qty 100

## 2022-12-27 MED ORDER — HEPARIN 6000 UNIT IRRIGATION SOLUTION
Status: DC | PRN
  Administered 2022-12-27: 1

## 2022-12-27 MED ORDER — ONDANSETRON HCL 4 MG/2ML IJ SOLN
INTRAMUSCULAR | Status: DC | PRN
  Administered 2022-12-27: 4 mg via INTRAVENOUS

## 2022-12-27 MED ORDER — CEFAZOLIN SODIUM-DEXTROSE 2-4 GM/100ML-% IV SOLN
2.0000 g | INTRAVENOUS | Status: AC
  Administered 2022-12-27: 2 g via INTRAVENOUS

## 2022-12-27 MED ORDER — FENTANYL CITRATE (PF) 100 MCG/2ML IJ SOLN
INTRAMUSCULAR | Status: DC | PRN
  Administered 2022-12-27 (×2): 25 ug via INTRAVENOUS

## 2022-12-27 MED ORDER — OXYCODONE-ACETAMINOPHEN 5-325 MG PO TABS: 1.0000 | ORAL_TABLET | Freq: Four times a day (QID) | ORAL | 0 refills | Status: DC | PRN

## 2022-12-27 MED ORDER — DEXMEDETOMIDINE HCL IN NACL 80 MCG/20ML IV SOLN
INTRAVENOUS | Status: AC
  Filled 2022-12-27: qty 20

## 2022-12-27 MED ORDER — LIDOCAINE HCL (PF) 2 % IJ SOLN
INTRAMUSCULAR | Status: AC
  Filled 2022-12-27: qty 5

## 2022-12-27 MED ORDER — KETAMINE HCL 50 MG/5ML IJ SOSY
PREFILLED_SYRINGE | INTRAMUSCULAR | Status: AC
  Filled 2022-12-27: qty 5

## 2022-12-27 MED ORDER — PROPOFOL 500 MG/50ML IV EMUL
INTRAVENOUS | Status: DC | PRN
  Administered 2022-12-27: 30 ug/kg/min via INTRAVENOUS

## 2022-12-27 MED ORDER — ONDANSETRON HCL 4 MG/2ML IJ SOLN
INTRAMUSCULAR | Status: AC
  Filled 2022-12-27: qty 2

## 2022-12-27 MED ORDER — CHLORHEXIDINE GLUCONATE 0.12 % MT SOLN
15.0000 mL | Freq: Once | OROMUCOSAL | Status: AC
  Administered 2022-12-27: 15 mL via OROMUCOSAL

## 2022-12-27 MED ORDER — DEXMEDETOMIDINE HCL IN NACL 80 MCG/20ML IV SOLN
INTRAVENOUS | Status: DC | PRN
  Administered 2022-12-27 (×3): 8 ug via INTRAVENOUS

## 2022-12-27 SURGICAL SUPPLY — 39 items
ADH SKN CLS APL DERMABOND .7 (GAUZE/BANDAGES/DRESSINGS) ×1
ARMBAND PINK RESTRICT EXTREMIT (MISCELLANEOUS) ×2 IMPLANT
BAG HAMPER (MISCELLANEOUS) ×2 IMPLANT
CANNULA VESSEL 3MM 2 BLNT TIP (CANNULA) ×2 IMPLANT
CLIP LIGATING EXTRA MED SLVR (CLIP) ×2 IMPLANT
CLIP LIGATING EXTRA SM BLUE (MISCELLANEOUS) ×2 IMPLANT
COVER LIGHT HANDLE STERIS (MISCELLANEOUS) ×4 IMPLANT
COVER MAYO STAND XLG (MISCELLANEOUS) ×2 IMPLANT
DECANTER SPIKE VIAL GLASS SM (MISCELLANEOUS) ×2 IMPLANT
DERMABOND ADVANCED .7 DNX12 (GAUZE/BANDAGES/DRESSINGS) ×2 IMPLANT
ELECT REM PT RETURN 9FT ADLT (ELECTROSURGICAL) ×1
ELECTRODE REM PT RTRN 9FT ADLT (ELECTROSURGICAL) ×2 IMPLANT
GAUZE SPONGE 4X4 12PLY STRL (GAUZE/BANDAGES/DRESSINGS) ×4 IMPLANT
GLOVE BIOGEL PI IND STRL 7.0 (GLOVE) ×4 IMPLANT
GLOVE SURG MICRO LTX SZ7.5 (GLOVE) ×2 IMPLANT
GOWN STRL REUS W/TWL LRG LVL3 (GOWN DISPOSABLE) ×6 IMPLANT
IV NS 500ML (IV SOLUTION) ×2
IV NS 500ML BAXH (IV SOLUTION) ×4 IMPLANT
KIT BLADEGUARD II DBL (SET/KITS/TRAYS/PACK) ×2 IMPLANT
KIT TURNOVER KIT A (KITS) ×2 IMPLANT
MANIFOLD NEPTUNE II (INSTRUMENTS) ×2 IMPLANT
MARKER SKIN DUAL TIP RULER LAB (MISCELLANEOUS) ×4 IMPLANT
NDL HYPO 18GX1.5 BLUNT FILL (NEEDLE) ×2 IMPLANT
NEEDLE HYPO 18GX1.5 BLUNT FILL (NEEDLE) ×1 IMPLANT
NS IRRIG 1000ML POUR BTL (IV SOLUTION) ×2 IMPLANT
PACK CV ACCESS (CUSTOM PROCEDURE TRAY) ×2 IMPLANT
PAD ARMBOARD 7.5X6 YLW CONV (MISCELLANEOUS) ×2 IMPLANT
SET BASIN LINEN APH (SET/KITS/TRAYS/PACK) ×2 IMPLANT
SOL PREP POV-IOD 4OZ 10% (MISCELLANEOUS) ×2 IMPLANT
SOL PREP PROV IODINE SCRUB 4OZ (MISCELLANEOUS) ×2 IMPLANT
SUT PROLENE 6 0 CC (SUTURE) ×2 IMPLANT
SUT SILK 2 0 FSL 18 (SUTURE) ×2 IMPLANT
SUT VIC AB 3-0 SH 27 (SUTURE) ×1
SUT VIC AB 3-0 SH 27X BRD (SUTURE) ×2 IMPLANT
SYR 10ML LL (SYRINGE) ×2 IMPLANT
SYR 50ML LL SCALE MARK (SYRINGE) ×2 IMPLANT
SYR CONTROL 10ML LL (SYRINGE) ×2 IMPLANT
SYR TOOMEY 50ML (SYRINGE) IMPLANT
UNDERPAD 30X36 HEAVY ABSORB (UNDERPADS AND DIAPERS) ×2 IMPLANT

## 2022-12-27 NOTE — Telephone Encounter (Signed)
-----   Message from Larina Earthly, MD sent at 12/27/2022  9:07 AM EDT -----  Please schedule for follow-up in 4 to 6 weeks.  Preferably in [place] if possible.  If not, [facility]

## 2022-12-27 NOTE — Transfer of Care (Signed)
Immediate Anesthesia Transfer of Care Note  Patient: Damon Williams  Procedure(s) Performed: INSERTION OF LEFT ARM  ARTERIOVENOUS FISTULA (Left: Arm Lower)  Patient Location: PACU  Anesthesia Type:General  Level of Consciousness: awake, alert , and oriented  Airway & Oxygen Therapy: Patient Spontanous Breathing  Post-op Assessment: Report given to RN and Post -op Vital signs reviewed and stable  Post vital signs: Reviewed and stable  Last Vitals:  Vitals Value Taken Time  BP    Temp    Pulse    Resp    SpO2      Last Pain: There were no vitals filed for this visit.       Complications: No notable events documented.

## 2022-12-27 NOTE — Op Note (Signed)
    OPERATIVE REPORT  DATE OF SURGERY: 12/27/2022  PATIENT: Damon Williams, 62 y.o. male MRN: [id number]  DOB: [birth year]  PRE-OPERATIVE DIAGNOSIS: End-stage renal disease  POST-OPERATIVE DIAGNOSIS:  Same  PROCEDURE: Left brachiocephalic AV fistula creation  SURGEON:  Gretta Began, M.D.  PHYSICIAN ASSISTANT: Rebeca Alert, RNFA  The assistant was needed for exposure and to expedite the case  ANESTHESIA: Local with sedation  EBL: per anesthesia record  Total I/O In: 400 [I.V.:300; IV Piggyback:100] Out: -   BLOOD ADMINISTERED: none  DRAINS: none  SPECIMEN: none  COUNTS CORRECT:  YES  PATIENT DISPOSITION:  PACU - hemodynamically stable  PROCEDURE DETAILS: The patient was taken everyplace supine position where the area of the left arm was prepped draped in usual sterile fashion.  Incision was made over the antecubital space and carried down to isolate the cephalic vein.  The patient did have a thrombosis of the lateral branch of this from recent IV.  Ultrasound did confirm that the main cephalic vein was patent.  The vein was mobilized proximally and distally and the thrombosed tributary was ligated and divided.  The brachial artery was exposed through the same incision.  This was of good caliber with minimal atherosclerotic change.  The artery was mobilized circumferentially.  The vein was ligated distally and divided and was brought into approximation to the artery.  The artery was occluded proximally and distally with fistula clamps and was opened with an 11 blade and sent longitudinally with Potts scissors.  The vein was spatulated and sewn end-to-side to the artery with a running 6-0 Prolene suture.  Clamps were removed and excellent thrill was noted.  The wounds were irrigated with saline.  Wound was closed with 3-0 Vicryl in the subcutaneous and subcuticular tissue.  Sterile dressing was applied.  Patient maintained a left radial pulse and was transferred to the recovery room  in stable condition   Larina Earthly, M.D., [facility] 12/27/2022 9:08 AM  Note: Portions of this report may have been transcribed using voice recognition software.  Every effort has been made to ensure accuracy; however, inadvertent computerized transcription errors may still be present.

## 2022-12-27 NOTE — Interval H&P Note (Signed)
History and Physical Interval Note:  12/27/2022 7:10 AM  Damon Williams  has presented today for surgery, with the diagnosis of ESRD.  The various methods of treatment have been discussed with the patient and family. After consideration of risks, benefits and other options for treatment, the patient has consented to  Procedure(s): INSERTION OF LEFT ARM ARTERIOVENOUS (AV) GORE-TEX GRAFT VERSUS ARTERIOVENOUS FISTULA (Left) as a surgical intervention.  The patient's history has been reviewed, patient examined, no change in status, stable for surgery.  I have reviewed the patient's chart and labs.  Questions were answered to the patient's satisfaction.     Gretta Began

## 2022-12-27 NOTE — Anesthesia Postprocedure Evaluation (Signed)
Anesthesia Post Note  Patient: Damon Williams  Procedure(s) Performed: INSERTION OF LEFT ARM  ARTERIOVENOUS FISTULA (Left: Arm Lower)  Patient location during evaluation: Phase II Anesthesia Type: General Level of consciousness: awake and alert and oriented Pain management: pain level controlled Vital Signs Assessment: post-procedure vital signs reviewed and stable Respiratory status: spontaneous breathing, nonlabored ventilation and respiratory function stable Cardiovascular status: blood pressure returned to baseline and stable Postop Assessment: no apparent nausea or vomiting Anesthetic complications: no  No notable events documented.   Last Vitals:  Vitals:   12/27/22 0945 12/27/22 0954  BP: (!) 164/91 (!) 151/92  Pulse: 77 75  Resp: 19 17  Temp:  36.5 C  SpO2: 90% 92%    Last Pain:  Vitals:   12/27/22 1004  PainSc: 0-No pain                 Hendricks Schwandt C Wayman Hoard

## 2022-12-27 NOTE — Anesthesia Procedure Notes (Addendum)
Date/Time: 12/27/2022 8:10 AM  Performed by: Julian Reil, CRNAPre-anesthesia Checklist: Patient identified, Emergency Drugs available, Suction available and Patient being monitored Patient Re-evaluated:Patient Re-evaluated prior to induction Oxygen Delivery Method: Simple face mask Induction Type: IV induction Ventilation: Oral airway inserted - appropriate to patient size Airway Equipment and Method: Oral airway Placement Confirmation: positive ETCO2

## 2022-12-27 NOTE — Anesthesia Preprocedure Evaluation (Addendum)
Anesthesia Evaluation  Patient identified by MRN, date of birth, ID band Patient awake    Reviewed: Allergy & Precautions, H&P , NPO status , Patient's Chart, lab work & pertinent test results  History of Anesthesia Complications Negative for: history of anesthetic complications  Airway Mallampati: II  TM Distance: >3 FB Neck ROM: Full    Dental  (+) Edentulous Upper, Edentulous Lower   Pulmonary Current Smoker and Patient abstained from smoking.   Pulmonary exam normal breath sounds clear to auscultation       Cardiovascular hypertension, Pt. on medications +CHF  Normal cardiovascular exam Rhythm:Regular Rate:Normal  1. Left ventricular ejection fraction, by estimation, is 30 to 35%. The  left ventricle has moderately decreased function. The left ventricle  demonstrates global hypokinesis. The left ventricular internal cavity size  was mildly to moderately dilated.  There is mild concentric left ventricular hypertrophy. Left ventricular  diastolic parameters are consistent with Grade II diastolic dysfunction  (pseudonormalization).   2. Right ventricular systolic function is low normal. The right  ventricular size is normal. Tricuspid regurgitation signal is inadequate  for assessing PA pressure.   3. Left atrial size was moderately dilated.   4. Right atrial size was mildly dilated.   5. A small pericardial effusion is present. The pericardial effusion is  posterior to the left ventricle.   6. The mitral valve is grossly normal, mildly thickened. Mild mitral  valve regurgitation.   7. The aortic valve is tricuspid. Aortic valve regurgitation is not  visualized.   8. Aortic dilatation noted. There is moderate dilatation of the aortic  root, measuring 43 mm.   9. The inferior vena cava is dilated in size with >50% respiratory  variability, suggesting right atrial pressure of 8 mmHg.      Neuro/Psych negative  neurological ROS  negative psych ROS   GI/Hepatic negative GI ROS,,,(+)     substance abuse  cocaine use  Endo/Other  negative endocrine ROS    Renal/GU ESRF and DialysisRenal disease  negative genitourinary   Musculoskeletal negative musculoskeletal ROS (+)    Abdominal   Peds negative pediatric ROS (+)  Hematology  (+) Blood dyscrasia, anemia   Anesthesia Other Findings   Reproductive/Obstetrics negative OB ROS                             Anesthesia Physical Anesthesia Plan  ASA: 4  Anesthesia Plan: General   Post-op Pain Management: Minimal or no pain anticipated   Induction: Intravenous  PONV Risk Score and Plan: 2 and Ondansetron and Dexamethasone  Airway Management Planned: Oral ETT  Additional Equipment:   Intra-op Plan:   Post-operative Plan: Extubation in OR  Informed Consent: I have reviewed the patients History and Physical, chart, labs and discussed the procedure including the risks, benefits and alternatives for the proposed anesthesia with the patient or authorized representative who has indicated his/her understanding and acceptance.     Dental advisory given  Plan Discussed with: CRNA and Surgeon  Anesthesia Plan Comments:         Anesthesia Quick Evaluation

## 2022-12-27 NOTE — Telephone Encounter (Signed)
Lvm to schedule appt.

## 2022-12-27 NOTE — Discharge Instructions (Signed)
[facility]  Discharge Instructions  AV Fistula or Graft Surgery for Dialysis Access  Please refer to the following instructions for your post-procedure care. Your surgeon or physician assistant will discuss any changes with you.  Activity  You may drive the day following your surgery, if you are comfortable and no longer taking prescription pain medication. Resume full activity as the soreness in your incision resolves.  Bathing/Showering  You may shower after you go home. Keep your incision dry for 48 hours. Do not soak in a bathtub, hot tub, or swim until the incision heals completely. You may not shower if you have a hemodialysis catheter.  Incision Care  Clean your incision with mild soap and water after 48 hours. Pat the area dry with a clean towel. You do not need a bandage unless otherwise instructed. Do not apply any ointments or creams to your incision. You may have skin glue on your incision. Do not peel it off. It will come off on its own in about one week. Your arm may swell a bit after surgery. To reduce swelling use pillows to elevate your arm so it is above your heart. Your doctor will tell you if you need to lightly wrap your arm with an ACE bandage.  Diet  Resume your normal diet. There are not special food restrictions following this procedure. In order to heal from your surgery, it is CRITICAL to get adequate nutrition. Your body requires vitamins, minerals, and protein. Vegetables are the best source of vitamins and minerals. Vegetables also provide the perfect balance of protein. Processed food has little nutritional value, so try to avoid this.  Medications  Resume taking all of your medications. If your incision is causing pain, you may take over-the counter pain relievers such as acetaminophen (Tylenol). If you were prescribed a stronger pain medication, please be aware these medications can cause nausea and constipation. Prevent  nausea by taking the medication with a snack or meal. Avoid constipation by drinking plenty of fluids and eating foods with high amount of fiber, such as fruits, vegetables, and grains.  Do not take Tylenol if you are taking prescription pain medications.  Follow up Your surgeon may want to see you in the office following your access surgery. If so, this will be arranged at the time of your surgery.  Please call us immediately for any of the following conditions:  Increased pain, redness, drainage (pus) from your incision site Fever of 101 degrees or higher Severe or worsening pain at your incision site Hand pain or numbness.  Reduce your risk of vascular disease:  Stop smoking. If you would like help, call [facility] at 1-800-QUIT-NOW (636-163-6453) or [place] at (706)092-5585  Manage your cholesterol Maintain a desired weight Control your diabetes Keep your blood pressure down  Dialysis  It will take several weeks to several months for your new dialysis access to be ready for use. Your surgeon will determine when it is okay to use it. Your nephrologist will continue to direct your dialysis. You can continue to use your Permcath until your new access is ready for use.   12/27/2022 Damon Williams [id number] 1961/02/12  Surgeon(s): Marliss Buttacavoli, Kristen Loader, MD  Procedure(s): INSERTION OF LEFT ARM  ARTERIOVENOUS FISTULA   May stick graft immediately   May stick graft on designated area only:    Do not stick fistula for 12 weeks    If you have any questions, please call the office at  [phone].  

## 2022-12-28 DIAGNOSIS — Z992 Dependence on renal dialysis: Secondary | ICD-10-CM | POA: Diagnosis not present

## 2022-12-28 DIAGNOSIS — N186 End stage renal disease: Secondary | ICD-10-CM | POA: Diagnosis not present

## 2022-12-29 ENCOUNTER — Encounter (HOSPITAL_COMMUNITY): Payer: Self-pay | Admitting: Vascular Surgery

## 2022-12-29 DIAGNOSIS — N186 End stage renal disease: Secondary | ICD-10-CM | POA: Diagnosis not present

## 2022-12-29 DIAGNOSIS — Z992 Dependence on renal dialysis: Secondary | ICD-10-CM | POA: Diagnosis not present

## 2022-12-31 DIAGNOSIS — N186 End stage renal disease: Secondary | ICD-10-CM | POA: Diagnosis not present

## 2022-12-31 DIAGNOSIS — Z992 Dependence on renal dialysis: Secondary | ICD-10-CM | POA: Diagnosis not present

## 2023-01-03 DIAGNOSIS — N186 End stage renal disease: Secondary | ICD-10-CM | POA: Diagnosis not present

## 2023-01-03 DIAGNOSIS — Z992 Dependence on renal dialysis: Secondary | ICD-10-CM | POA: Diagnosis not present

## 2023-01-05 DIAGNOSIS — Z992 Dependence on renal dialysis: Secondary | ICD-10-CM | POA: Diagnosis not present

## 2023-01-05 DIAGNOSIS — N186 End stage renal disease: Secondary | ICD-10-CM | POA: Diagnosis not present

## 2023-01-06 NOTE — Telephone Encounter (Signed)
Appt has been scheduled.

## 2023-01-08 DIAGNOSIS — N186 End stage renal disease: Secondary | ICD-10-CM | POA: Diagnosis not present

## 2023-01-08 DIAGNOSIS — Z992 Dependence on renal dialysis: Secondary | ICD-10-CM | POA: Diagnosis not present

## 2023-01-09 ENCOUNTER — Ambulatory Visit: Payer: Medicaid Other | Admitting: Medical

## 2023-01-09 DIAGNOSIS — Z992 Dependence on renal dialysis: Secondary | ICD-10-CM | POA: Diagnosis not present

## 2023-01-09 DIAGNOSIS — N186 End stage renal disease: Secondary | ICD-10-CM | POA: Diagnosis not present

## 2023-01-12 DIAGNOSIS — Z992 Dependence on renal dialysis: Secondary | ICD-10-CM | POA: Diagnosis not present

## 2023-01-12 DIAGNOSIS — N186 End stage renal disease: Secondary | ICD-10-CM | POA: Diagnosis not present

## 2023-01-14 DIAGNOSIS — N186 End stage renal disease: Secondary | ICD-10-CM | POA: Diagnosis not present

## 2023-01-14 DIAGNOSIS — Z992 Dependence on renal dialysis: Secondary | ICD-10-CM | POA: Diagnosis not present

## 2023-01-17 DIAGNOSIS — Z992 Dependence on renal dialysis: Secondary | ICD-10-CM | POA: Diagnosis not present

## 2023-01-17 DIAGNOSIS — N186 End stage renal disease: Secondary | ICD-10-CM | POA: Diagnosis not present

## 2023-01-17 DIAGNOSIS — Z131 Encounter for screening for diabetes mellitus: Secondary | ICD-10-CM | POA: Diagnosis not present

## 2023-01-19 DIAGNOSIS — N186 End stage renal disease: Secondary | ICD-10-CM | POA: Diagnosis not present

## 2023-01-19 DIAGNOSIS — Z992 Dependence on renal dialysis: Secondary | ICD-10-CM | POA: Diagnosis not present

## 2023-01-20 ENCOUNTER — Ambulatory Visit: Payer: 59 | Attending: Cardiology | Admitting: Nurse Practitioner

## 2023-01-20 ENCOUNTER — Encounter: Payer: Self-pay | Admitting: Nurse Practitioner

## 2023-01-20 VITALS — BP 155/72 | HR 98 | Ht 67.5 in | Wt 154.8 lb

## 2023-01-20 DIAGNOSIS — I10 Essential (primary) hypertension: Secondary | ICD-10-CM

## 2023-01-20 DIAGNOSIS — Z992 Dependence on renal dialysis: Secondary | ICD-10-CM

## 2023-01-20 DIAGNOSIS — I340 Nonrheumatic mitral (valve) insufficiency: Secondary | ICD-10-CM | POA: Diagnosis not present

## 2023-01-20 DIAGNOSIS — I77819 Aortic ectasia, unspecified site: Secondary | ICD-10-CM | POA: Diagnosis not present

## 2023-01-20 DIAGNOSIS — I43 Cardiomyopathy in diseases classified elsewhere: Secondary | ICD-10-CM

## 2023-01-20 DIAGNOSIS — I110 Hypertensive heart disease with heart failure: Secondary | ICD-10-CM

## 2023-01-20 DIAGNOSIS — I428 Other cardiomyopathies: Secondary | ICD-10-CM

## 2023-01-20 DIAGNOSIS — I3139 Other pericardial effusion (noninflammatory): Secondary | ICD-10-CM

## 2023-01-20 DIAGNOSIS — N186 End stage renal disease: Secondary | ICD-10-CM | POA: Diagnosis not present

## 2023-01-20 MED ORDER — CARVEDILOL 6.25 MG PO TABS: 6.2500 mg | ORAL_TABLET | Freq: Two times a day (BID) | ORAL | 2 refills | Status: DC

## 2023-01-20 NOTE — Patient Instructions (Addendum)
Medication Instructions:  Your physician recommends that you continue on your current medications as directed. Please refer to the Current Medication list given to you today.  Labwork: none  Testing/Procedures: none  Follow-Up: Your physician recommends that you schedule a follow-up appointment in: 6-8 weeks with Philis Nettle   Any Other Special Instructions Will Be Listed Below (If Applicable).  HEART FAILURE INSTRUCTION SHEET  Follow a low-salt diet-you are allowed no more than 2,000 mg of sodium per day. Watch your fluid intake. In general, you should not be taking more than 64 ounces a day (no more than 8 glasses per day). Sometimes we refer to this as "2 liters per day." This includes sources of water in food like soup, coffee, tea, milk etc. Weigh yourself on the same scale at the same time of the day preferably immediately after your first void. Keep a log of your weights. Call your doctor: (Anytime you feel any of the following symptoms)  3 lbs weight gain overnight or 5 lbs within a week Shortness of breath, with or without a day hacking cough Swelling in hands, feet or stomach If you have to sleep on extra pillows at night in order to breathe   IT IS IMPORTANT TO LET YOUR DOCTOR KNOW EARLY ON IF YOU ARE HAVING SYMPTOMS SO WE CAN HELP YOU!    If you need a refill on your cardiac medications before your next appointment, please call your pharmacy.

## 2023-01-20 NOTE — Progress Notes (Addendum)
Cardiology Office Note:  .   Date:  01/20/2023 ID:  Damon Williams, DOB 1960/09/20, MRN [id number] PCP: Kara Pacer, NP  [facility] Providers Cardiologist:  Dina Rich, MD    History of Present Illness: .   Damon Williams is a 62 y.o. male with a PMH of HTN, ESRD, hx of polysubstance abuse (current cocaine user, current smoker, former ETOH abuse), pericardial effusion, MR, aortic dilatation, and new onset cardiomyopathy, who is seen today for hospital follow-up.   Admitted 10/2022 for uncontrolled HTN, AKI on CKD stage IV, found to have new onset cardiomyopathy with EF found to be 30-35%, grade 2 DD with small pericardial effusion. Evaluated by Cards, Dr. Wyline Mood stated not to start ACE/ARB/ARNI/MRA/SGLT2i given renal dysfunction.   Readmitted 11/2022 for shortness of breath, found to be in volume overload in setting of AKI on CKD stage IV -> ESRD, was started on HD. Established dialysis with [facility] MWF schedule.   Today he presents for hospital follow-up. He states he is doing well.  He has stopped cocaine, alcohol, and tobacco.  Currently undergoing hemodialysis on Tuesday, Thursday, and Saturday.  Now he is following Dr. Wolfgang Phoenix for his kidney disease. Denies any chest pain, shortness of breath, palpitations, syncope, presyncope, dizziness, orthopnea, PND, significant weight changes, acute bleeding, or claudication.  Does admit to leg swelling, stable over time.  BP is mildly elevated today due to the fact he has not taken his BP medications prior to office visit.  States he checks his blood pressure at home-typically well-controlled.  Family History: Hypercholesterolemia: Mother and Father Stroke: Paternal Grandfather HTN: Brother and Sister  Studies Reviewed: .    Echo 10/2022: 1. Left ventricular ejection fraction, by estimation, is 30 to 35%. The  left ventricle has moderately decreased function. The left ventricle  demonstrates global hypokinesis. The  left ventricular internal cavity size  was mildly to moderately dilated.  There is mild concentric left ventricular hypertrophy. Left ventricular  diastolic parameters are consistent with Grade II diastolic dysfunction  (pseudonormalization).   2. Right ventricular systolic function is low normal. The right  ventricular size is normal. Tricuspid regurgitation signal is inadequate  for assessing PA pressure.   3. Left atrial size was moderately dilated.   4. Right atrial size was mildly dilated.   5. A small pericardial effusion is present. The pericardial effusion is  posterior to the left ventricle.   6. The mitral valve is grossly normal, mildly thickened. Mild mitral  valve regurgitation.   7. The aortic valve is tricuspid. Aortic valve regurgitation is not  visualized.   8. Aortic dilatation noted. There is moderate dilatation of the aortic  root, measuring 43 mm.   9. The inferior vena cava is dilated in size with >50% respiratory  variability, suggesting right atrial pressure of 8 mmHg.   Comparison(s): No prior Echocardiogram. Consider hypertensive versus  infiltrative cardiomyopathy.  Risk Assessment/Calculations:    HYPERTENSION CONTROL Vitals:   01/20/23 0837 01/20/23 0845  BP: (!) 140/88 (!) 155/72    The patient's blood pressure is elevated above target today.  In order to address the patient's elevated BP: Blood pressure will be monitored at home to determine if medication changes need to be made.; Follow up with general cardiology has been recommended.   Patient has not taken his BP medications this morning prior to this office visit.        Physical Exam:   VS:  BP (!) 155/72 (BP Location: Right Arm,  Patient Position: Sitting, Cuff Size: Normal)   Pulse 98   Ht 5' 7.5" (1.715 m)   Wt 154 lb 12.8 oz (70.2 kg)   SpO2 96%   BMI 23.89 kg/m    Wt Readings from Last 3 Encounters:  01/20/23 154 lb 12.8 oz (70.2 kg)  12/23/22 151 lb 7.3 oz (68.7 kg)  12/07/22  151 lb 7.3 oz (68.7 kg)    GEN: Well nourished, well developed in no acute distress NECK: No JVD; No carotid bruits CARDIAC: S1/S2, RRR, no murmurs, rubs, gallops RESPIRATORY:  Clear to auscultation without rales, wheezing or rhonchi  ABDOMEN: Soft, non-tender, non-distended EXTREMITIES:  No edema; No deformity, graft along LUE is waiting to mature  ASSESSMENT AND PLAN: .    Hypertensive cardiomyopathy, NICM Newly diagnosed 10/2022. Stage C, NYHA class I symptoms. TTE revealed EF 30-35%, grade 2 DD, was recommended to consider hypertensive vs infiltrative CM. No FH of CHF in his family per his report. Euvolemic and well compensated on exam. GDMT limited d/t ESRD. Continue Coreg, hydralazine, and Imdur. Low sodium diet, fluid restriction <2L, and daily weights encouraged. Educated to contact our office for weight gain of 2 lbs overnight or 5 lbs in one week. No longer using substances, I congratulated him. Will route note to Dr. Wyline Mood to determine if cMRI should be considered and/or cardiac rehab. Plan to consider discussing updating limited Echo later this year to evaluate EF.  HTN BP elevated today, has not taken BP medications today. Discussed the importance of medication compliance. Discussed to monitor BP at home at least 2 hours after medications and sitting for 5-10 minutes. Goal SBP < 130. Continue medication regimen. Low salt, heart healthy diet encouraged. Will refill Coreg per his request.   3. Pericardial effusion Small pericardial effusion noted on TTE, pt denies any red flag symptoms. Will continue to monitor at this time. ED precautions discussed.   4. Mitral regurgitation Mild mitral regurgitation noted on TTE. Will continue to monitor over time with serial echos.   5. Aortic dilatation Moderate dilatation of aortic root at 43 mm. Will discuss updating Echo at future OV as mentioned above. Care and ED precautions discussed. Heart healthy diet encouraged.   6. ESRD on HD    Currently on a Tues/Thurs/Sat schedule for HD and getting treatments through port on right upper chest, current graft along left upper arm is waiting to mature. No medication changes at this time. Continue to follow-up with Dr. Wolfgang Phoenix.   Dispo: Follow-up with me or APP in 6-8 weeks or sooner if anything changes.   Signed, Sharlene Dory, NP

## 2023-01-21 ENCOUNTER — Encounter: Payer: Self-pay | Admitting: Nurse Practitioner

## 2023-01-21 DIAGNOSIS — Z992 Dependence on renal dialysis: Secondary | ICD-10-CM | POA: Diagnosis not present

## 2023-01-21 DIAGNOSIS — N186 End stage renal disease: Secondary | ICD-10-CM | POA: Diagnosis not present

## 2023-01-24 DIAGNOSIS — Z992 Dependence on renal dialysis: Secondary | ICD-10-CM | POA: Diagnosis not present

## 2023-01-24 DIAGNOSIS — N186 End stage renal disease: Secondary | ICD-10-CM | POA: Diagnosis not present

## 2023-01-26 DIAGNOSIS — Z992 Dependence on renal dialysis: Secondary | ICD-10-CM | POA: Diagnosis not present

## 2023-01-26 DIAGNOSIS — N186 End stage renal disease: Secondary | ICD-10-CM | POA: Diagnosis not present

## 2023-01-27 ENCOUNTER — Other Ambulatory Visit: Payer: Self-pay

## 2023-01-27 ENCOUNTER — Observation Stay (HOSPITAL_COMMUNITY)
Admission: EM | Admit: 2023-01-27 | Discharge: 2023-01-29 | Disposition: A | Discharge status: Home / Self Care | Payer: 59 | Attending: Family Medicine | Admitting: Family Medicine

## 2023-01-27 ENCOUNTER — Emergency Department (HOSPITAL_COMMUNITY): Payer: 59

## 2023-01-27 ENCOUNTER — Encounter (HOSPITAL_COMMUNITY): Payer: Self-pay | Admitting: Emergency Medicine

## 2023-01-27 DIAGNOSIS — R0989 Other specified symptoms and signs involving the circulatory and respiratory systems: Secondary | ICD-10-CM | POA: Diagnosis not present

## 2023-01-27 DIAGNOSIS — F17210 Nicotine dependence, cigarettes, uncomplicated: Secondary | ICD-10-CM | POA: Insufficient documentation

## 2023-01-27 DIAGNOSIS — N186 End stage renal disease: Secondary | ICD-10-CM

## 2023-01-27 DIAGNOSIS — Z992 Dependence on renal dialysis: Secondary | ICD-10-CM | POA: Insufficient documentation

## 2023-01-27 DIAGNOSIS — Z79899 Other long term (current) drug therapy: Secondary | ICD-10-CM | POA: Diagnosis not present

## 2023-01-27 DIAGNOSIS — E8770 Fluid overload, unspecified: Secondary | ICD-10-CM | POA: Diagnosis not present

## 2023-01-27 DIAGNOSIS — E876 Hypokalemia: Secondary | ICD-10-CM | POA: Insufficient documentation

## 2023-01-27 DIAGNOSIS — E8779 Other fluid overload: Principal | ICD-10-CM

## 2023-01-27 DIAGNOSIS — I509 Heart failure, unspecified: Secondary | ICD-10-CM

## 2023-01-27 DIAGNOSIS — I132 Hypertensive heart and chronic kidney disease with heart failure and with stage 5 chronic kidney disease, or end stage renal disease: Secondary | ICD-10-CM | POA: Insufficient documentation

## 2023-01-27 DIAGNOSIS — I517 Cardiomegaly: Secondary | ICD-10-CM | POA: Diagnosis not present

## 2023-01-27 DIAGNOSIS — I5023 Acute on chronic systolic (congestive) heart failure: Secondary | ICD-10-CM | POA: Diagnosis not present

## 2023-01-27 DIAGNOSIS — J9601 Acute respiratory failure with hypoxia: Principal | ICD-10-CM | POA: Diagnosis present

## 2023-01-27 DIAGNOSIS — I5043 Acute on chronic combined systolic (congestive) and diastolic (congestive) heart failure: Secondary | ICD-10-CM | POA: Insufficient documentation

## 2023-01-27 DIAGNOSIS — I10 Essential (primary) hypertension: Secondary | ICD-10-CM | POA: Diagnosis present

## 2023-01-27 DIAGNOSIS — R0602 Shortness of breath: Secondary | ICD-10-CM | POA: Diagnosis not present

## 2023-01-27 DIAGNOSIS — J811 Chronic pulmonary edema: Secondary | ICD-10-CM | POA: Diagnosis not present

## 2023-01-27 LAB — COMPREHENSIVE METABOLIC PANEL
ALT: 12 U/L (ref 0–44)
AST: 16 U/L (ref 15–41)
Albumin: 2.9 g/dL — ABNORMAL LOW (ref 3.5–5.0)
Alkaline Phosphatase: 43 U/L (ref 38–126)
Anion gap: 16 — ABNORMAL HIGH (ref 5–15)
BUN: 70 mg/dL — ABNORMAL HIGH (ref 6–20)
CO2: 21 mmol/L — ABNORMAL LOW (ref 22–32)
Calcium: 7.7 mg/dL — ABNORMAL LOW (ref 8.9–10.3)
Chloride: 99 mmol/L (ref 98–111)
Creatinine, Ser: 9.88 mg/dL — ABNORMAL HIGH (ref 0.61–1.24)
GFR, Estimated: 6 mL/min — ABNORMAL LOW (ref >60)
Glucose, Bld: 88 mg/dL (ref 70–99)
Potassium: 3.8 mmol/L (ref 3.5–5.1)
Sodium: 136 mmol/L (ref 135–145)
Total Bilirubin: 0.5 mg/dL (ref 0.3–1.2)
Total Protein: 6.3 g/dL — ABNORMAL LOW (ref 6.5–8.1)

## 2023-01-27 LAB — CBC WITH DIFFERENTIAL/PLATELET
Abs Immature Granulocytes: 0.01 10*3/uL (ref 0.00–0.07)
Basophils Absolute: 0 10*3/uL (ref 0.0–0.1)
Basophils Relative: 1 %
Eosinophils Absolute: 0.3 10*3/uL (ref 0.0–0.5)
Eosinophils Relative: 6 %
HCT: 26.8 % — ABNORMAL LOW (ref 39.0–52.0)
Hemoglobin: 8.9 g/dL — ABNORMAL LOW (ref 13.0–17.0)
Immature Granulocytes: 0 %
Lymphocytes Relative: 34 %
Lymphs Abs: 1.5 10*3/uL (ref 0.7–4.0)
MCH: 30.3 pg (ref 26.0–34.0)
MCHC: 33.2 g/dL (ref 30.0–36.0)
MCV: 91.2 fL (ref 80.0–100.0)
Monocytes Absolute: 0.5 10*3/uL (ref 0.1–1.0)
Monocytes Relative: 11 %
Neutro Abs: 2.1 10*3/uL (ref 1.7–7.7)
Neutrophils Relative %: 48 %
Platelets: 248 10*3/uL (ref 150–400)
RBC: 2.94 MIL/uL — ABNORMAL LOW (ref 4.22–5.81)
RDW: 14.6 % (ref 11.5–15.5)
WBC: 4.4 10*3/uL (ref 4.0–10.5)
nRBC: 0 % (ref 0.0–0.2)

## 2023-01-27 LAB — BRAIN NATRIURETIC PEPTIDE: B Natriuretic Peptide: 1945 pg/mL — ABNORMAL HIGH (ref 0.0–100.0)

## 2023-01-27 LAB — TROPONIN I (HIGH SENSITIVITY)
Troponin I (High Sensitivity): 40 ng/L — ABNORMAL HIGH (ref <18)
Troponin I (High Sensitivity): 36 ng/L — ABNORMAL HIGH (ref <18)

## 2023-01-27 MED ORDER — ALBUTEROL SULFATE HFA 108 (90 BASE) MCG/ACT IN AERS: 2.0000 | INHALATION_SPRAY | RESPIRATORY_TRACT | Status: DC | PRN

## 2023-01-27 MED ORDER — ISOSORBIDE MONONITRATE ER 30 MG PO TB24
15.0000 mg | ORAL_TABLET | Freq: Every day | ORAL | Status: DC
  Filled 2023-01-27: qty 1

## 2023-01-27 MED ORDER — HEPARIN SODIUM (PORCINE) 5000 UNIT/ML IJ SOLN
5000.0000 [IU] | Freq: Three times a day (TID) | INTRAMUSCULAR | Status: DC
  Filled 2023-01-27 (×2): qty 1

## 2023-01-27 MED ORDER — CALCIUM ACETATE (PHOS BINDER) 667 MG PO CAPS
667.0000 mg | ORAL_CAPSULE | Freq: Three times a day (TID) | ORAL | Status: DC
  Administered 2023-01-28 (×3): 667 mg via ORAL
  Filled 2023-01-27 (×3): qty 1

## 2023-01-27 MED ORDER — HYDRALAZINE HCL 25 MG PO TABS
25.0000 mg | ORAL_TABLET | Freq: Three times a day (TID) | ORAL | Status: DC
  Filled 2023-01-27: qty 1

## 2023-01-27 MED ORDER — ALBUTEROL SULFATE (2.5 MG/3ML) 0.083% IN NEBU: 2.5000 mg | INHALATION_SOLUTION | RESPIRATORY_TRACT | Status: DC | PRN

## 2023-01-27 MED ORDER — ACETAMINOPHEN 650 MG RE SUPP: 650.0000 mg | Freq: Four times a day (QID) | RECTAL | Status: DC | PRN

## 2023-01-27 MED ORDER — TRAZODONE HCL 50 MG PO TABS
100.0000 mg | ORAL_TABLET | Freq: Every day | ORAL | Status: DC
  Administered 2023-01-27: 100 mg via ORAL
  Filled 2023-01-27: qty 2

## 2023-01-27 MED ORDER — CARVEDILOL 3.125 MG PO TABS
6.2500 mg | ORAL_TABLET | Freq: Two times a day (BID) | ORAL | Status: DC
  Filled 2023-01-27: qty 2

## 2023-01-27 MED ORDER — CHLORHEXIDINE GLUCONATE CLOTH 2 % EX PADS
6.0000 | MEDICATED_PAD | Freq: Every day | CUTANEOUS | Status: DC
  Administered 2023-01-28: 6 via TOPICAL

## 2023-01-27 MED ORDER — ONDANSETRON HCL 4 MG PO TABS: 4.0000 mg | ORAL_TABLET | Freq: Four times a day (QID) | ORAL | Status: DC | PRN

## 2023-01-27 MED ORDER — ONDANSETRON HCL 4 MG/2ML IJ SOLN: 4.0000 mg | Freq: Four times a day (QID) | INTRAMUSCULAR | Status: DC | PRN

## 2023-01-27 MED ORDER — ACETAMINOPHEN 325 MG PO TABS: 650.0000 mg | ORAL_TABLET | Freq: Four times a day (QID) | ORAL | Status: DC | PRN

## 2023-01-27 MED ORDER — AMLODIPINE BESYLATE 5 MG PO TABS
10.0000 mg | ORAL_TABLET | Freq: Every day | ORAL | Status: DC
  Filled 2023-01-27: qty 2

## 2023-01-27 MED ORDER — OXYCODONE HCL 5 MG PO TABS: 5.0000 mg | ORAL_TABLET | ORAL | Status: DC | PRN

## 2023-01-27 NOTE — ED Triage Notes (Signed)
Pt with c/o SOB since last night. States he cannot breathe when lying down. Pt also c/o swelling to bilateral lower extremities "since dialysis yesterday".

## 2023-01-27 NOTE — ED Notes (Signed)
..ED TO INPATIENT HANDOFF REPORT  ED Nurse Name and Phone #: Scheryl Darter, RN [phone]  S Name/Age/Gender Damon Williams 62 y.o. male Room/Bed: APA11/APA11  Code Status   Code Status: Full Code  Home/SNF/Other Home Patient oriented to: self, place, time, and situation Is this baseline? Yes   Triage Complete: Triage complete  Chief Complaint Acute respiratory failure with hypoxia (HCC) [J96.01]  Triage Note Pt with c/o SOB since last night. States he cannot breathe when lying down. Pt also c/o swelling to bilateral lower extremities "since dialysis yesterday".    Allergies No Known Allergies  Level of Care/Admitting Diagnosis ED Disposition     ED Disposition  Admit   Condition  --   Comment  Hospital Area: [facility] [[id number]]  Level of Care: Telemetry [5]  Covid Evaluation: Asymptomatic - no recent exposure (last 10 days) testing not required  Diagnosis: Acute respiratory failure with hypoxia [facility]) [[id number]]  Admitting Physician: Lilyan Gilford [[id number]]  Attending Physician: Lilyan Gilford [[id number]]          B Medical/Surgery History Past Medical History:  Diagnosis Date   Cardiomyopathy (HCC)    CKD    Cocaine use    ESRD (end stage renal disease) on dialysis (HCC) 12/05/2022   Hypertension    Tobacco abuse    Past Surgical History:  Procedure Laterality Date   AV FISTULA PLACEMENT Left 12/27/2022   Procedure: INSERTION OF LEFT ARM  ARTERIOVENOUS FISTULA;  Surgeon: Larina Earthly, MD;  Location: [facility];  Service: Vascular;  Laterality: Left;   BACK SURGERY     IR FLUORO GUIDE CV LINE RIGHT  12/07/2022   IR US GUIDE VASC ACCESS RIGHT  12/07/2022     A IV Location/Drains/Wounds Patient Lines/Drains/Airways Status     Active Line/Drains/Airways     Name Placement date Placement time Site Days   Peripheral IV 01/27/23 20 G Anterior;Right;Upper Arm 01/27/23  2042  Arm  less than 1   Fistula / Graft Left Upper arm  Arteriovenous fistula 12/27/22  0835  Upper arm  31   Hemodialysis Catheter Right Internal jugular Double lumen Permanent (Tunneled) 12/07/22  0943  Internal jugular  51            Intake/Output Last 24 hours No intake or output data in the 24 hours ending 01/27/23 2227  Labs/Imaging Results for orders placed or performed during the hospital encounter of 01/27/23 (from the past 48 hour(s))  Comprehensive metabolic panel     Status: Abnormal   Collection Time: 01/27/23  8:10 PM  Result Value Ref Range   Sodium 136 135 - 145 mmol/L   Potassium 3.8 3.5 - 5.1 mmol/L   Chloride 99 98 - 111 mmol/L   CO2 21 (L) 22 - 32 mmol/L   Glucose, Bld 88 70 - 99 mg/dL    Comment: Glucose reference range applies only to samples taken after fasting for at least 8 hours.   BUN 70 (H) 6 - 20 mg/dL   Creatinine, Ser 0.86 (H) 0.61 - 1.24 mg/dL   Calcium 7.7 (L) 8.9 - 10.3 mg/dL   Total Protein 6.3 (L) 6.5 - 8.1 g/dL   Albumin 2.9 (L) 3.5 - 5.0 g/dL   AST 16 15 - 41 U/L   ALT 12 0 - 44 U/L   Alkaline Phosphatase 43 38 - 126 U/L   Total Bilirubin 0.5 0.3 - 1.2 mg/dL   GFR, Estimated 6 (L) >60 mL/min  Comment: (NOTE) Calculated using the CKD-EPI Creatinine Equation (2021)    Anion gap 16 (H) 5 - 15    Comment: Performed at [facility], [address] [place], [place] [address]  Troponin I (High Sensitivity)     Status: Abnormal   Collection Time: 01/27/23  8:10 PM  Result Value Ref Range   Troponin I (High Sensitivity) 40 (H) <18 ng/L    Comment: (NOTE) Elevated high sensitivity troponin I (hsTnI) values and significant  changes across serial measurements may suggest ACS but many other  chronic and acute conditions are known to elevate hsTnI results.  Refer to the "Links" section for chest pain algorithms and additional  guidance. Performed at [facility], [address] [place], [place] [address]   CBC with Differential     Status: Abnormal   Collection Time: 01/27/23  8:10 PM  Result  Value Ref Range   WBC 4.4 4.0 - 10.5 K/uL   RBC 2.94 (L) 4.22 - 5.81 MIL/uL   Hemoglobin 8.9 (L) 13.0 - 17.0 g/dL   HCT 82.9 (L) 56.2 - 13.0 %   MCV 91.2 80.0 - 100.0 fL   MCH 30.3 26.0 - 34.0 pg   MCHC 33.2 30.0 - 36.0 g/dL   RDW 86.5 78.4 - 69.6 %   Platelets 248 150 - 400 K/uL   nRBC 0.0 0.0 - 0.2 %   Neutrophils Relative % 48 %   Neutro Abs 2.1 1.7 - 7.7 K/uL   Lymphocytes Relative 34 %   Lymphs Abs 1.5 0.7 - 4.0 K/uL   Monocytes Relative 11 %   Monocytes Absolute 0.5 0.1 - 1.0 K/uL   Eosinophils Relative 6 %   Eosinophils Absolute 0.3 0.0 - 0.5 K/uL   Basophils Relative 1 %   Basophils Absolute 0.0 0.0 - 0.1 K/uL   Immature Granulocytes 0 %   Abs Immature Granulocytes 0.01 0.00 - 0.07 K/uL    Comment: Performed at [facility], [address], [place], [place] [address]  Brain natriuretic peptide     Status: Abnormal   Collection Time: 01/27/23  8:10 PM  Result Value Ref Range   B Natriuretic Peptide 1,945.0 (H) 0.0 - 100.0 pg/mL    Comment: Performed at [facility], [address] [place], [place] [address]  Troponin I (High Sensitivity)     Status: Abnormal   Collection Time: 01/27/23  9:29 PM  Result Value Ref Range   Troponin I (High Sensitivity) 36 (H) <18 ng/L    Comment: (NOTE) Elevated high sensitivity troponin I (hsTnI) values and significant  changes across serial measurements may suggest ACS but many other  chronic and acute conditions are known to elevate hsTnI results.  Refer to the "Links" section for chest pain algorithms and additional  guidance. Performed at [facility], [address] [place], [place] [address]    DG Chest Portable 1 View  Result Date: 01/27/2023 CLINICAL DATA:  Shortness of breath. EXAM: PORTABLE CHEST 1 VIEW COMPARISON:  Chest radiograph dated 12/04/2022. FINDINGS: Dialysis catheter with tip at the cavoatrial junction. There is cardiomegaly with vascular congestion and edema as well as small bilateral pleural effusions, new since  the prior radiograph. No pneumothorax. No acute osseous pathology. IMPRESSION: Cardiomegaly with vascular congestion and edema. Electronically Signed   By: Elgie Collard M.D.   On: 01/27/2023 21:04    Pending Labs Unresulted Labs (From admission, onward)     Start     Ordered   01/27/23 2144  Hepatitis  B surface antigen  (New Admission Hemo Labs (Hepatitis B))  Once,   URGENT        01/27/23 2144   01/27/23 2144  Hepatitis B surface antibody,quantitative  (New Admission Hemo Labs (Hepatitis B))  Once,   URGENT        01/27/23 2144   Signed and Held  Renal function panel  Once,   R        Signed and Held   Signed and Held  CBC  Once,   R        Signed and Held   Signed and Held  Comprehensive metabolic panel  Tomorrow morning,   R        Signed and Held   Signed and Held  Magnesium  Tomorrow morning,   R        Signed and Held   Signed and Held  CBC with Differential/Platelet  Tomorrow morning,   R        Signed and Held   Signed and Held  Phosphorus  Tomorrow morning,   R        Signed and Held            Vitals/Pain Today's Vitals   01/27/23 1944 01/27/23 1945 01/27/23 1948 01/27/23 2223  BP:   (!) 165/99 (!) 165/92  Pulse:  97  95  Resp:  18  (!) 22  Temp:  98 F (36.7 C)  (!) 97.5 F (36.4 C)  TempSrc:  Oral  Oral  SpO2:  93%  92%  Weight: 154 lb (69.9 kg)     Height: 5' 7.5" (1.715 m)     PainSc: 0-No pain       Isolation Precautions No active isolations  Medications Medications  albuterol (VENTOLIN HFA) 108 (90 Base) MCG/ACT inhaler 2 puff (has no administration in time range)  Chlorhexidine Gluconate Cloth 2 % PADS 6 each (has no administration in time range)    Mobility walks     Focused Assessments Renal Assessment Handoff:  Hemodialysis Schedule: Hemodialysis Schedule: Tuesday/Thursday/Saturday Last Hemodialysis date and time: Thursday 01/27/23   Restricted appendage: left arm   R Recommendations: See Admitting Provider Note  Report  given to:   Additional Notes:

## 2023-01-28 DIAGNOSIS — I120 Hypertensive chronic kidney disease with stage 5 chronic kidney disease or end stage renal disease: Secondary | ICD-10-CM | POA: Diagnosis not present

## 2023-01-28 DIAGNOSIS — N186 End stage renal disease: Secondary | ICD-10-CM | POA: Diagnosis not present

## 2023-01-28 DIAGNOSIS — D631 Anemia in chronic kidney disease: Secondary | ICD-10-CM | POA: Diagnosis not present

## 2023-01-28 DIAGNOSIS — I5023 Acute on chronic systolic (congestive) heart failure: Secondary | ICD-10-CM | POA: Diagnosis not present

## 2023-01-28 DIAGNOSIS — I509 Heart failure, unspecified: Secondary | ICD-10-CM

## 2023-01-28 DIAGNOSIS — Z992 Dependence on renal dialysis: Secondary | ICD-10-CM

## 2023-01-28 DIAGNOSIS — N250 Renal osteodystrophy: Secondary | ICD-10-CM | POA: Diagnosis not present

## 2023-01-28 DIAGNOSIS — R0602 Shortness of breath: Secondary | ICD-10-CM | POA: Diagnosis not present

## 2023-01-28 DIAGNOSIS — J9601 Acute respiratory failure with hypoxia: Secondary | ICD-10-CM | POA: Diagnosis not present

## 2023-01-28 DIAGNOSIS — I10 Essential (primary) hypertension: Secondary | ICD-10-CM

## 2023-01-28 DIAGNOSIS — I5043 Acute on chronic combined systolic (congestive) and diastolic (congestive) heart failure: Secondary | ICD-10-CM | POA: Insufficient documentation

## 2023-01-28 DIAGNOSIS — J810 Acute pulmonary edema: Secondary | ICD-10-CM | POA: Diagnosis not present

## 2023-01-28 LAB — CBC WITH DIFFERENTIAL/PLATELET
Abs Immature Granulocytes: 0.01 10*3/uL (ref 0.00–0.07)
Basophils Absolute: 0 10*3/uL (ref 0.0–0.1)
Basophils Relative: 0 %
Eosinophils Absolute: 0.1 10*3/uL (ref 0.0–0.5)
Eosinophils Relative: 2 %
HCT: 24.9 % — ABNORMAL LOW (ref 39.0–52.0)
Hemoglobin: 8.3 g/dL — ABNORMAL LOW (ref 13.0–17.0)
Immature Granulocytes: 0 %
Lymphocytes Relative: 19 %
Lymphs Abs: 0.9 10*3/uL (ref 0.7–4.0)
MCH: 30.5 pg (ref 26.0–34.0)
MCHC: 33.3 g/dL (ref 30.0–36.0)
MCV: 91.5 fL (ref 80.0–100.0)
Monocytes Absolute: 0.3 10*3/uL (ref 0.1–1.0)
Monocytes Relative: 7 %
Neutro Abs: 3.2 10*3/uL (ref 1.7–7.7)
Neutrophils Relative %: 72 %
Platelets: 222 10*3/uL (ref 150–400)
RBC: 2.72 MIL/uL — ABNORMAL LOW (ref 4.22–5.81)
RDW: 14.6 % (ref 11.5–15.5)
WBC: 4.5 10*3/uL (ref 4.0–10.5)
nRBC: 0 % (ref 0.0–0.2)

## 2023-01-28 LAB — COMPREHENSIVE METABOLIC PANEL
ALT: 11 U/L (ref 0–44)
AST: 13 U/L — ABNORMAL LOW (ref 15–41)
Albumin: 2.6 g/dL — ABNORMAL LOW (ref 3.5–5.0)
Alkaline Phosphatase: 37 U/L — ABNORMAL LOW (ref 38–126)
Anion gap: 14 (ref 5–15)
BUN: 76 mg/dL — ABNORMAL HIGH (ref 6–20)
CO2: 21 mmol/L — ABNORMAL LOW (ref 22–32)
Calcium: 7.7 mg/dL — ABNORMAL LOW (ref 8.9–10.3)
Chloride: 103 mmol/L (ref 98–111)
Creatinine, Ser: 10.6 mg/dL — ABNORMAL HIGH (ref 0.61–1.24)
GFR, Estimated: 5 mL/min — ABNORMAL LOW (ref >60)
Glucose, Bld: 85 mg/dL (ref 70–99)
Potassium: 4 mmol/L (ref 3.5–5.1)
Sodium: 138 mmol/L (ref 135–145)
Total Bilirubin: 0.7 mg/dL (ref 0.3–1.2)
Total Protein: 5.6 g/dL — ABNORMAL LOW (ref 6.5–8.1)

## 2023-01-28 LAB — PHOSPHORUS: Phosphorus: 7.6 mg/dL — ABNORMAL HIGH (ref 2.5–4.6)

## 2023-01-28 LAB — MAGNESIUM: Magnesium: 2 mg/dL (ref 1.7–2.4)

## 2023-01-28 LAB — HEPATITIS B SURFACE ANTIGEN: Hepatitis B Surface Ag: NONREACTIVE

## 2023-01-28 MED ORDER — ANTICOAGULANT SODIUM CITRATE 4% (200MG/5ML) IV SOLN: 5.0000 mL | Status: DC | PRN

## 2023-01-28 MED ORDER — LABETALOL HCL 5 MG/ML IV SOLN: 10.0000 mg | INTRAVENOUS | Status: DC | PRN

## 2023-01-28 MED ORDER — HEPARIN SODIUM (PORCINE) 1000 UNIT/ML DIALYSIS: 1000.0000 [IU] | INTRAMUSCULAR | Status: DC | PRN

## 2023-01-28 MED ORDER — HEPARIN SODIUM (PORCINE) 1000 UNIT/ML IJ SOLN
INTRAMUSCULAR | Status: AC
  Filled 2023-01-28: qty 2

## 2023-01-28 MED ORDER — ALTEPLASE 2 MG IJ SOLR: 2.0000 mg | Freq: Once | INTRAMUSCULAR | Status: DC | PRN

## 2023-01-28 MED ORDER — HEPARIN SODIUM (PORCINE) 1000 UNIT/ML DIALYSIS: 20.0000 [IU]/kg | INTRAMUSCULAR | Status: DC | PRN

## 2023-01-28 NOTE — Assessment & Plan Note (Signed)
-   Continue Norco, Coreg, hydralazine - BP in the ED 165/99 - Asymptomatic, not meeting criteria for hypertensive crisis - Will add as needed antihypertensive if indicated - Likely will improve with volume control - Continue to monitor

## 2023-01-28 NOTE — Discharge Summary (Signed)
Physician Discharge Summary  Damon Williams ZOX:[id number] DOB: 1960/12/04 DOA: 01/27/2023  PCP: Kara Pacer, NP  Admit date: 01/27/2023 Discharge date: 01/28/2023  Admitted From:  HOME  Disposition:  HOME   Recommendations for Outpatient Follow-up:  Resume regular outpatient hemodialysis schedule   Discharge Condition: STABLE   CODE STATUS: FULL DIET: renal / with fluid restriction   Brief Hospitalization Summary: Please see all hospital notes, images, labs for full details of the hospitalization. Admission provider HPI:  62 y.o. male with medical history significant of cardiomyopathy, history of cocaine use,, ESRD on HD, hypertension, former smoker, former drinker, presents to the ER with a chief complaint of dyspnea.  Patient reports his dyspnea started yesterday.  It has been constant but not worse since it started.  He does not have an oxygen requirement at baseline.  He was coughing yesterday but it was nonproductive.  He denies any fever, chest pain, dizziness.  Patient reports his dyspnea is worse on exertion and with laying flat.  Patient reports palpitations.  He has had dyspnea like this before but is not sure what it was from.  Patient is on HD.  He reports he has been going and has not missed any sessions.  His schedule is Tuesday, Thursday, Saturday.  Patient has been cutting his session short to 3 hours instead of 4 hours due to obligations to be home to take care of his parents.  He reports he had peripheral edema for a couple of weeks.  He has very minimal urine output and that is normal for him.  Patient denies any dysuria or hematuria.  Patient has no other complaints at this time.   Patient is a former smoker, former drinker.  He has not been vaccinated for COVID.  Patient is full code.  Hospital Course Pt was admitted with volume overload presumably from not completing serial dialysis treatments, stopping after about 3 hours.  He presented with signs and  symptoms of volume overload.  He was noted to have elevated blood pressures and had symptoms and findings of acute respiratory failure with hypoxia requiring 2L [place] oxygen delivery.  He was seen by nephrologist Dr. Ronalee Belts who recommended inpatient hemodialysis treatment for volume removal. Pt has been eager to go home even prior to receiving HD.  He says he will try to complete full 4 hours of dialysis treatment going forward.  Nephrology said that he could DC after HD completed and he is discharged home in stable condition to resume his regular outpatient dialysis treatment schedule.    Discharge Diagnoses:  Principal Problem:   Acute respiratory failure with hypoxia (HCC) Active Problems:   Uncontrolled hypertension   ESRD on hemodialysis (HCC)   CHF exacerbation (HCC)   Acute on chronic systolic CHF (congestive heart failure) (HCC)   Discharge Instructions:  Allergies as of 01/28/2023   No Known Allergies      Medication List     TAKE these medications    amLODipine 10 MG tablet Commonly known as: NORVASC Take 1 tablet (10 mg total) by mouth daily.   carvedilol 6.25 MG tablet Commonly known as: COREG Take 1 tablet (6.25 mg total) by mouth 2 (two) times daily with a meal.   hydrALAZINE 25 MG tablet Commonly known as: APRESOLINE Take 1 tablet (25 mg total) by mouth 3 (three) times daily.   isosorbide mononitrate 30 MG 24 hr tablet Commonly known as: IMDUR Take 0.5 tablets (15 mg total) by mouth daily.   PhosLo 667  MG capsule Generic drug: calcium acetate Take 667 mg by mouth 3 (three) times daily with meals.   traZODone 100 MG tablet Commonly known as: DESYREL Take 100 mg by mouth at bedtime.        Follow-up Information     Nsumanganyi, Colleen Can, NP Follow up.   Contact information: [address] [place] [address] [phone]         Hemodialysis Follow up.   Why: As scheduled               No Known  Allergies Allergies as of 01/28/2023   No Known Allergies      Medication List     TAKE these medications    amLODipine 10 MG tablet Commonly known as: NORVASC Take 1 tablet (10 mg total) by mouth daily.   carvedilol 6.25 MG tablet Commonly known as: COREG Take 1 tablet (6.25 mg total) by mouth 2 (two) times daily with a meal.   hydrALAZINE 25 MG tablet Commonly known as: APRESOLINE Take 1 tablet (25 mg total) by mouth 3 (three) times daily.   isosorbide mononitrate 30 MG 24 hr tablet Commonly known as: IMDUR Take 0.5 tablets (15 mg total) by mouth daily.   PhosLo 667 MG capsule Generic drug: calcium acetate Take 667 mg by mouth 3 (three) times daily with meals.   traZODone 100 MG tablet Commonly known as: DESYREL Take 100 mg by mouth at bedtime.        Procedures/Studies: DG Chest Portable 1 View  Result Date: 01/27/2023 CLINICAL DATA:  Shortness of breath. EXAM: PORTABLE CHEST 1 VIEW COMPARISON:  Chest radiograph dated 12/04/2022. FINDINGS: Dialysis catheter with tip at the cavoatrial junction. There is cardiomegaly with vascular congestion and edema as well as small bilateral pleural effusions, new since the prior radiograph. No pneumothorax. No acute osseous pathology. IMPRESSION: Cardiomegaly with vascular congestion and edema. Electronically Signed   By: Elgie Collard M.D.   On: 01/27/2023 21:04     Subjective: Pt is very eager to go home.  He says he will leave after his HD treatment today regardless.   Discharge Exam: Vitals:   01/28/23 0928 01/28/23 1326  BP: (!) 163/100 (!) 159/102  Pulse: 88 97  Resp: 20 20  Temp: 98.2 F (36.8 C) 97.8 F (36.6 C)  SpO2: 92% 91%   Vitals:   01/28/23 0446 01/28/23 0817 01/28/23 0928 01/28/23 1326  BP: (!) 166/93  (!) 163/100 (!) 159/102  Pulse: 91  88 97  Resp:   20 20  Temp: 97.8 F (36.6 C)  98.2 F (36.8 C) 97.8 F (36.6 C)  TempSrc: Oral  Oral Oral  SpO2: 91%  92% 91%  Weight: 70.5 kg 69.9 kg     Height:       General: Pt is alert, awake, not in acute distress Cardiovascular: normal S1/S2 +, no rubs, no gallops Respiratory: no increased work of breathing Abdominal: Soft, NT, ND, bowel sounds + Extremities: no edema, no cyanosis   The results of significant diagnostics from this hospitalization (including imaging, microbiology, ancillary and laboratory) are listed below for reference.     Microbiology: No results found for this or any previous visit (from the past 240 hour(s)).   Labs: BNP (last 3 results) Recent Labs    11/05/22 0356 12/04/22 1800 01/27/23 2010  BNP 564.0* 1,433.0* 1,945.0*   Basic Metabolic Panel: Recent Labs  Lab 01/27/23 2010 01/28/23 0410  NA 136 138  K 3.8  4.0  CL 99 103  CO2 21* 21*  GLUCOSE 88 85  BUN 70* 76*  CREATININE 9.88* 10.60*  CALCIUM 7.7* 7.7*  MG  --  2.0  PHOS  --  7.6*   Liver Function Tests: Recent Labs  Lab 01/27/23 2010 01/28/23 0410  AST 16 13*  ALT 12 11  ALKPHOS 43 37*  BILITOT 0.5 0.7  PROT 6.3* 5.6*  ALBUMIN 2.9* 2.6*   No results for input(s): "LIPASE", "AMYLASE" in the last 168 hours. No results for input(s): "AMMONIA" in the last 168 hours. CBC: Recent Labs  Lab 01/27/23 2010 01/28/23 0410  WBC 4.4 4.5  NEUTROABS 2.1 3.2  HGB 8.9* 8.3*  HCT 26.8* 24.9*  MCV 91.2 91.5  PLT 248 222   Cardiac Enzymes: No results for input(s): "CKTOTAL", "CKMB", "CKMBINDEX", "TROPONINI" in the last 168 hours. BNP: Invalid input(s): "POCBNP" CBG: No results for input(s): "GLUCAP" in the last 168 hours. D-Dimer No results for input(s): "DDIMER" in the last 72 hours. Hgb A1c No results for input(s): "HGBA1C" in the last 72 hours. Lipid Profile No results for input(s): "CHOL", "HDL", "LDLCALC", "TRIG", "CHOLHDL", "LDLDIRECT" in the last 72 hours. Thyroid function studies No results for input(s): "TSH", "T4TOTAL", "T3FREE", "THYROIDAB" in the last 72 hours.  Invalid input(s): "FREET3" Anemia work up No  results for input(s): "VITAMINB12", "FOLATE", "FERRITIN", "TIBC", "IRON", "RETICCTPCT" in the last 72 hours. Urinalysis    Component Value Date/Time   COLORURINE STRAW (A) 11/04/2022 1226   APPEARANCEUR CLEAR 11/04/2022 1226   LABSPEC 1.011 11/04/2022 1226   PHURINE 5.0 11/04/2022 1226   GLUCOSEU NEGATIVE 11/04/2022 1226   HGBUR SMALL (A) 11/04/2022 1226   BILIRUBINUR NEGATIVE 11/04/2022 1226   KETONESUR NEGATIVE 11/04/2022 1226   PROTEINUR 100 (A) 11/04/2022 1226   NITRITE NEGATIVE 11/04/2022 1226   LEUKOCYTESUR NEGATIVE 11/04/2022 1226   Sepsis Labs Recent Labs  Lab 01/27/23 2010 01/28/23 0410  WBC 4.4 4.5   Microbiology No results found for this or any previous visit (from the past 240 hour(s)).  Time coordinating discharge:   SIGNED:  Standley Dakins, MD  [facility] 01/28/2023, 3:25 PM How to contact the [facility] Attending or Consulting provider 7A - 7P or covering provider during after hours 7P -7A, for this patient?  Check the care team in [facility] and look for a) attending/consulting [facility] provider listed and b) the [facility] team listed Log into [email or web address] and use [place]'s universal password to access. If you do not have the password, please contact the hospital operator. Locate the [facility] provider you are looking for under [facility] and page to a number that you can be directly reached. If you still have difficulty reaching the provider, please page the [facility] (Director on Call) for the Hospitalists listed on amion for assistance.

## 2023-01-28 NOTE — Consult Note (Addendum)
[facility] Nephrology Consult Note: Reason for Consult: To manage dialysis and dialysis related needs Referring Physician: Dr. Laural Benes, [place]  HPI:  Damon Williams is an 62 y.o. male with past medical history significant for hypertension, tobacco use, cardiomyopathy, ESRD on HD who presented with shortness of breath and bilateral lower extremity edema, seen as a consultation for the management surrounding dialysis. The patient said he goes to [facility] in [place] TTS schedule and had completed treatment on Thursday.  After the dialysis he noticed leg swelling and short of breath.  In the ER, he initially required about 2 L of oxygen.  Chest x-ray with cardiomegaly with vascular congestion and edema.  The labs showed potassium 4.0, BNP 1945. After lying on the bed in the hospital the leg swelling has completely gone.  He reports that he does not have chest pain or shortness of breath today.  He is eager to get dialysis today and then go home. He has tunneled HD catheter and had left brachiocephalic AV fistula created by Dr. Arbie Cookey on 12/27/2022.  The fistula is maturing well.  Past Medical History:  Diagnosis Date   Cardiomyopathy (HCC)    CKD    Cocaine use    ESRD (end stage renal disease) on dialysis (HCC) 12/05/2022   Hypertension    Tobacco abuse     Past Surgical History:  Procedure Laterality Date   AV FISTULA PLACEMENT Left 12/27/2022   Procedure: INSERTION OF LEFT ARM  ARTERIOVENOUS FISTULA;  Surgeon: Larina Earthly, MD;  Location: [facility] ORS;  Service: Vascular;  Laterality: Left;   BACK SURGERY     IR FLUORO GUIDE CV LINE RIGHT  12/07/2022   IR US GUIDE VASC ACCESS RIGHT  12/07/2022    Family History  Problem Relation Age of Onset   Heart disease Neg Hx     Social History:  reports that he has been smoking cigarettes. He has a 30 pack-year smoking history. He quit smokeless tobacco use about 4 months ago. He reports that he does not currently use alcohol. He  reports current drug use. Drug: Codeine.  Allergies: No Known Allergies  Medications: I have reviewed the patient's current medications.   Results for orders placed or performed during the hospital encounter of 01/27/23 (from the past 48 hour(s))  Comprehensive metabolic panel     Status: Abnormal   Collection Time: 01/27/23  8:10 PM  Result Value Ref Range   Sodium 136 135 - 145 mmol/L   Potassium 3.8 3.5 - 5.1 mmol/L   Chloride 99 98 - 111 mmol/L   CO2 21 (L) 22 - 32 mmol/L   Glucose, Bld 88 70 - 99 mg/dL    Comment: Glucose reference range applies only to samples taken after fasting for at least 8 hours.   BUN 70 (H) 6 - 20 mg/dL   Creatinine, Ser 5.36 (H) 0.61 - 1.24 mg/dL   Calcium 7.7 (L) 8.9 - 10.3 mg/dL   Total Protein 6.3 (L) 6.5 - 8.1 g/dL   Albumin 2.9 (L) 3.5 - 5.0 g/dL   AST 16 15 - 41 U/L   ALT 12 0 - 44 U/L   Alkaline Phosphatase 43 38 - 126 U/L   Total Bilirubin 0.5 0.3 - 1.2 mg/dL   GFR, Estimated 6 (L) >60 mL/min    Comment: (NOTE) Calculated using the CKD-EPI Creatinine Equation (2021)    Anion gap 16 (H) 5 - 15    Comment: Performed at [facility], [address], [place], [place] [address]  Troponin I (High Sensitivity)     Status: Abnormal   Collection Time: 01/27/23  8:10 PM  Result Value Ref Range   Troponin I (High Sensitivity) 40 (H) <18 ng/L    Comment: (NOTE) Elevated high sensitivity troponin I (hsTnI) values and significant  changes across serial measurements may suggest ACS but many other  chronic and acute conditions are known to elevate hsTnI results.  Refer to the "Links" section for chest pain algorithms and additional  guidance. Performed at [facility], [address] [place], [place] [address]   CBC with Differential     Status: Abnormal   Collection Time: 01/27/23  8:10 PM  Result Value Ref Range   WBC 4.4 4.0 - 10.5 K/uL   RBC 2.94 (L) 4.22 - 5.81 MIL/uL   Hemoglobin 8.9 (L) 13.0 - 17.0 g/dL   HCT 95.6 (L) 21.3 - 08.6 %   MCV  91.2 80.0 - 100.0 fL   MCH 30.3 26.0 - 34.0 pg   MCHC 33.2 30.0 - 36.0 g/dL   RDW 57.8 46.9 - 62.9 %   Platelets 248 150 - 400 K/uL   nRBC 0.0 0.0 - 0.2 %   Neutrophils Relative % 48 %   Neutro Abs 2.1 1.7 - 7.7 K/uL   Lymphocytes Relative 34 %   Lymphs Abs 1.5 0.7 - 4.0 K/uL   Monocytes Relative 11 %   Monocytes Absolute 0.5 0.1 - 1.0 K/uL   Eosinophils Relative 6 %   Eosinophils Absolute 0.3 0.0 - 0.5 K/uL   Basophils Relative 1 %   Basophils Absolute 0.0 0.0 - 0.1 K/uL   Immature Granulocytes 0 %   Abs Immature Granulocytes 0.01 0.00 - 0.07 K/uL    Comment: Performed at [facility], [address] [place], [place] [address]  Brain natriuretic peptide     Status: Abnormal   Collection Time: 01/27/23  8:10 PM  Result Value Ref Range   B Natriuretic Peptide 1,945.0 (H) 0.0 - 100.0 pg/mL    Comment: Performed at [facility], [address], [place], [place] [address]  Troponin I (High Sensitivity)     Status: Abnormal   Collection Time: 01/27/23  9:29 PM  Result Value Ref Range   Troponin I (High Sensitivity) 36 (H) <18 ng/L    Comment: (NOTE) Elevated high sensitivity troponin I (hsTnI) values and significant  changes across serial measurements may suggest ACS but many other  chronic and acute conditions are known to elevate hsTnI results.  Refer to the "Links" section for chest pain algorithms and additional  guidance. Performed at [facility], [address] [place], [place] [address]   Comprehensive metabolic panel     Status: Abnormal   Collection Time: 01/28/23  4:10 AM  Result Value Ref Range   Sodium 138 135 - 145 mmol/L   Potassium 4.0 3.5 - 5.1 mmol/L   Chloride 103 98 - 111 mmol/L   CO2 21 (L) 22 - 32 mmol/L   Glucose, Bld 85 70 - 99 mg/dL    Comment: Glucose reference range applies only to samples taken after fasting for at least 8 hours.   BUN 76 (H) 6 - 20 mg/dL   Creatinine, Ser 53.66 (H) 0.61 - 1.24 mg/dL   Calcium 7.7 (L) 8.9 - 10.3 mg/dL   Total  Protein 5.6 (L) 6.5 - 8.1 g/dL   Albumin 2.6 (L) 3.5 - 5.0 g/dL   AST 13 (L) 15 - 41  U/L   ALT 11 0 - 44 U/L   Alkaline Phosphatase 37 (L) 38 - 126 U/L   Total Bilirubin 0.7 0.3 - 1.2 mg/dL   GFR, Estimated 5 (L) >60 mL/min    Comment: (NOTE) Calculated using the CKD-EPI Creatinine Equation (2021)    Anion gap 14 5 - 15    Comment: Performed at [facility], [address] [place], [place] [address]  Magnesium     Status: None   Collection Time: 01/28/23  4:10 AM  Result Value Ref Range   Magnesium 2.0 1.7 - 2.4 mg/dL    Comment: Performed at [facility], [address], [place], [place] [address]  CBC with Differential/Platelet     Status: Abnormal   Collection Time: 01/28/23  4:10 AM  Result Value Ref Range   WBC 4.5 4.0 - 10.5 K/uL   RBC 2.72 (L) 4.22 - 5.81 MIL/uL   Hemoglobin 8.3 (L) 13.0 - 17.0 g/dL   HCT 09.8 (L) 11.9 - 14.7 %   MCV 91.5 80.0 - 100.0 fL   MCH 30.5 26.0 - 34.0 pg   MCHC 33.3 30.0 - 36.0 g/dL   RDW 82.9 56.2 - 13.0 %   Platelets 222 150 - 400 K/uL   nRBC 0.0 0.0 - 0.2 %   Neutrophils Relative % 72 %   Neutro Abs 3.2 1.7 - 7.7 K/uL   Lymphocytes Relative 19 %   Lymphs Abs 0.9 0.7 - 4.0 K/uL   Monocytes Relative 7 %   Monocytes Absolute 0.3 0.1 - 1.0 K/uL   Eosinophils Relative 2 %   Eosinophils Absolute 0.1 0.0 - 0.5 K/uL   Basophils Relative 0 %   Basophils Absolute 0.0 0.0 - 0.1 K/uL   Immature Granulocytes 0 %   Abs Immature Granulocytes 0.01 0.00 - 0.07 K/uL    Comment: Performed at [facility], [address] [place], [place] [address]  Phosphorus     Status: Abnormal   Collection Time: 01/28/23  4:10 AM  Result Value Ref Range   Phosphorus 7.6 (H) 2.5 - 4.6 mg/dL    Comment: Performed at [facility], [address], [place], [place] [address]    DG Chest Portable 1 View  Result Date: 01/27/2023 CLINICAL DATA:  Shortness of breath. EXAM: PORTABLE CHEST 1 VIEW COMPARISON:  Chest radiograph dated 12/04/2022. FINDINGS: Dialysis catheter with  tip at the cavoatrial junction. There is cardiomegaly with vascular congestion and edema as well as small bilateral pleural effusions, new since the prior radiograph. No pneumothorax. No acute osseous pathology. IMPRESSION: Cardiomegaly with vascular congestion and edema. Electronically Signed   By: Elgie Collard M.D.   On: 01/27/2023 21:04    ROS: As per H&P, rest of the systems reviewed and negative. Blood pressure (!) 163/100, pulse 88, temperature 98.2 F (36.8 C), temperature source Oral, resp. rate 20, height 5' 7.5" (1.715 m), weight 69.9 kg, SpO2 92%. Gen: NAD, comfortable Respiratory: Clear bilateral, no wheezing or crackle Cardiovascular: Regular rate rhythm S1-S2 normal, no rubs GI: Abdomen soft, nontender, nondistended Extremities, no cyanosis or clubbing, no edema Skin: No rash or ulcer Neurology: Alert, awake, following commands, oriented Dialysis Access: Right IJ TDC in place, left upper extremity AV fistula has good thrill and bruit.  Assessment/Plan:  # Leg swelling and acute pulmonary edema: He he denies shortness of breath today and it seems like the swelling has improved after lying on the bed overnight.  Noted BNP is elevated.  UF with HD.  #  ESRD: TTS schedule.  Plan for dialysis today as per his regular schedule.  Hopefully he can go home after HD.  UF as tolerated.  He has TDC and AV fistula is maturing well.  # Hypertension: Resume home medication and UF with HD.  # Anemia of ESRD: ESA management as outpatient.  # Metabolic Bone Disease: Monitor calcium phosphorus level and adjust medication.  Ok to discharge home after dialysis today. Thank you for the consult. Discussed with HD nurse.  Malaiah Viramontes Jaynie Collins 01/28/2023, 12:38 PM

## 2023-01-28 NOTE — Plan of Care (Signed)

## 2023-01-28 NOTE — Progress Notes (Signed)
ASSUMPTION OF CARE NOTE   01/28/2023 10:14 AM  Damon Williams was seen and examined.  The H&P by the admitting provider, orders, imaging was reviewed.  Please see new orders.  Pt remains symptomatic. HD is scheduled to be done today.  HD orders are in.  Pt noted that he had been stopping his HD treatments after 3 hours due to severe cramping.  He says that he is now willing to try doing 4 hour long treatments again. He is already asking about going home. I said that he would be able to discharge when ok with the nephrology service.    Vitals:   01/28/23 0446 01/28/23 0928  BP: (!) 166/93 (!) 163/100  Pulse: 91 88  Resp:    Temp: 97.8 F (36.6 C) 98.2 F (36.8 C)  SpO2: 91% 92%    Results for orders placed or performed during the hospital encounter of 01/27/23  Comprehensive metabolic panel  Result Value Ref Range   Sodium 136 135 - 145 mmol/L   Potassium 3.8 3.5 - 5.1 mmol/L   Chloride 99 98 - 111 mmol/L   CO2 21 (L) 22 - 32 mmol/L   Glucose, Bld 88 70 - 99 mg/dL   BUN 70 (H) 6 - 20 mg/dL   Creatinine, Ser 9.52 (H) 0.61 - 1.24 mg/dL   Calcium 7.7 (L) 8.9 - 10.3 mg/dL   Total Protein 6.3 (L) 6.5 - 8.1 g/dL   Albumin 2.9 (L) 3.5 - 5.0 g/dL   AST 16 15 - 41 U/L   ALT 12 0 - 44 U/L   Alkaline Phosphatase 43 38 - 126 U/L   Total Bilirubin 0.5 0.3 - 1.2 mg/dL   GFR, Estimated 6 (L) >60 mL/min   Anion gap 16 (H) 5 - 15  CBC with Differential  Result Value Ref Range   WBC 4.4 4.0 - 10.5 K/uL   RBC 2.94 (L) 4.22 - 5.81 MIL/uL   Hemoglobin 8.9 (L) 13.0 - 17.0 g/dL   HCT 84.1 (L) 32.4 - 40.1 %   MCV 91.2 80.0 - 100.0 fL   MCH 30.3 26.0 - 34.0 pg   MCHC 33.2 30.0 - 36.0 g/dL   RDW 02.7 25.3 - 66.4 %   Platelets 248 150 - 400 K/uL   nRBC 0.0 0.0 - 0.2 %   Neutrophils Relative % 48 %   Neutro Abs 2.1 1.7 - 7.7 K/uL   Lymphocytes Relative 34 %   Lymphs Abs 1.5 0.7 - 4.0 K/uL   Monocytes Relative 11 %   Monocytes Absolute 0.5 0.1 - 1.0 K/uL   Eosinophils Relative 6 %    Eosinophils Absolute 0.3 0.0 - 0.5 K/uL   Basophils Relative 1 %   Basophils Absolute 0.0 0.0 - 0.1 K/uL   Immature Granulocytes 0 %   Abs Immature Granulocytes 0.01 0.00 - 0.07 K/uL  Brain natriuretic peptide  Result Value Ref Range   B Natriuretic Peptide 1,945.0 (H) 0.0 - 100.0 pg/mL  Comprehensive metabolic panel  Result Value Ref Range   Sodium 138 135 - 145 mmol/L   Potassium 4.0 3.5 - 5.1 mmol/L   Chloride 103 98 - 111 mmol/L   CO2 21 (L) 22 - 32 mmol/L   Glucose, Bld 85 70 - 99 mg/dL   BUN 76 (H) 6 - 20 mg/dL   Creatinine, Ser 40.34 (H) 0.61 - 1.24 mg/dL   Calcium 7.7 (L) 8.9 - 10.3 mg/dL   Total Protein 5.6 (L) 6.5 - 8.1  g/dL   Albumin 2.6 (L) 3.5 - 5.0 g/dL   AST 13 (L) 15 - 41 U/L   ALT 11 0 - 44 U/L   Alkaline Phosphatase 37 (L) 38 - 126 U/L   Total Bilirubin 0.7 0.3 - 1.2 mg/dL   GFR, Estimated 5 (L) >60 mL/min   Anion gap 14 5 - 15  Magnesium  Result Value Ref Range   Magnesium 2.0 1.7 - 2.4 mg/dL  CBC with Differential/Platelet  Result Value Ref Range   WBC 4.5 4.0 - 10.5 K/uL   RBC 2.72 (L) 4.22 - 5.81 MIL/uL   Hemoglobin 8.3 (L) 13.0 - 17.0 g/dL   HCT 29.5 (L) 62.1 - 30.8 %   MCV 91.5 80.0 - 100.0 fL   MCH 30.5 26.0 - 34.0 pg   MCHC 33.3 30.0 - 36.0 g/dL   RDW 65.7 84.6 - 96.2 %   Platelets 222 150 - 400 K/uL   nRBC 0.0 0.0 - 0.2 %   Neutrophils Relative % 72 %   Neutro Abs 3.2 1.7 - 7.7 K/uL   Lymphocytes Relative 19 %   Lymphs Abs 0.9 0.7 - 4.0 K/uL   Monocytes Relative 7 %   Monocytes Absolute 0.3 0.1 - 1.0 K/uL   Eosinophils Relative 2 %   Eosinophils Absolute 0.1 0.0 - 0.5 K/uL   Basophils Relative 0 %   Basophils Absolute 0.0 0.0 - 0.1 K/uL   Immature Granulocytes 0 %   Abs Immature Granulocytes 0.01 0.00 - 0.07 K/uL  Phosphorus  Result Value Ref Range   Phosphorus 7.6 (H) 2.5 - 4.6 mg/dL  Troponin I (High Sensitivity)  Result Value Ref Range   Troponin I (High Sensitivity) 40 (H) <18 ng/L  Troponin I (High Sensitivity)  Result Value  Ref Range   Troponin I (High Sensitivity) 36 (H) <18 ng/L   C. Laural Benes, MD [facility]   01/27/2023  7:37 PM How to contact the [facility] Attending or Consulting provider 7A - 7P or covering provider during after hours 7P -7A, for this patient?  Check the care team in [facility] and look for a) attending/consulting [facility] provider listed and b) the [facility] team listed Log into [email or web address] and use [place]'s universal password to access. If you do not have the password, please contact the hospital operator. Locate the [facility] provider you are looking for under [facility] and page to a number that you can be directly reached. If you still have difficulty reaching the provider, please page the [facility] (Director on Call) for the Hospitalists listed on amion for assistance.

## 2023-01-28 NOTE — Hospital Course (Signed)
62 y.o. male with medical history significant of cardiomyopathy, history of cocaine use,, ESRD on HD, hypertension, former smoker, former drinker, presents to the ER with a chief complaint of dyspnea.  Patient reports his dyspnea started yesterday.  It has been constant but not worse since it started.  He does not have an oxygen requirement at baseline.  He was coughing yesterday but it was nonproductive.  He denies any fever, chest pain, dizziness.  Patient reports his dyspnea is worse on exertion and with laying flat.  Patient reports palpitations.  He has had dyspnea like this before but is not sure what it was from.  Patient is on HD.  He reports he has been going and has not missed any sessions.  His schedule is Tuesday, Thursday, Saturday.  Patient has been cutting his session short to 3 hours instead of 4 hours due to obligations to be home to take care of his parents.  He reports he had peripheral edema for a couple of weeks.  He has very minimal urine output and that is normal for him.  Patient denies any dysuria or hematuria.  Patient has no other complaints at this time.   Patient is a former smoker, former drinker.  He has not been vaccinated for COVID.  Patient is full code.

## 2023-01-28 NOTE — H&P (Signed)
History and Physical    Patient: Damon Williams:[id number] DOB: March 15, 1961 DOA: 01/27/2023 DOS: the patient was seen and examined on 01/28/2023 PCP: Kara Pacer, NP  Patient coming from: Home  Chief Complaint:  Chief Complaint  Patient presents with   Shortness of Breath   Peripheral edema   HPI: Damon Williams is a 62 y.o. male with medical history significant of cardiomyopathy, history of cocaine use,, ESRD on HD, hypertension, former smoker, former drinker, presents to the ER with a chief complaint of dyspnea.  Patient reports his dyspnea started yesterday.  It has been constant but not worse since it started.  He does not have an oxygen requirement at baseline.  He was coughing yesterday but it was nonproductive.  He denies any fever, chest pain, dizziness.  Patient reports his dyspnea is worse on exertion and with laying flat.  Patient reports palpitations.  He has had dyspnea like this before but is not sure what it was from.  Patient is on HD.  He reports he has been going and has not missed any sessions.  His schedule is Tuesday, Thursday, Saturday.  Patient has been cutting his session short to 3 hours instead of 4 hours due to obligations to be home to take care of his parents.  He reports he had peripheral edema for a couple of weeks.  He has very minimal urine output and that is normal for him.  Patient denies any dysuria or hematuria.  Patient has no other complaints at this time.  Patient is a former smoker, former drinker.  He has not been vaccinated for COVID.  Patient is full code. Review of Systems: As mentioned in the history of present illness. All other systems reviewed and are negative. Past Medical History:  Diagnosis Date   Cardiomyopathy (HCC)    CKD    Cocaine use    ESRD (end stage renal disease) on dialysis (HCC) 12/05/2022   Hypertension    Tobacco abuse    Past Surgical History:  Procedure Laterality Date   AV FISTULA PLACEMENT Left  12/27/2022   Procedure: INSERTION OF LEFT ARM  ARTERIOVENOUS FISTULA;  Surgeon: Larina Earthly, MD;  Location: [facility];  Service: Vascular;  Laterality: Left;   BACK SURGERY     IR FLUORO GUIDE CV LINE RIGHT  12/07/2022   IR US GUIDE VASC ACCESS RIGHT  12/07/2022   Social History:  reports that he has been smoking cigarettes. He has a 30 pack-year smoking history. He quit smokeless tobacco use about 4 months ago. He reports that he does not currently use alcohol. He reports current drug use. Drug: Codeine.  No Known Allergies  Family History  Problem Relation Age of Onset   Heart disease Neg Hx     Prior to Admission medications   Medication Sig Start Date End Date Taking? Authorizing Provider  amLODipine (NORVASC) 10 MG tablet Take 1 tablet (10 mg total) by mouth daily. 11/09/22   Johnson, Clanford L, MD  calcium acetate (PHOSLO) 667 MG capsule Take 667 mg by mouth 3 (three) times daily with meals.    [provider]  carvedilol (COREG) 6.25 MG tablet Take 1 tablet (6.25 mg total) by mouth 2 (two) times daily with a meal. 01/20/23   Leone Brand, NP  hydrALAZINE (APRESOLINE) 25 MG tablet Take 1 tablet (25 mg total) by mouth 3 (three) times daily. 11/08/22   Johnson, Clanford L, MD  isosorbide mononitrate (IMDUR) 30 MG 24 hr  tablet Take 0.5 tablets (15 mg total) by mouth daily. 11/09/22   Johnson, Clanford L, MD  traZODone (DESYREL) 100 MG tablet Take 100 mg by mouth at bedtime.    [provider]    Physical Exam: Vitals:   01/27/23 1945 01/27/23 1948 01/27/23 2223 01/27/23 2338  BP:  (!) 165/99 (!) 165/92 (!) 177/101  Pulse: 97  95 97  Resp: 18  (!) 22   Temp: 98 F (36.7 C)  (!) 97.5 F (36.4 C) 97.8 F (36.6 C)  TempSrc: Oral  Oral Oral  SpO2: 93%  92% 90%  Weight:      Height:       1.  General: Patient lying supine in bed,  no acute distress   2. Psychiatric: Alert and oriented x 3, mood and behavior normal for situation, pleasant and cooperative with  exam   3. Neurologic: Speech and language are normal, face is symmetric, moves all 4 extremities voluntarily, at baseline without acute deficits on limited exam   4. HEENMT:  Head is atraumatic, normocephalic, pupils reactive to light, neck is supple, trachea is midline, mucous membranes are moist   5. Respiratory : Diminished in the lower lung fields without wheezing, rhonchi, rales, no cyanosis, no increase in work of breathing or accessory muscle use   6. Cardiovascular : Heart rate normal, rhythm is regular, no rubs or gallops, +peripheral edema, peripheral pulses palpated   7. Gastrointestinal:  Abdomen is soft, nondistended, nontender to palpation bowel sounds active, no masses or organomegaly palpated   8. Skin:  Skin is warm, dry and intact without rashes, acute lesions, or ulcers on limited exam   9.Musculoskeletal:  No acute deformities or trauma, no asymmetry in tone,+ peripheral edema, peripheral pulses palpated, no tenderness to palpation in the extremities  Data Reviewed: In the ED Temp 98, heart rate 97, respiratory 18, blood pressure 165/99, satting at 93% on 2 L nasal cannula No leukocytosis, hemoglobin stable at 8.9 Chemistry reveals a BUN of 70 and a creatinine of 9.88 Albumin 2.9 BNP 1945 Troponin downtrending 40, 36 Chest x-ray shows cardiomegaly with vascular congestion and edema EKG shows a heart rate of 97, sinus rhythm, QTc 496 Nephro consulted and plans to dialyze patient in the a.m. Admission was requested for acute respiratory failure with hypoxia secondary to volume overload and cardiorenal syndrome   Assessment and Plan: * Acute respiratory failure with hypoxia (HCC) - Requiring 2 L nasal cannula - Secondary to CHF/cardiorenal syndrome - Defer volume management to nephrology - Wean off O2 as tolerated - Anticipate improvement with HD tomorrow  CHF exacerbation (HCC) - Dyspnea on exertion, new O2 requirement, peripheral edema, orthopnea,  cardiomegaly with vascular congestion and edema on chest x-ray - Troponin downtrending 40, 36 - BNP 1945 - Denies chest pain - Last echo showed an ejection fraction of 30-35% with grade 2 diastolic dysfunction - Continue beta-blocker, Imdur - Update echo - Monitor intake and output - Reds clip - Daily weights - Fluid restrictions - Exacerbation secondary to cardiorenal syndrome as patient has not been doing full HD sessions - Defer volume management  to nephrology - Monitor on telemetry  ESRD on hemodialysis [facility]) - Tuesday/Thursday/Saturday dialysis - Last dialyzed on Thursday - Has not done a full session in quite some time due to needing to be home - Consults nephro, Dr. Ronalee Belts notified via inbox - Defer volume control to nephro - Defer electrolyte management to nephro - Continue PhosLo - Continue to monitor  Uncontrolled hypertension - Continue Norco, Coreg, hydralazine - BP in the ED 165/99 - Asymptomatic, not meeting criteria for hypertensive crisis - Will add as needed antihypertensive if indicated - Likely will improve with volume control - Continue to monitor      Advance Care Planning:   Code Status: Full Code  Consults: Nephrology  Family Communication: No family at bedside  Severity of Illness: The appropriate patient status for this patient is OBSERVATION. Observation status is judged to be reasonable and necessary in order to provide the required intensity of service to ensure the patient's safety. The patient's presenting symptoms, physical exam findings, and initial radiographic and laboratory data in the context of their medical condition is felt to place them at decreased risk for further clinical deterioration. Furthermore, it is anticipated that the patient will be medically stable for discharge from the hospital within 2 midnights of admission.   Author: Lilyan Gilford, DO 01/28/2023 1:04 AM  For on call review [email or web address].

## 2023-01-28 NOTE — Assessment & Plan Note (Signed)
-   Tuesday/Thursday/Saturday dialysis - Last dialyzed on Thursday - Has not done a full session in quite some time due to needing to be home - Consults nephro, Dr. Ronalee Belts notified via inbox - Defer volume control to nephro - Defer electrolyte management to nephro - Continue PhosLo - Continue to monitor

## 2023-01-28 NOTE — ED Provider Notes (Signed)
[facility] MEDICAL SURGICAL UNIT Provider Note  CSN: [id number] Arrival date & time: 01/27/23 1924  Chief Complaint(s) Shortness of Breath and Peripheral edema  HPI Damon Williams is a 62 y.o. male with PMH ESRD on hemodialysis Tuesday Thursday Saturday, HTN, CHF with cardiomyopathy who presents emergency department for evaluation of shortness of breath, orthopnea and peripheral edema.  Patient states that over the last few days he has had progressive worsening dyspnea and inability to lie flat at night due to shortness of breath.  Endorses worsening lower extremity edema.  Has been compliant with his dialysis and his last session was on Thursday.  Denies chest pain, abdominal pain, nausea, vomiting or other systemic symptoms.   Past Medical History Past Medical History:  Diagnosis Date   Cardiomyopathy (HCC)    CKD    Cocaine use    ESRD (end stage renal disease) on dialysis (HCC) 12/05/2022   Hypertension    Tobacco abuse    Patient Active Problem List   Diagnosis Date Noted   CHF exacerbation (HCC) 01/28/2023   Acute on chronic systolic CHF (congestive heart failure) (HCC) 01/28/2023   Acute respiratory failure with hypoxia (HCC) 01/27/2023   Uremia 12/05/2022   ESRD on hemodialysis (HCC) 12/05/2022   AKI (acute kidney injury) (HCC) 12/04/2022   Hyperkalemia 12/04/2022   Metabolic acidosis 12/04/2022   Cardiomyopathy (HCC) 11/05/2022   Acute renal failure (ARF) (HCC) 11/04/2022   Cardiomegaly 11/04/2022   Uncontrolled hypertension 11/04/2022   Anemia in chronic kidney disease (CKD) 11/04/2022   CKD (chronic kidney disease) 11/04/2022   Current smoker 11/04/2022   Home Medication(s) Prior to Admission medications   Medication Sig Start Date End Date Taking? Authorizing Provider  amLODipine (NORVASC) 10 MG tablet Take 1 tablet (10 mg total) by mouth daily. 11/09/22   Johnson, Clanford L, MD  calcium acetate (PHOSLO) 667 MG capsule Take 667 mg by mouth 3 (three) times  daily with meals.    [provider]  carvedilol (COREG) 6.25 MG tablet Take 1 tablet (6.25 mg total) by mouth 2 (two) times daily with a meal. 01/20/23   Leone Brand, NP  hydrALAZINE (APRESOLINE) 25 MG tablet Take 1 tablet (25 mg total) by mouth 3 (three) times daily. 11/08/22   Johnson, Clanford L, MD  isosorbide mononitrate (IMDUR) 30 MG 24 hr tablet Take 0.5 tablets (15 mg total) by mouth daily. 11/09/22   Johnson, Clanford L, MD  traZODone (DESYREL) 100 MG tablet Take 100 mg by mouth at bedtime.    [provider]                                                                                                                                    Past Surgical History Past Surgical History:  Procedure Laterality Date   AV FISTULA PLACEMENT Left 12/27/2022   Procedure: INSERTION OF LEFT ARM  ARTERIOVENOUS FISTULA;  Surgeon: Larina Earthly,  MD;  Location: [facility];  Service: Vascular;  Laterality: Left;   BACK SURGERY     IR FLUORO GUIDE CV LINE RIGHT  12/07/2022   IR US GUIDE VASC ACCESS RIGHT  12/07/2022   Family History Family History  Problem Relation Age of Onset   Heart disease Neg Hx     Social History Social History   Tobacco Use   Smoking status: Every Day    Current packs/day: 1.00    Average packs/day: 1 pack/day for 30.0 years (30.0 ttl pk-yrs)    Types: Cigarettes   Smokeless tobacco: Former    Quit date: 09/2022  Vaping Use   Vaping status: Never Used  Substance Use Topics   Alcohol use: Not Currently    Comment: quit 1989   Drug use: Yes    Types: Codeine   Allergies Patient has no known allergies.  Review of Systems Review of Systems  Respiratory:  Positive for shortness of breath.   Cardiovascular:  Positive for leg swelling.    Physical Exam Vital Signs  I have reviewed the triage vital signs BP (!) 163/100 (BP Location: Right Arm) Comment: MD Laural Benes made aware.  Pulse 88   Temp 98.2 F (36.8 C) (Oral)   Resp 20   Ht 5' 7.5" (1.715  m)   Wt 69.9 kg   SpO2 92%   BMI 23.76 kg/m   Physical Exam Constitutional:      General: He is not in acute distress.    Appearance: Normal appearance.  HENT:     Head: Normocephalic and atraumatic.     Nose: No congestion or rhinorrhea.  Eyes:     General:        Right eye: No discharge.        Left eye: No discharge.     Extraocular Movements: Extraocular movements intact.     Pupils: Pupils are equal, round, and reactive to light.  Cardiovascular:     Rate and Rhythm: Normal rate and regular rhythm.     Heart sounds: No murmur heard. Pulmonary:     Effort: No respiratory distress.     Breath sounds: Rales present. No wheezing.  Abdominal:     General: There is no distension.     Tenderness: There is no abdominal tenderness.  Musculoskeletal:        General: Normal range of motion.     Cervical back: Normal range of motion.     Right lower leg: Edema present.     Left lower leg: Edema present.  Skin:    General: Skin is warm and dry.  Neurological:     General: No focal deficit present.     Mental Status: He is alert.     ED Results and Treatments Labs (all labs ordered are listed, but only abnormal results are displayed) Labs Reviewed  COMPREHENSIVE METABOLIC PANEL - Abnormal; Notable for the following components:      Result Value   CO2 21 (*)    BUN 70 (*)    Creatinine, Ser 9.88 (*)    Calcium 7.7 (*)    Total Protein 6.3 (*)    Albumin 2.9 (*)    GFR, Estimated 6 (*)    Anion gap 16 (*)    All other components within normal limits  CBC WITH DIFFERENTIAL/PLATELET - Abnormal; Notable for the following components:   RBC 2.94 (*)    Hemoglobin 8.9 (*)    HCT 26.8 (*)    All other  components within normal limits  BRAIN NATRIURETIC PEPTIDE - Abnormal; Notable for the following components:   B Natriuretic Peptide 1,945.0 (*)    All other components within normal limits  COMPREHENSIVE METABOLIC PANEL - Abnormal; Notable for the following components:    CO2 21 (*)    BUN 76 (*)    Creatinine, Ser 10.60 (*)    Calcium 7.7 (*)    Total Protein 5.6 (*)    Albumin 2.6 (*)    AST 13 (*)    Alkaline Phosphatase 37 (*)    GFR, Estimated 5 (*)    All other components within normal limits  CBC WITH DIFFERENTIAL/PLATELET - Abnormal; Notable for the following components:   RBC 2.72 (*)    Hemoglobin 8.3 (*)    HCT 24.9 (*)    All other components within normal limits  PHOSPHORUS - Abnormal; Notable for the following components:   Phosphorus 7.6 (*)    All other components within normal limits  TROPONIN I (HIGH SENSITIVITY) - Abnormal; Notable for the following components:   Troponin I (High Sensitivity) 40 (*)    All other components within normal limits  TROPONIN I (HIGH SENSITIVITY) - Abnormal; Notable for the following components:   Troponin I (High Sensitivity) 36 (*)    All other components within normal limits  MAGNESIUM  HEPATITIS B SURFACE ANTIGEN  HEPATITIS B SURFACE ANTIBODY, QUANTITATIVE                                                                                                                          Radiology DG Chest Portable 1 View  Result Date: 01/27/2023 CLINICAL DATA:  Shortness of breath. EXAM: PORTABLE CHEST 1 VIEW COMPARISON:  Chest radiograph dated 12/04/2022. FINDINGS: Dialysis catheter with tip at the cavoatrial junction. There is cardiomegaly with vascular congestion and edema as well as small bilateral pleural effusions, new since the prior radiograph. No pneumothorax. No acute osseous pathology. IMPRESSION: Cardiomegaly with vascular congestion and edema. Electronically Signed   By: Elgie Collard M.D.   On: 01/27/2023 21:04    Pertinent labs & imaging results that were available during my care of the patient were reviewed by me and considered in my medical decision making (see MDM for details).  Medications Ordered in ED Medications  Chlorhexidine Gluconate Cloth 2 % PADS 6 each (6 each Topical Given  01/28/23 0619)  amLODipine (NORVASC) tablet 10 mg (has no administration in time range)  carvedilol (COREG) tablet 6.25 mg (has no administration in time range)  hydrALAZINE (APRESOLINE) tablet 25 mg (has no administration in time range)  isosorbide mononitrate (IMDUR) 24 hr tablet 15 mg (has no administration in time range)  traZODone (DESYREL) tablet 100 mg (100 mg Oral Given 01/27/23 2350)  calcium acetate (PHOSLO) capsule 667 mg (667 mg Oral Given 01/28/23 1009)  heparin injection 5,000 Units (5,000 Units Subcutaneous Not Given 01/28/23 0619)  acetaminophen (TYLENOL) tablet 650 mg (has no administration in time range)  Or  acetaminophen (TYLENOL) suppository 650 mg (has no administration in time range)  oxyCODONE (Oxy IR/ROXICODONE) immediate release tablet 5 mg (has no administration in time range)  ondansetron (ZOFRAN) tablet 4 mg (has no administration in time range)    Or  ondansetron (ZOFRAN) injection 4 mg (has no administration in time range)  albuterol (PROVENTIL) (2.5 MG/3ML) 0.083% nebulizer solution 2.5 mg (has no administration in time range)  labetalol (NORMODYNE) injection 10 mg (has no administration in time range)                                                                                                                                     Procedures .Critical Care  Performed by: Glendora Score, MD Authorized by: Glendora Score, MD   Critical care provider statement:    Critical care time (minutes):  30   Critical care was necessary to treat or prevent imminent or life-threatening deterioration of the following conditions:  Respiratory failure   Critical care was time spent personally by me on the following activities:  Development of treatment plan with patient or surrogate, discussions with consultants, evaluation of patient's response to treatment, examination of patient, ordering and review of laboratory studies, ordering and review of radiographic studies,  ordering and performing treatments and interventions, pulse oximetry, re-evaluation of patient's condition and review of old charts Ultrasound ED Peripheral IV (Provider)  Date/Time: 01/28/2023 12:25 PM  Performed by: Glendora Score, MD Authorized by: Glendora Score, MD   Procedure details:    Indications: multiple failed IV attempts     Skin Prep: isopropyl alcohol     Location: R brachial.   Angiocath:  20 G   Bedside Ultrasound Guided: Yes     Images: not archived     Patient tolerated procedure without complications: Yes     Dressing applied: Yes     (including critical care time)  Medical Decision Making / ED Course   This patient presents to the ED for concern of shortness of breath, this involves an extensive number of treatment options, and is a complaint that carries with it a high risk of complications and morbidity.  The differential diagnosis includes Pe, PTX, Pulmonary Edema, ARDS, COPD/Asthma, ACS, CHF exacerbation, Arrhythmia, Pericardial Effusion/Tamponade, Anemia, Sepsis, Acidosis/Hypercapnia, Anxiety, Viral URI  MDM: Patient seen emergency room for evaluation of shortness of breath and leg swelling.  Physical exam with 2+ pitting edema bilaterally, rales at the bases.  Patient saturating 87% on room air and placed on 2 L nasal cannula.  Difficult IV access and I personally placed a bedside ultrasound-guided IV in his brachial vein on the right.  Laboratory evaluation with a hemoglobin of 9.8, BUN 70, creatinine 9.88 consistent with his history of ESRD on hemodialysis, BNP significant elevated to 1945.  Chest x-ray with pulmonary edema.  I spoke with the nephrologist on-call Dr. Wolfgang Phoenix who will help arrange for dialysis tomorrow.  Patient will  require hospital admission for fluid overload and need for emergent dialysis.   Additional history obtained:  -External records from outside source obtained and reviewed including: Chart review including previous notes, labs,  imaging, consultation notes   Lab Tests: -I ordered, reviewed, and interpreted labs.   The pertinent results include:   Labs Reviewed  COMPREHENSIVE METABOLIC PANEL - Abnormal; Notable for the following components:      Result Value   CO2 21 (*)    BUN 70 (*)    Creatinine, Ser 9.88 (*)    Calcium 7.7 (*)    Total Protein 6.3 (*)    Albumin 2.9 (*)    GFR, Estimated 6 (*)    Anion gap 16 (*)    All other components within normal limits  CBC WITH DIFFERENTIAL/PLATELET - Abnormal; Notable for the following components:   RBC 2.94 (*)    Hemoglobin 8.9 (*)    HCT 26.8 (*)    All other components within normal limits  BRAIN NATRIURETIC PEPTIDE - Abnormal; Notable for the following components:   B Natriuretic Peptide 1,945.0 (*)    All other components within normal limits  COMPREHENSIVE METABOLIC PANEL - Abnormal; Notable for the following components:   CO2 21 (*)    BUN 76 (*)    Creatinine, Ser 10.60 (*)    Calcium 7.7 (*)    Total Protein 5.6 (*)    Albumin 2.6 (*)    AST 13 (*)    Alkaline Phosphatase 37 (*)    GFR, Estimated 5 (*)    All other components within normal limits  CBC WITH DIFFERENTIAL/PLATELET - Abnormal; Notable for the following components:   RBC 2.72 (*)    Hemoglobin 8.3 (*)    HCT 24.9 (*)    All other components within normal limits  PHOSPHORUS - Abnormal; Notable for the following components:   Phosphorus 7.6 (*)    All other components within normal limits  TROPONIN I (HIGH SENSITIVITY) - Abnormal; Notable for the following components:   Troponin I (High Sensitivity) 40 (*)    All other components within normal limits  TROPONIN I (HIGH SENSITIVITY) - Abnormal; Notable for the following components:   Troponin I (High Sensitivity) 36 (*)    All other components within normal limits  MAGNESIUM  HEPATITIS B SURFACE ANTIGEN  HEPATITIS B SURFACE ANTIBODY, QUANTITATIVE      EKG   EKG Interpretation Date/Time:  Friday January 27 2023 19:45:24  EDT Ventricular Rate:  97 PR Interval:  199 QRS Duration:  90 QT Interval:  390 QTC Calculation: 496 R Axis:   77  Text Interpretation: Sinus rhythm Borderline prolonged PR interval Left ventricular hypertrophy Confirmed by Alek Borges ([id number]) on 01/28/2023 12:28:09 PM         Imaging Studies ordered: I ordered imaging studies including chest x-ray I independently visualized and interpreted imaging. I agree with the radiologist interpretation   Medicines ordered and prescription drug management: Meds ordered this encounter  Medications   DISCONTD: albuterol (VENTOLIN HFA) 108 (90 Base) MCG/ACT inhaler 2 puff   Chlorhexidine Gluconate Cloth 2 % PADS 6 each   amLODipine (NORVASC) tablet 10 mg   carvedilol (COREG) tablet 6.25 mg   hydrALAZINE (APRESOLINE) tablet 25 mg   isosorbide mononitrate (IMDUR) 24 hr tablet 15 mg   traZODone (DESYREL) tablet 100 mg   calcium acetate (PHOSLO) capsule 667 mg   heparin injection 5,000 Units   OR Linked Order Group    acetaminophen (TYLENOL)  tablet 650 mg    acetaminophen (TYLENOL) suppository 650 mg   oxyCODONE (Oxy IR/ROXICODONE) immediate release tablet 5 mg   OR Linked Order Group    ondansetron (ZOFRAN) tablet 4 mg    ondansetron (ZOFRAN) injection 4 mg   albuterol (PROVENTIL) (2.5 MG/3ML) 0.083% nebulizer solution 2.5 mg   labetalol (NORMODYNE) injection 10 mg    -I have reviewed the patients home medicines and have made adjustments as needed  Critical interventions Supplemental oxygen, nephrology consultation  Consultations Obtained: I requested consultation with the nephrologist on-call,  and discussed lab and imaging findings as well as pertinent plan - they recommend: Admission for dialysis tomorrow   Cardiac Monitoring: The patient was maintained on a cardiac monitor.  I personally viewed and interpreted the cardiac monitored which showed an underlying rhythm of: NSR  Social Determinants of Health:  Factors impacting  patients care include: none   Reevaluation: After the interventions noted above, I reevaluated the patient and found that they have :improved  Co morbidities that complicate the patient evaluation  Past Medical History:  Diagnosis Date   Cardiomyopathy (HCC)    CKD    Cocaine use    ESRD (end stage renal disease) on dialysis (HCC) 12/05/2022   Hypertension    Tobacco abuse       Dispostion: I considered admission for this patient, and due to fluid overload in the setting of ESRD and need for supplemental oxygen and hemodialysis patient require hospital admission     Final Clinical Impression(s) / ED Diagnoses Final diagnoses:  Other hypervolemia     @PCDICTATION @    Glendora Score, MD 01/28/23 1228

## 2023-01-28 NOTE — Assessment & Plan Note (Signed)
-   Dyspnea on exertion, new O2 requirement, peripheral edema, orthopnea, cardiomegaly with vascular congestion and edema on chest x-ray - Troponin downtrending 40, 36 - BNP 1945 - Denies chest pain - Last echo showed an ejection fraction of 30-35% with grade 2 diastolic dysfunction - Continue beta-blocker, Imdur - Update echo - Monitor intake and output - Reds clip - Daily weights - Fluid restrictions - Exacerbation secondary to cardiorenal syndrome as patient has not been doing full HD sessions - Defer volume management  to nephrology - Monitor on telemetry

## 2023-01-28 NOTE — Assessment & Plan Note (Signed)
-   Requiring 2 L nasal cannula - Secondary to CHF/cardiorenal syndrome - Defer volume management to nephrology - Wean off O2 as tolerated - Anticipate improvement with HD tomorrow

## 2023-01-28 NOTE — Progress Notes (Signed)
Tele called patient had a four beat run of unifocal PVC's at 0830. MD Laural Benes made aware.

## 2023-01-28 NOTE — Progress Notes (Signed)
Patient called this writer to room, yelled out I'm leaving at 7 pm if I'm not at dialysis by then. Patient educated, patient stated he did not care he has been waiting since last night. Spoke with dialysis nurse, per nurse they will be up to get patient soon. MD Laural Benes made aware.

## 2023-01-28 NOTE — Progress Notes (Signed)
   01/28/23 1117  TOC Brief Assessment  Insurance and Status Reviewed  Patient has primary care physician Yes  Home environment has been reviewed Home with family  Prior level of function: independent on HD  Prior/Current Home Services No current home services  Social Determinants of Health Reivew SDOH reviewed no interventions necessary  Readmission risk has been reviewed Yes  Transition of care needs no transition of care needs at this time   Patient goes to [facility] in [place]

## 2023-01-31 DIAGNOSIS — Z992 Dependence on renal dialysis: Secondary | ICD-10-CM | POA: Diagnosis not present

## 2023-01-31 DIAGNOSIS — N186 End stage renal disease: Secondary | ICD-10-CM | POA: Diagnosis not present

## 2023-01-31 LAB — HEPATITIS B SURFACE ANTIBODY, QUANTITATIVE: Hep B S AB Quant (Post): <3.5 m[IU]/mL — ABNORMAL LOW

## 2023-01-31 NOTE — Progress Notes (Deleted)
    Postoperative Access Visit   History of Present Illness   Damon Williams is a 62 y.o. year old male who presents for postoperative follow-up for:  Left brachiocephalic AV fistula creation on 6/11/124 by Dr. Arbie Cookey.  The patient's wounds are *** healed.  The patient notes *** steal symptoms.  The patient is *** able to complete their activities of daily living.  The patient's current symptoms are: ***.   Physical Examination  There were no vitals filed for this visit. There is no height or weight on file to calculate BMI.  left arm Incision is *** healed, *** radial pulse, hand grip is ***/5, sensation in digits is *** intact, ***palpable thrill, bruit can *** be auscultated     Medical Decision Making   Damon Williams is a 62 y.o. year old male who presents s/p Left brachiocephalic AV fistula creation on 6/11/124 by Dr. Arbie Cookey.   Patent *** without signs or symptoms of steal syndrome The patient's access will be ready for use *** ***The patient's tunneled dialysis catheter can be removed when Nephrology is comfortable with the performance of the *** The patient may follow up on a prn basis   Graceann Congress, PA-C [facility] Office: [phone]  Clinic MD: Steve Rattler

## 2023-02-02 DIAGNOSIS — Z992 Dependence on renal dialysis: Secondary | ICD-10-CM | POA: Diagnosis not present

## 2023-02-02 DIAGNOSIS — N186 End stage renal disease: Secondary | ICD-10-CM | POA: Diagnosis not present

## 2023-02-04 DIAGNOSIS — Z992 Dependence on renal dialysis: Secondary | ICD-10-CM | POA: Diagnosis not present

## 2023-02-04 DIAGNOSIS — N186 End stage renal disease: Secondary | ICD-10-CM | POA: Diagnosis not present

## 2023-02-05 ENCOUNTER — Emergency Department (HOSPITAL_COMMUNITY): Payer: 59

## 2023-02-05 ENCOUNTER — Inpatient Hospital Stay (HOSPITAL_COMMUNITY)
Admission: EM | Admit: 2023-02-05 | Discharge: 2023-02-07 | DRG: 291 | Disposition: A | Discharge status: Home / Self Care | Payer: 59 | Attending: Family Medicine | Admitting: Family Medicine

## 2023-02-05 ENCOUNTER — Other Ambulatory Visit: Payer: Self-pay

## 2023-02-05 ENCOUNTER — Encounter (HOSPITAL_COMMUNITY): Payer: Self-pay | Admitting: Emergency Medicine

## 2023-02-05 ENCOUNTER — Emergency Department (HOSPITAL_COMMUNITY)
Admission: EM | Admit: 2023-02-05 | Discharge: 2023-02-05 | Disposition: A | Discharge status: Home / Self Care | Payer: 59 | Source: Home / Self Care | Attending: Emergency Medicine | Admitting: Emergency Medicine

## 2023-02-05 ENCOUNTER — Encounter (HOSPITAL_COMMUNITY): Payer: Self-pay

## 2023-02-05 DIAGNOSIS — E8779 Other fluid overload: Principal | ICD-10-CM

## 2023-02-05 DIAGNOSIS — R600 Localized edema: Secondary | ICD-10-CM | POA: Insufficient documentation

## 2023-02-05 DIAGNOSIS — R0602 Shortness of breath: Secondary | ICD-10-CM | POA: Insufficient documentation

## 2023-02-05 DIAGNOSIS — J811 Chronic pulmonary edema: Secondary | ICD-10-CM | POA: Diagnosis not present

## 2023-02-05 DIAGNOSIS — I120 Hypertensive chronic kidney disease with stage 5 chronic kidney disease or end stage renal disease: Secondary | ICD-10-CM | POA: Insufficient documentation

## 2023-02-05 DIAGNOSIS — Z636 Dependent relative needing care at home: Secondary | ICD-10-CM

## 2023-02-05 DIAGNOSIS — I10 Essential (primary) hypertension: Secondary | ICD-10-CM | POA: Diagnosis present

## 2023-02-05 DIAGNOSIS — N2581 Secondary hyperparathyroidism of renal origin: Secondary | ICD-10-CM | POA: Diagnosis present

## 2023-02-05 DIAGNOSIS — E8770 Fluid overload, unspecified: Secondary | ICD-10-CM | POA: Diagnosis not present

## 2023-02-05 DIAGNOSIS — F17210 Nicotine dependence, cigarettes, uncomplicated: Secondary | ICD-10-CM | POA: Diagnosis present

## 2023-02-05 DIAGNOSIS — J90 Pleural effusion, not elsewhere classified: Secondary | ICD-10-CM | POA: Diagnosis not present

## 2023-02-05 DIAGNOSIS — Z91158 Patient's noncompliance with renal dialysis for other reason: Secondary | ICD-10-CM

## 2023-02-05 DIAGNOSIS — N186 End stage renal disease: Secondary | ICD-10-CM | POA: Insufficient documentation

## 2023-02-05 DIAGNOSIS — Z79899 Other long term (current) drug therapy: Secondary | ICD-10-CM | POA: Insufficient documentation

## 2023-02-05 DIAGNOSIS — Z992 Dependence on renal dialysis: Secondary | ICD-10-CM | POA: Insufficient documentation

## 2023-02-05 DIAGNOSIS — D631 Anemia in chronic kidney disease: Secondary | ICD-10-CM | POA: Diagnosis present

## 2023-02-05 DIAGNOSIS — F17200 Nicotine dependence, unspecified, uncomplicated: Secondary | ICD-10-CM

## 2023-02-05 DIAGNOSIS — N250 Renal osteodystrophy: Secondary | ICD-10-CM | POA: Diagnosis present

## 2023-02-05 DIAGNOSIS — I132 Hypertensive heart and chronic kidney disease with heart failure and with stage 5 chronic kidney disease, or end stage renal disease: Secondary | ICD-10-CM | POA: Diagnosis not present

## 2023-02-05 DIAGNOSIS — R0902 Hypoxemia: Secondary | ICD-10-CM

## 2023-02-05 DIAGNOSIS — J9601 Acute respiratory failure with hypoxia: Secondary | ICD-10-CM | POA: Diagnosis present

## 2023-02-05 DIAGNOSIS — I5043 Acute on chronic combined systolic (congestive) and diastolic (congestive) heart failure: Secondary | ICD-10-CM | POA: Diagnosis present

## 2023-02-05 DIAGNOSIS — I517 Cardiomegaly: Secondary | ICD-10-CM | POA: Diagnosis not present

## 2023-02-05 LAB — BASIC METABOLIC PANEL
Anion gap: 5 (ref 5–15)
BUN: 50 mg/dL — ABNORMAL HIGH (ref 6–20)
CO2: 28 mmol/L (ref 22–32)
Calcium: 7.7 mg/dL — ABNORMAL LOW (ref 8.9–10.3)
Chloride: 102 mmol/L (ref 98–111)
Creatinine, Ser: 9.17 mg/dL — ABNORMAL HIGH (ref 0.61–1.24)
GFR, Estimated: 6 mL/min — ABNORMAL LOW (ref >60)
Glucose, Bld: 82 mg/dL (ref 70–99)
Potassium: 3.8 mmol/L (ref 3.5–5.1)
Sodium: 135 mmol/L (ref 135–145)

## 2023-02-05 LAB — CBC
HCT: 25.8 % — ABNORMAL LOW (ref 39.0–52.0)
Hemoglobin: 8.4 g/dL — ABNORMAL LOW (ref 13.0–17.0)
MCH: 30.2 pg (ref 26.0–34.0)
MCHC: 32.6 g/dL (ref 30.0–36.0)
MCV: 92.8 fL (ref 80.0–100.0)
Platelets: 288 10*3/uL (ref 150–400)
RBC: 2.78 MIL/uL — ABNORMAL LOW (ref 4.22–5.81)
RDW: 14.6 % (ref 11.5–15.5)
WBC: 4.9 10*3/uL (ref 4.0–10.5)
nRBC: 0 % (ref 0.0–0.2)

## 2023-02-05 LAB — TROPONIN I (HIGH SENSITIVITY)
Troponin I (High Sensitivity): 42 ng/L — ABNORMAL HIGH (ref <18)
Troponin I (High Sensitivity): 42 ng/L — ABNORMAL HIGH (ref <18)

## 2023-02-05 NOTE — ED Provider Notes (Signed)
[place] EMERGENCY DEPARTMENT AT [facility] Provider Note   CSN: [id number] Arrival date & time: 02/05/23  1312     History  Chief Complaint  Patient presents with   Shortness of Breath   Leg Swelling    Damon Williams is a 62 y.o. male.  History of cardiomyopathy, cocaine use, ESRD on dialysis Tuesday Thursday Saturday, hypertension.  The ER today complaining of lower extremity swelling and shortness of breath since last night around midnight.  States he did 3 hours dialysis yesterday and he states that he pulled off 1 1/2 L of fluids.  Reports some increased shortness of breath.  No fever or chills, no cough, no chest pain. Does still make small amounts of urine.  Been in for similar complaints on June 19, thought to have fluid overload for cutting his dialysis session short from 4 hours to 3 hours.  He was dialyzed and discharged home to resume his regular outpatient dialysis and informed to start completing his full 4 hours.  He was requiring supplemental oxygen at that time at 2 L.  Shortness of Breath      Home Medications Prior to Admission medications   Medication Sig Start Date End Date Taking? Authorizing Provider  amLODipine (NORVASC) 10 MG tablet Take 1 tablet (10 mg total) by mouth daily. 11/09/22  Yes Johnson, Clanford L, MD  calcium acetate (PHOSLO) 667 MG capsule Take 667 mg by mouth 3 (three) times daily with meals.   Yes [provider]  carvedilol (COREG) 6.25 MG tablet Take 1 tablet (6.25 mg total) by mouth 2 (two) times daily with a meal. 01/20/23  Yes Leone Brand, NP  furosemide (LASIX) 80 MG tablet Take 80 mg by mouth See admin instructions. Take 80 mg 4 times a week on non-dialysis days 01/30/23  Yes [provider]  hydrALAZINE (APRESOLINE) 25 MG tablet Take 1 tablet (25 mg total) by mouth 3 (three) times daily. 11/08/22  Yes Johnson, Clanford L, MD  isosorbide mononitrate (IMDUR) 30 MG 24 hr tablet Take 0.5 tablets (15 mg  total) by mouth daily. 11/09/22  Yes Johnson, Clanford L, MD  traZODone (DESYREL) 100 MG tablet Take 100 mg by mouth at bedtime.   Yes [provider]      Allergies    Patient has no known allergies.    Review of Systems   Review of Systems  Respiratory:  Positive for shortness of breath.     Physical Exam Updated Vital Signs BP (!) 156/106 (BP Location: Right Arm)   Pulse 99   Temp 98.8 F (37.1 C) (Oral)   Resp 20   Wt 72.5 kg   SpO2 95%   BMI 24.66 kg/m  Physical Exam Vitals and nursing note reviewed.  Constitutional:      General: He is not in acute distress.    Appearance: He is well-developed.  HENT:     Head: Normocephalic and atraumatic.  Eyes:     Conjunctiva/sclera: Conjunctivae normal.  Cardiovascular:     Rate and Rhythm: Normal rate and regular rhythm.     Heart sounds: No murmur heard. Pulmonary:     Effort: Pulmonary effort is normal. No respiratory distress.     Breath sounds: Normal breath sounds.  Abdominal:     Palpations: Abdomen is soft.     Tenderness: There is no abdominal tenderness.  Musculoskeletal:        General: No swelling.     Cervical back: Neck supple.  Right lower leg: No tenderness. Edema present.     Left lower leg: No tenderness. Edema present.  Skin:    General: Skin is warm and dry.     Capillary Refill: Capillary refill takes less than 2 seconds.  Neurological:     General: No focal deficit present.     Mental Status: He is alert and oriented to person, place, and time.  Psychiatric:        Mood and Affect: Mood normal.     ED Results / Procedures / Treatments   Labs (all labs ordered are listed, but only abnormal results are displayed) Labs Reviewed  BASIC METABOLIC PANEL - Abnormal; Notable for the following components:      Result Value   BUN 50 (*)    Creatinine, Ser 9.17 (*)    Calcium 7.7 (*)    GFR, Estimated 6 (*)    All other components within normal limits  CBC - Abnormal; Notable for the  following components:   RBC 2.78 (*)    Hemoglobin 8.4 (*)    HCT 25.8 (*)    All other components within normal limits  TROPONIN I (HIGH SENSITIVITY) - Abnormal; Notable for the following components:   Troponin I (High Sensitivity) 42 (*)    All other components within normal limits  TROPONIN I (HIGH SENSITIVITY) - Abnormal; Notable for the following components:   Troponin I (High Sensitivity) 42 (*)    All other components within normal limits    EKG EKG Interpretation Date/Time:  Sunday February 05 2023 13:36:54 EDT Ventricular Rate:  102 PR Interval:  174 QRS Duration:  88 QT Interval:  378 QTC Calculation: 492 R Axis:   37  Text Interpretation: Sinus tachycardia Possible Left atrial enlargement Left ventricular hypertrophy with repolarization abnormality ( Sokolow-Lyon , Cornell product , Romhilt-Estes ) Abnormal ECG When compared with ECG of 27-Jan-2023 19:45, PREVIOUS ECG IS PRESENT Confirmed by Eber Hong ([id number]) on 02/05/2023 4:29:54 PM  Radiology DG Chest 2 View  Result Date: 02/05/2023 CLINICAL DATA:  Shortness of breath EXAM: CHEST - 2 VIEW COMPARISON:  01/27/2023 FINDINGS: Stable right IJ large-bore catheter with tip at the SVC right atrial junction. Slightly enlarged heart. Small effusions. Mild interstitial edema. No consolidation or pneumothorax. IMPRESSION: Enlarged heart with tiny effusions and mild edema. Right IJ large-bore double-lumen catheter Electronically Signed   By: Karen Kays M.D.   On: 02/05/2023 14:11    Procedures Procedures    Medications Ordered in ED Medications - No data to display  ED Course/ Medical Decision Making/ A&P                             Medical Decision Making This patient presents to the ED for concern of bilateral leg swelling, mild shortness of breath, this involves an extensive number of treatment options, and is a complaint that carries with it a high risk of complications and morbidity.  The differential diagnosis  includes fluid overload, COPD exacerbation, symptomatic anemia, DVT, PE, other   Co morbidities that complicate the patient evaluation ESRD on HD, Cardiomyopathy   Additional history obtained:  Additional history obtained from EMR External records from outside source obtained and reviewed including notes, labs   Lab Tests:  I Ordered, and personally interpreted labs.  The pertinent results include: Troponin 0, renal function is at baseline, potassium is normal, hemoglobin 8.4 is at patient's baseline   Imaging Studies ordered:  I  ordered imaging studies including chest x-ray I independently visualized and interpreted imaging which showed mild interstitial edema I agree with the radiologist interpretation     Problem List / ED Course / Critical interventions / Medication management  Peripheral edema-patient has been cutting his dialysis sessions an hour short and has some increased swelling in his legs with some mild shortness of breath but sats are 95% on room air here, he has no chest pain, delta troponin is 0, EKG reassuring, only mild edema on his chest x-ray.  He had a recent admission for the same and had received dialysis and was instructed to make sure he gets the full 4 hours of dialysis.   Discussed with patient lab and x-ray findings and need for full dialysis sessions.  He verbalized understanding.  He wants to go home at this time, states he is feeling well.  Plans to call dialysis to see if they have an open chair tomorrow to get an extra session as he has done this before, if not he will plan to make sure he states for the full session on Tuesday as scheduled.  He states he is going to make more of an effort to complete his dialysis sessions so he does not get overloaded with fluid again.  He was given follow-up with primary care and nephrology and instructed to come back to the ER if he gets any new or worsening symptoms  I have reviewed the patients home medicines and  have made adjustments as needed      Amount and/or Complexity of Data Reviewed Labs: ordered. Radiology: ordered.           Final Clinical Impression(s) / ED Diagnoses Final diagnoses:  Bilateral lower extremity edema    Rx / DC Orders ED Discharge Orders     None         Josem Kaufmann 02/05/23 2203    Eber Hong, MD 02/06/23 1131

## 2023-02-05 NOTE — ED Triage Notes (Signed)
Pt c/o of SOB and bilateral lower extremity swelling started last night. Pt went to dialysis yesterday.

## 2023-02-05 NOTE — ED Triage Notes (Signed)
Pt stated that he was seen earlier today for this same problem. Pt stated that after DC, he felt fine until nightfall and then wasn't able to breathe normally. Pt satting at 83 on RA upon arrival

## 2023-02-05 NOTE — ED Provider Triage Note (Signed)
Emergency Medicine Provider Triage Evaluation Note  Damon Williams , a 62 y.o. male  was evaluated in triage.  Pt complains of bilateral lower extremity swelling with increased shortness of breath since last night around midnight.  Patient has ESRD on dialysis Tuesday Thursday Saturday.  States he had 3-hour dialysis session yesterday where they pulled off 1-1/2 L.  Review of Systems  Positive: Sob, edema Negative: Chest pain  Physical Exam  BP (!) 156/106 (BP Location: Right Arm)   Pulse 99   Temp 98.8 F (37.1 C) (Oral)   Resp 20   Wt 72.5 kg   SpO2 95%   BMI 24.66 kg/m  Gen:   Awake, no distress   Resp:  Normal effort  MSK:   Moves extremities without difficulty  Other:  Bilateral LE edema, symmetric  Medical Decision Making  Medically screening exam initiated at 2:46 PM.  Appropriate orders placed.  Damon Williams was informed that the remainder of the evaluation will be completed by another provider, this initial triage assessment does not replace that evaluation, and the importance of remaining in the ED until their evaluation is complete.     Damon Williams, [place] 02/05/23 1449

## 2023-02-05 NOTE — Discharge Instructions (Signed)
It was a pleasure take care of you today.  Your workup was reassuring.  As discussed make sure you are doing the full 4-hour dialysis sessions.  Come back to the ER if you have new or worsening symptoms.

## 2023-02-06 ENCOUNTER — Emergency Department (HOSPITAL_COMMUNITY): Payer: 59

## 2023-02-06 DIAGNOSIS — J810 Acute pulmonary edema: Secondary | ICD-10-CM

## 2023-02-06 DIAGNOSIS — Z992 Dependence on renal dialysis: Secondary | ICD-10-CM

## 2023-02-06 DIAGNOSIS — R0602 Shortness of breath: Secondary | ICD-10-CM | POA: Diagnosis not present

## 2023-02-06 DIAGNOSIS — J984 Other disorders of lung: Secondary | ICD-10-CM | POA: Diagnosis not present

## 2023-02-06 DIAGNOSIS — J90 Pleural effusion, not elsewhere classified: Secondary | ICD-10-CM | POA: Insufficient documentation

## 2023-02-06 DIAGNOSIS — I132 Hypertensive heart and chronic kidney disease with heart failure and with stage 5 chronic kidney disease, or end stage renal disease: Secondary | ICD-10-CM | POA: Diagnosis not present

## 2023-02-06 DIAGNOSIS — J811 Chronic pulmonary edema: Secondary | ICD-10-CM | POA: Insufficient documentation

## 2023-02-06 DIAGNOSIS — I5043 Acute on chronic combined systolic (congestive) and diastolic (congestive) heart failure: Secondary | ICD-10-CM

## 2023-02-06 DIAGNOSIS — Z91158 Patient's noncompliance with renal dialysis for other reason: Secondary | ICD-10-CM | POA: Diagnosis not present

## 2023-02-06 DIAGNOSIS — D631 Anemia in chronic kidney disease: Secondary | ICD-10-CM | POA: Diagnosis not present

## 2023-02-06 DIAGNOSIS — J9601 Acute respiratory failure with hypoxia: Secondary | ICD-10-CM

## 2023-02-06 DIAGNOSIS — F17210 Nicotine dependence, cigarettes, uncomplicated: Secondary | ICD-10-CM | POA: Diagnosis not present

## 2023-02-06 DIAGNOSIS — I10 Essential (primary) hypertension: Secondary | ICD-10-CM | POA: Diagnosis not present

## 2023-02-06 DIAGNOSIS — N186 End stage renal disease: Secondary | ICD-10-CM

## 2023-02-06 DIAGNOSIS — Z636 Dependent relative needing care at home: Secondary | ICD-10-CM | POA: Diagnosis not present

## 2023-02-06 DIAGNOSIS — Z79899 Other long term (current) drug therapy: Secondary | ICD-10-CM | POA: Diagnosis not present

## 2023-02-06 DIAGNOSIS — N2581 Secondary hyperparathyroidism of renal origin: Secondary | ICD-10-CM | POA: Diagnosis not present

## 2023-02-06 DIAGNOSIS — R0902 Hypoxemia: Secondary | ICD-10-CM | POA: Diagnosis not present

## 2023-02-06 DIAGNOSIS — N250 Renal osteodystrophy: Secondary | ICD-10-CM | POA: Diagnosis not present

## 2023-02-06 DIAGNOSIS — R0989 Other specified symptoms and signs involving the circulatory and respiratory systems: Secondary | ICD-10-CM | POA: Diagnosis not present

## 2023-02-06 LAB — PHOSPHORUS: Phosphorus: 6.8 mg/dL — ABNORMAL HIGH (ref 2.5–4.6)

## 2023-02-06 LAB — MAGNESIUM: Magnesium: 1.9 mg/dL (ref 1.7–2.4)

## 2023-02-06 MED ORDER — ISOSORBIDE MONONITRATE ER 30 MG PO TB24
15.0000 mg | ORAL_TABLET | Freq: Every day | ORAL | Status: DC
  Administered 2023-02-06: 15 mg via ORAL
  Filled 2023-02-06 (×2): qty 1

## 2023-02-06 MED ORDER — CARVEDILOL 3.125 MG PO TABS: 6.2500 mg | ORAL_TABLET | Freq: Two times a day (BID) | ORAL | Status: DC

## 2023-02-06 MED ORDER — ACETAMINOPHEN 325 MG PO TABS: 650.0000 mg | ORAL_TABLET | Freq: Four times a day (QID) | ORAL | Status: DC | PRN

## 2023-02-06 MED ORDER — ACETAMINOPHEN 650 MG RE SUPP: 650.0000 mg | Freq: Four times a day (QID) | RECTAL | Status: DC | PRN

## 2023-02-06 MED ORDER — SENNOSIDES-DOCUSATE SODIUM 8.6-50 MG PO TABS
2.0000 | ORAL_TABLET | Freq: Every day | ORAL | Status: DC
  Administered 2023-02-06: 2 via ORAL
  Filled 2023-02-06: qty 2

## 2023-02-06 MED ORDER — AMLODIPINE BESYLATE 5 MG PO TABS: 10.0000 mg | ORAL_TABLET | Freq: Every day | ORAL | Status: DC

## 2023-02-06 MED ORDER — HYDRALAZINE HCL 25 MG PO TABS
25.0000 mg | ORAL_TABLET | Freq: Three times a day (TID) | ORAL | Status: DC
  Administered 2023-02-06 (×4): 25 mg via ORAL
  Filled 2023-02-06 (×5): qty 1

## 2023-02-06 MED ORDER — HEPARIN SODIUM (PORCINE) 5000 UNIT/ML IJ SOLN
5000.0000 [IU] | Freq: Three times a day (TID) | INTRAMUSCULAR | Status: DC
  Administered 2023-02-06: 5000 [IU] via SUBCUTANEOUS
  Filled 2023-02-06 (×3): qty 1

## 2023-02-06 MED ORDER — HYDRALAZINE HCL 25 MG PO TABS: 25.0000 mg | ORAL_TABLET | Freq: Three times a day (TID) | ORAL | Status: DC

## 2023-02-06 MED ORDER — MELATONIN 3 MG PO TABS
6.0000 mg | ORAL_TABLET | Freq: Once | ORAL | Status: AC
  Administered 2023-02-06: 6 mg via ORAL
  Filled 2023-02-06: qty 2

## 2023-02-06 MED ORDER — CALCIUM ACETATE (PHOS BINDER) 667 MG PO CAPS
667.0000 mg | ORAL_CAPSULE | Freq: Three times a day (TID) | ORAL | Status: DC
  Administered 2023-02-06 (×3): 667 mg via ORAL
  Filled 2023-02-06 (×4): qty 1

## 2023-02-06 MED ORDER — LABETALOL HCL 5 MG/ML IV SOLN
5.0000 mg | Freq: Four times a day (QID) | INTRAVENOUS | Status: DC | PRN
  Administered 2023-02-06 – 2023-02-07 (×2): 5 mg via INTRAVENOUS
  Filled 2023-02-06 (×2): qty 4

## 2023-02-06 MED ORDER — ONDANSETRON HCL 4 MG PO TABS: 4.0000 mg | ORAL_TABLET | Freq: Four times a day (QID) | ORAL | Status: DC | PRN

## 2023-02-06 MED ORDER — POLYETHYLENE GLYCOL 3350 17 G PO PACK
17.0000 g | PACK | Freq: Every day | ORAL | Status: DC
  Administered 2023-02-06: 17 g via ORAL
  Filled 2023-02-06 (×2): qty 1

## 2023-02-06 MED ORDER — AMLODIPINE BESYLATE 5 MG PO TABS
10.0000 mg | ORAL_TABLET | Freq: Every day | ORAL | Status: DC
  Administered 2023-02-06 – 2023-02-07 (×2): 10 mg via ORAL
  Filled 2023-02-06 (×2): qty 2

## 2023-02-06 MED ORDER — CHLORHEXIDINE GLUCONATE CLOTH 2 % EX PADS: 6.0000 | MEDICATED_PAD | Freq: Every day | CUTANEOUS | Status: DC

## 2023-02-06 MED ORDER — ONDANSETRON HCL 4 MG/2ML IJ SOLN: 4.0000 mg | Freq: Four times a day (QID) | INTRAMUSCULAR | Status: DC | PRN

## 2023-02-06 MED ORDER — CARVEDILOL 3.125 MG PO TABS
6.2500 mg | ORAL_TABLET | Freq: Two times a day (BID) | ORAL | Status: DC
  Administered 2023-02-06 (×2): 6.25 mg via ORAL
  Filled 2023-02-06 (×3): qty 2

## 2023-02-06 NOTE — TOC Initial Note (Signed)
Transition of Care [facility]) - Initial/Assessment Note    Patient Details  Name: Damon Williams MRN: [id number] Date of Birth: 1961/02/08  Transition of [facility]) CM/SW Contact:    Karn Cassis, LCSW Phone Number: 02/06/2023, 7:59 AM  Clinical Narrative: Pt admitted with acute respiratory failure with hypoxia. Assessment completed due to high risk readmission score. Pt reports he lives with his parents. He is independent with ADLs. No home health prior to admission. Pt plans to return home when medically stable. He is currently on 5L O2. Will monitor for home O2 needs. TOC will continue to follow.                   Expected Discharge Plan: Home/Self Care Barriers to Discharge: Continued Medical Work up   Patient Goals and CMS Choice Patient states their goals for this hospitalization and ongoing recovery are:: return home   Choice offered to / list presented to : Patient [place] ownership interest in [facility].provided to::  (n/a)    Expected Discharge Plan and Services In-house Referral: Clinical Social Work     Living arrangements for the past 2 months: Single Family Home                                      Prior Living Arrangements/Services Living arrangements for the past 2 months: Single Family Home Lives with:: Parents Patient language and need for interpreter reviewed:: Yes Do you feel safe going back to the place where you live?: Yes      Need for Family Participation in Patient Care: No (Comment)     Criminal Activity/Legal Involvement Pertinent to Current Situation/Hospitalization: No - Comment as needed  Activities of Daily Living      Permission Sought/Granted                  Emotional Assessment     Affect (typically observed): Appropriate Orientation: : Oriented to Self, Oriented to Place, Oriented to  Time, Oriented to Situation Alcohol / Substance Use: Not Applicable Psych Involvement: No  (comment)  Admission diagnosis:  Hypoxia [R09.02] Acute respiratory failure with hypoxia (HCC) [J96.01] Other hypervolemia [E87.79] Patient Active Problem List   Diagnosis Date Noted   Pulmonary edema 02/06/2023   Pleural effusion on right 02/06/2023   CHF exacerbation (HCC) 01/28/2023   Acute on chronic combined systolic and diastolic CHF (congestive heart failure) (HCC) 01/28/2023   Acute respiratory failure with hypoxia (HCC) 01/27/2023   Uremia 12/05/2022   ESRD on hemodialysis (HCC) 12/05/2022   AKI (acute kidney injury) (HCC) 12/04/2022   Hyperkalemia 12/04/2022   Metabolic acidosis 12/04/2022   Cardiomyopathy (HCC) 11/05/2022   Acute renal failure (ARF) (HCC) 11/04/2022   Cardiomegaly 11/04/2022   Uncontrolled hypertension 11/04/2022   Anemia in chronic kidney disease (CKD) 11/04/2022   CKD (chronic kidney disease) 11/04/2022   Current smoker 11/04/2022   PCP:  Kymoni Lesperance Pacer, NP Pharmacy:   [facility] [phone] - [place], [place] - [address] AT [facility] [address] [place] [address] Phone: [phone] Fax: [phone]     Social Determinants of Health (SDOH) Social History: SDOH Screenings   Food Insecurity: No Food Insecurity (01/28/2023)  Housing: Patient Declined (01/28/2023)  Transportation Needs: No Transportation Needs (01/28/2023)  Utilities: Not At Risk (01/28/2023)  Tobacco Use: High Risk (02/05/2023)   SDOH Interventions:     Readmission Risk Interventions  02/06/2023    7:57 AM 12/05/2022   10:54 AM  Readmission Risk Prevention Plan  Transportation Screening Complete Complete  Home Care Screening  Complete  Medication Review (RN CM)  Complete  HRI or Home Care Consult Complete   Social Work Consult for Recovery Care Planning/Counseling Complete   Palliative Care Screening Not Applicable   Medication Review Oceanographer) Complete

## 2023-02-06 NOTE — Progress Notes (Signed)
Patient seen and evaluated, chart reviewed, please see EMR for updated orders. Please see full H&P dictated by admitting physician Dr Thomes Dinning for same date of service.   Brief Summary:- 62 y.o. male with medical history significant of hypertension, cardiomyopathy, ESRD on HD (TTS) admitted on 02/06/2023 with acute hypoxic respiratory failure in the setting of volume overload and ESRD patient   A/p 1) acute hypoxic respiratory failure due to volume overload status in an ESRD patient -Awaiting hemodialysis to help with volume status -In the meantime continue supplemental oxygen currently requiring 4 to 5 L of oxygen via nasal cannula -May use BiPAP if needed  2)HFrEF--history of combined chronic systolic and diastolic dysfunction CHF with echo from April 2024 showing EF of 30 to 35% and grade 2 diastolic dysfunction -Patient presenting with volume overload status and hypoxia -Continue to use hemodialysis sessions to address volume status  3)ESRD--- nephrology consult requested for hemodialysis to address #1 and #2 above -Patient really does not make much urine so diuretics would not be effective  4)HTN--- continue PTA BP meds -IV labetalol as needed   -Total care time 53 minutes - Patient seen and evaluated, chart reviewed, please see EMR for updated orders. Please see full H&P dictated by admitting physician Dr Thomes Dinning for same date of service.   Shon Hale, MD

## 2023-02-06 NOTE — H&P (Signed)
History and Physical    Patient: Damon Williams:[id number] DOB: 1960-07-20 DOA: 02/05/2023 DOS: the patient was seen and examined on 02/06/2023 PCP: Kara Pacer, NP  Patient coming from: Home  Chief Complaint:  Chief Complaint  Patient presents with   Shortness of Breath   HPI: Damon Williams is a 62 y.o. male with medical history significant of hypertension, cardiomyopathy, ESRD on HD (TTS) who presents to the emergency department due to worsening shortness of breath and bilateral leg swelling which started on Saturday evening (7/27).  Last dialysis was on Saturday and patient only did 3 hours instead of 4 hours.  Patient states that he presented to the ED yesterday due to similar symptoms, he was evaluated and discharged home.  On returning home, he had difficulty in being able to lay flat due to worsening shortness of breath, symptoms continued today, so he decided to go to the ED for further evaluation and management.  Patient states that he has not been very compliant with diet and he has been encouraged to complete full 4-hour session of dialysis by his nephrologist, he has difficulty in doing this due to caring for his elderly parents.  ED Course:  In the emergency department, BP was 170/108, pulse 102 bpm, O2 sat was 93% on supplemental oxygen at 4 LPM, other vital signs were within normal range.  Workup in the ED showed normocytic anemia, BMP was normal except for blood glucose of 110, BUN 57, creatinine 10.26, EGFR 5, troponin x 2 was flat at 42.  Chest x-ray showed cardiomegaly with pulmonary vascular congestion.  Increased patchy airspace disease in the mid to lower lung fields bilaterally, suggesting edema.  Small right pleural effusion. Hospitalist was asked to admit patient for further evaluation and management.  Review of Systems: Review of systems as noted in the HPI. All other systems reviewed and are negative.   Past Medical History:  Diagnosis Date    Cardiomyopathy (HCC)    CKD    Cocaine use    ESRD (end stage renal disease) on dialysis (HCC) 12/05/2022   Hypertension    Tobacco abuse    Past Surgical History:  Procedure Laterality Date   AV FISTULA PLACEMENT Left 12/27/2022   Procedure: INSERTION OF LEFT ARM  ARTERIOVENOUS FISTULA;  Surgeon: Larina Earthly, MD;  Location: [facility];  Service: Vascular;  Laterality: Left;   BACK SURGERY     IR FLUORO GUIDE CV LINE RIGHT  12/07/2022   IR US GUIDE VASC ACCESS RIGHT  12/07/2022    Social History:  reports that he has been smoking cigarettes. He has a 30 pack-year smoking history. He quit smokeless tobacco use about 4 months ago. He reports that he does not currently use alcohol. He reports current drug use. Drug: Codeine.   No Known Allergies  Family History  Problem Relation Age of Onset   Heart disease Neg Hx      Prior to Admission medications   Medication Sig Start Date End Date Taking? Authorizing Provider  amLODipine (NORVASC) 10 MG tablet Take 1 tablet (10 mg total) by mouth daily. 11/09/22   Johnson, Clanford L, MD  calcium acetate (PHOSLO) 667 MG capsule Take 667 mg by mouth 3 (three) times daily with meals.    [provider]  carvedilol (COREG) 6.25 MG tablet Take 1 tablet (6.25 mg total) by mouth 2 (two) times daily with a meal. 01/20/23   Leone Brand, NP  furosemide (LASIX) 80 MG tablet  Take 80 mg by mouth See admin instructions. Take 80 mg 4 times a week on non-dialysis days 01/30/23   [provider]  hydrALAZINE (APRESOLINE) 25 MG tablet Take 1 tablet (25 mg total) by mouth 3 (three) times daily. 11/08/22   Johnson, Clanford L, MD  isosorbide mononitrate (IMDUR) 30 MG 24 hr tablet Take 0.5 tablets (15 mg total) by mouth daily. 11/09/22   Johnson, Clanford L, MD  traZODone (DESYREL) 100 MG tablet Take 100 mg by mouth at bedtime.    [provider]    Physical Exam: BP (!) 167/102   Pulse 95   Temp 97.7 F (36.5 C) (Oral)   Resp (!) 25    SpO2 95%   General: 62 y.o. year-old male well developed well nourished in no acute distress.  Alert and oriented x3. HEENT: NCAT, EOMI Neck: Supple, trachea medial Cardiovascular: Tachycardia.  Regular rate and rhythm with no rubs or gallops.  No thyromegaly or JVD noted.  Bilateral lower extremity edema. 2/4 pulses in all 4 extremities. Respiratory: Bilateral Rales in lower lobes on auscultation with no wheezes.   Abdomen: Soft, nontender nondistended with normal bowel sounds x4 quadrants. Muskuloskeletal: No cyanosis, clubbing  noted bilaterally Neuro: CN II-XII intact, strength 5/5 x 4, sensation, reflexes intact Skin: No ulcerative lesions noted or rashes Psychiatry: Judgement and insight appear normal. Mood is appropriate for condition and setting          Labs on Admission:  Basic Metabolic Panel: Recent Labs  Lab 02/05/23 1352 02/06/23 0017  NA 135 136  K 3.8 3.7  CL 102 101  CO2 28 24  GLUCOSE 82 110*  BUN 50* 57*  CREATININE 9.17* 10.26*  CALCIUM 7.7* 7.6*   Liver Function Tests: No results for input(s): "AST", "ALT", "ALKPHOS", "BILITOT", "PROT", "ALBUMIN" in the last 168 hours. No results for input(s): "LIPASE", "AMYLASE" in the last 168 hours. No results for input(s): "AMMONIA" in the last 168 hours. CBC: Recent Labs  Lab 02/05/23 1352 02/06/23 0017  WBC 4.9 5.1  NEUTROABS  --  3.3  HGB 8.4* 8.7*  HCT 25.8* 26.5*  MCV 92.8 93.3  PLT 288 296   Cardiac Enzymes: No results for input(s): "CKTOTAL", "CKMB", "CKMBINDEX", "TROPONINI" in the last 168 hours.  BNP (last 3 results) Recent Labs    11/05/22 0356 12/04/22 1800 01/27/23 2010  BNP 564.0* 1,433.0* 1,945.0*    ProBNP (last 3 results) No results for input(s): "PROBNP" in the last 8760 hours.  CBG: No results for input(s): "GLUCAP" in the last 168 hours.  Radiological Exams on Admission: DG Chest Portable 1 View  Result Date: 02/06/2023 CLINICAL DATA:  Shortness of breath. EXAM: PORTABLE  CHEST 1 VIEW COMPARISON:  01/28/2023. FINDINGS: The heart is enlarged and the mediastinal contour stable. The pulmonary vasculature is distended. Atherosclerotic calcification of the aorta is noted. Patchy airspace disease is noted in the mid to lower lung fields bilaterally, increased from the prior exam. There is a small right pleural effusion. No pneumothorax. A right central venous catheter is unchanged. No acute osseous abnormality. IMPRESSION: 1. Cardiomegaly with pulmonary vascular congestion. 2. Increased patchy airspace disease in the mid to lower lung fields bilaterally, suggesting edema. 3. Small right pleural effusion. Electronically Signed   By: Thornell Sartorius M.D.   On: 02/06/2023 00:22   DG Chest 2 View  Result Date: 02/05/2023 CLINICAL DATA:  Shortness of breath EXAM: CHEST - 2 VIEW COMPARISON:  01/27/2023 FINDINGS: Stable right IJ large-bore catheter with  tip at the SVC right atrial junction. Slightly enlarged heart. Small effusions. Mild interstitial edema. No consolidation or pneumothorax. IMPRESSION: Enlarged heart with tiny effusions and mild edema. Right IJ large-bore double-lumen catheter Electronically Signed   By: Karen Kays M.D.   On: 02/05/2023 14:11    EKG: I independently viewed the EKG done and my findings are as followed: Sinus tachycardia at a rate of 101 beats per minutes.  LVH with secondary repolarization  Assessment/Plan Present on Admission:  Acute respiratory failure with hypoxia (HCC)  Acute on chronic combined systolic and diastolic CHF (congestive heart failure) (HCC)  Uncontrolled hypertension  Principal Problem:   Acute respiratory failure with hypoxia (HCC) Active Problems:   Uncontrolled hypertension   ESRD on hemodialysis (HCC)   Acute on chronic combined systolic and diastolic CHF (congestive heart failure) (HCC)   Pulmonary edema   Pleural effusion on right  Acute respiratory failure with hypoxia possibly due to fluid overload Continue  supplemental oxygen to maintain O2 sats > 92% with plan to wean patient off this as tolerated Nephrology will be consulted for dialysis in the morning  Acute on chronic combined HFrEF and HFpEF Pulmonary edema Small right pleural effusion Chest x-ray suggestive of pulmonary edema and small right pleural effusion Echocardiogram done on in April 2024 showed LVEF of 30 to 35%.  LV demonstrates global hypokinesis.  Mild concentric LVH.  G2 DD Continue total input/output, daily weights and fluid restriction Continue heart healthy diet  Nephrology will be consulted for dialysis, shall await further recommendations  ESRD on HD (TTS) Though patient is not due for dialysis until Tuesday, it will be necessary for patient to have dialysis this morning (Monday) due to being fluid overloaded and not requiring supplemental oxygen. Continue PhosLo Nephrology was consulted as described above  Uncontrolled hypertension Continue amlodipine, Coreg, hydralazine, Imdur  DVT prophylaxis: Heparin subcu   Advance Care Planning: Full code  Consults: Nephrology  Family Communication: None at bedside  Severity of Illness: The appropriate patient status for this patient is INPATIENT. Inpatient status is judged to be reasonable and necessary in order to provide the required intensity of service to ensure the patient's safety. The patient's presenting symptoms, physical exam findings, and initial radiographic and laboratory data in the context of their chronic comorbidities is felt to place them at high risk for further clinical deterioration. Furthermore, it is not anticipated that the patient will be medically stable for discharge from the hospital within 2 midnights of admission.   * I certify that at the point of admission it is my clinical judgment that the patient will require inpatient hospital care spanning beyond 2 midnights from the point of admission due to high intensity of service, high risk for  further deterioration and high frequency of surveillance required.*  Author: Frankey Shown, DO 02/06/2023 4:11 AM  For on call review [email or web address].

## 2023-02-06 NOTE — ED Provider Notes (Signed)
[place] EMERGENCY DEPARTMENT AT [facility] Provider Note   CSN: [id number] Arrival date & time: 02/05/23  2336     History  Chief Complaint  Patient presents with   Shortness of Breath    Damon Williams is a 62 y.o. male.  HPI     This is a 62 year old male with a history of cardiomyopathy, end-stage renal disease on dialysis Tuesday, Thursday, Saturday and hypertension who presents with lower extremity swelling and worsening shortness of breath.  He was seen and evaluated yesterday for the same.  He did have a dialysis session on Saturday but only did 3 hours.  He is supposed to do 4.  Evaluation yesterday was largely unremarkable.  Patient states he went home and when he tried to go to bed he had acute onset of worsening shortness of breath.  No recent cough, fevers, chills.  Denies chest pain.  Patient reports that he spoke to his nephrologist to has encouraged him to complete a full 4 hours.  He states he has difficulty doing this secondary to caring for his elderly parents.  Home Medications Prior to Admission medications   Medication Sig Start Date End Date Taking? Authorizing Provider  amLODipine (NORVASC) 10 MG tablet Take 1 tablet (10 mg total) by mouth daily. 11/09/22   Johnson, Clanford L, MD  calcium acetate (PHOSLO) 667 MG capsule Take 667 mg by mouth 3 (three) times daily with meals.    [provider]  carvedilol (COREG) 6.25 MG tablet Take 1 tablet (6.25 mg total) by mouth 2 (two) times daily with a meal. 01/20/23   Leone Brand, NP  furosemide (LASIX) 80 MG tablet Take 80 mg by mouth See admin instructions. Take 80 mg 4 times a week on non-dialysis days 01/30/23   [provider]  hydrALAZINE (APRESOLINE) 25 MG tablet Take 1 tablet (25 mg total) by mouth 3 (three) times daily. 11/08/22   Johnson, Clanford L, MD  isosorbide mononitrate (IMDUR) 30 MG 24 hr tablet Take 0.5 tablets (15 mg total) by mouth daily. 11/09/22   Johnson, Clanford L,  MD  traZODone (DESYREL) 100 MG tablet Take 100 mg by mouth at bedtime.    [provider]      Allergies    Patient has no known allergies.    Review of Systems   Review of Systems  Constitutional:  Negative for fever.  Respiratory:  Positive for shortness of breath.   Cardiovascular:  Positive for leg swelling. Negative for chest pain.  All other systems reviewed and are negative.   Physical Exam Updated Vital Signs BP (!) 178/108 (BP Location: Right Arm)   Pulse 93   Temp 97.7 F (36.5 C) (Oral)   Resp (!) 27   SpO2 91%  Physical Exam Vitals and nursing note reviewed.  Constitutional:      Appearance: He is well-developed.     Comments: Chronically ill-appearing  HENT:     Head: Normocephalic and atraumatic.  Eyes:     Pupils: Pupils are equal, round, and reactive to light.  Cardiovascular:     Rate and Rhythm: Normal rate and regular rhythm.     Heart sounds: Normal heart sounds. No murmur heard. Pulmonary:     Effort: No respiratory distress.     Breath sounds: Examination of the right-lower field reveals rales. Examination of the left-lower field reveals rales. Rales present. No wheezing.     Comments: Tachypnea, speaking in short sentences Abdominal:  General: Bowel sounds are normal.     Palpations: Abdomen is soft.     Tenderness: There is no abdominal tenderness. There is no rebound.  Musculoskeletal:     Cervical back: Neck supple.     Right lower leg: Edema present.     Left lower leg: Edema present.  Lymphadenopathy:     Cervical: No cervical adenopathy.  Skin:    General: Skin is warm and dry.  Neurological:     Mental Status: He is alert and oriented to person, place, and time.  Psychiatric:        Mood and Affect: Mood normal.     ED Results / Procedures / Treatments   Labs (all labs ordered are listed, but only abnormal results are displayed) Labs Reviewed  CBC WITH DIFFERENTIAL/PLATELET - Abnormal; Notable for the following  components:      Result Value   RBC 2.84 (*)    Hemoglobin 8.7 (*)    HCT 26.5 (*)    All other components within normal limits  BASIC METABOLIC PANEL - Abnormal; Notable for the following components:   Glucose, Bld 110 (*)    BUN 57 (*)    Creatinine, Ser 10.26 (*)    Calcium 7.6 (*)    GFR, Estimated 5 (*)    All other components within normal limits    EKG None  Radiology DG Chest Portable 1 View  Result Date: 02/06/2023 CLINICAL DATA:  Shortness of breath. EXAM: PORTABLE CHEST 1 VIEW COMPARISON:  01/28/2023. FINDINGS: The heart is enlarged and the mediastinal contour stable. The pulmonary vasculature is distended. Atherosclerotic calcification of the aorta is noted. Patchy airspace disease is noted in the mid to lower lung fields bilaterally, increased from the prior exam. There is a small right pleural effusion. No pneumothorax. A right central venous catheter is unchanged. No acute osseous abnormality. IMPRESSION: 1. Cardiomegaly with pulmonary vascular congestion. 2. Increased patchy airspace disease in the mid to lower lung fields bilaterally, suggesting edema. 3. Small right pleural effusion. Electronically Signed   By: Thornell Sartorius M.D.   On: 02/06/2023 00:22   DG Chest 2 View  Result Date: 02/05/2023 CLINICAL DATA:  Shortness of breath EXAM: CHEST - 2 VIEW COMPARISON:  01/27/2023 FINDINGS: Stable right IJ large-bore catheter with tip at the SVC right atrial junction. Slightly enlarged heart. Small effusions. Mild interstitial edema. No consolidation or pneumothorax. IMPRESSION: Enlarged heart with tiny effusions and mild edema. Right IJ large-bore double-lumen catheter Electronically Signed   By: Karen Kays M.D.   On: 02/05/2023 14:11    Procedures Procedures    Medications Ordered in ED Medications - No data to display  ED Course/ Medical Decision Making/ A&P                             Medical Decision Making Amount and/or Complexity of Data Reviewed Labs:  ordered. Radiology: ordered.  Risk Decision regarding hospitalization.   This patient presents to the ED for concern of shortness of breath, this involves an extensive number of treatment options, and is a complaint that carries with it a high risk of complications and morbidity.  I considered the following differential and admission for this acute, potentially life threatening condition.  The differential diagnosis includes pulmonary edema, CHF, pneumonia, COPD  MDM:    This is a 62 year old male who presents with shortness of breath.  Seen and evaluated for the same yesterday.  Reports  worsening symptoms tonight when he laid down.  He is requiring oxygen as his O2 sats on room air were in the low 80s.  He does not normally wear oxygen at home.  He is tachypneic and speaking in short sentences.  Clinically he appears volume overloaded.  Chest x-ray when compared to prior does indicate worsening pulmonary edema.  Labs are largely at the patient's baseline and potassium is normal.  Given his oxygen requirement, feel he needs admission for emergent dialysis later this morning.  (Labs, imaging, consults)  Labs: I Ordered, and personally interpreted labs.  The pertinent results include: CBC, BMP  Imaging Studies ordered: I ordered imaging studies including chest x-ray I independently visualized and interpreted imaging. I agree with the radiologist interpretation  Additional history obtained from chart review.  External records from outside source obtained and reviewed including prior evaluations  Cardiac Monitoring: The patient was maintained on a cardiac monitor.  If on the cardiac monitor, I personally viewed and interpreted the cardiac monitored which showed an underlying rhythm of: Sinus rhythm  Reevaluation: After the interventions noted above, I reevaluated the patient and found that they have :stayed the same  Social Determinants of Health:  lives independently  Disposition:  Admit  Co morbidities that complicate the patient evaluation  Past Medical History:  Diagnosis Date   Cardiomyopathy (HCC)    CKD    Cocaine use    ESRD (end stage renal disease) on dialysis (HCC) 12/05/2022   Hypertension    Tobacco abuse      Medicines No orders of the defined types were placed in this encounter.   I have reviewed the patients home medicines and have made adjustments as needed  Problem List / ED Course: Problem List Items Addressed This Visit   None Visit Diagnoses     Other hypervolemia    -  Primary   Hypoxia                       Final Clinical Impression(s) / ED Diagnoses Final diagnoses:  Other hypervolemia  Hypoxia    Rx / DC Orders ED Discharge Orders     None         Shon Baton, MD 02/06/23 0117

## 2023-02-06 NOTE — Progress Notes (Signed)
Notified Dr. Thomes Dinning of bp elevated 186/98, HR 104. Pt states he takes coreg, norvasc, hydralazine, and imdur at home and the last time he had them was 02/05/23 at 0700.

## 2023-02-07 DIAGNOSIS — N186 End stage renal disease: Secondary | ICD-10-CM | POA: Diagnosis not present

## 2023-02-07 DIAGNOSIS — I120 Hypertensive chronic kidney disease with stage 5 chronic kidney disease or end stage renal disease: Secondary | ICD-10-CM | POA: Diagnosis not present

## 2023-02-07 DIAGNOSIS — D631 Anemia in chronic kidney disease: Secondary | ICD-10-CM | POA: Diagnosis not present

## 2023-02-07 DIAGNOSIS — N250 Renal osteodystrophy: Secondary | ICD-10-CM | POA: Diagnosis not present

## 2023-02-07 DIAGNOSIS — I5043 Acute on chronic combined systolic (congestive) and diastolic (congestive) heart failure: Secondary | ICD-10-CM | POA: Diagnosis not present

## 2023-02-07 DIAGNOSIS — R0602 Shortness of breath: Secondary | ICD-10-CM | POA: Diagnosis not present

## 2023-02-07 DIAGNOSIS — J9601 Acute respiratory failure with hypoxia: Secondary | ICD-10-CM | POA: Diagnosis not present

## 2023-02-07 DIAGNOSIS — Z992 Dependence on renal dialysis: Secondary | ICD-10-CM | POA: Diagnosis not present

## 2023-02-07 LAB — PHOSPHORUS: Phosphorus: 7.3 mg/dL — ABNORMAL HIGH (ref 2.5–4.6)

## 2023-02-07 MED ORDER — CARVEDILOL 12.5 MG PO TABS: 12.5000 mg | ORAL_TABLET | Freq: Two times a day (BID) | ORAL | 5 refills | Status: DC

## 2023-02-07 MED ORDER — AMLODIPINE BESYLATE 10 MG PO TABS: 10.0000 mg | ORAL_TABLET | Freq: Every day | ORAL | 3 refills | Status: DC

## 2023-02-07 MED ORDER — ALTEPLASE 2 MG IJ SOLR: 2.0000 mg | Freq: Once | INTRAMUSCULAR | Status: DC | PRN

## 2023-02-07 MED ORDER — LIDOCAINE HCL (PF) 1 % IJ SOLN: 5.0000 mL | INTRAMUSCULAR | Status: DC | PRN

## 2023-02-07 MED ORDER — PENTAFLUOROPROP-TETRAFLUOROETH EX AERO: 1.0000 | INHALATION_SPRAY | CUTANEOUS | Status: DC | PRN

## 2023-02-07 MED ORDER — LIDOCAINE-PRILOCAINE 2.5-2.5 % EX CREA: 1.0000 | TOPICAL_CREAM | CUTANEOUS | Status: DC | PRN

## 2023-02-07 MED ORDER — ANTICOAGULANT SODIUM CITRATE 4% (200MG/5ML) IV SOLN: 5.0000 mL | Status: DC | PRN

## 2023-02-07 MED ORDER — HEPARIN SODIUM (PORCINE) 1000 UNIT/ML DIALYSIS
1000.0000 [IU] | INTRAMUSCULAR | Status: DC | PRN
  Administered 2023-02-07: 1000 [IU]

## 2023-02-07 MED ORDER — ISOSORBIDE MONONITRATE ER 30 MG PO TB24: 15.0000 mg | ORAL_TABLET | Freq: Every day | ORAL | 5 refills | Status: DC

## 2023-02-07 MED ORDER — HYDRALAZINE HCL 25 MG PO TABS: 25.0000 mg | ORAL_TABLET | Freq: Three times a day (TID) | ORAL | 3 refills | Status: DC

## 2023-02-07 NOTE — Discharge Instructions (Signed)
1)Very low-salt diet advised--Less than 2 gm per Day 2)Weigh yourself daily, call if you gain more than 3 pounds in 1 day or more than 5 pounds in 1 week as your diuretic medications may need to be adjusted 3)Limit your Fluid  intake to no more than 60 ounces (1.8 Liters) per day 4)Avoid ibuprofen/Advil/Aleve/Motrin/Goody Powders/Naproxen/BC powders/Meloxicam/Diclofenac/Indomethacin and other Nonsteroidal anti-inflammatory medications as these will make you more likely to bleed and can cause stomach ulcers, can also cause Kidney problems.  5)Continue hemodialysis- on Tuesday/Thursday/Saturday

## 2023-02-07 NOTE — Progress Notes (Signed)
   02/07/23 1300  Vitals  Temp 98 F (36.7 C)  Temp Source Oral  BP (!) 169/93  BP Location Right Wrist  BP Method Automatic  Patient Position (if appropriate) Lying  Pulse Rate 93  Resp (!) 21  Oxygen Therapy  SpO2 100 %  O2 Device Nasal Cannula  O2 Flow Rate (L/min) 3 L/min  During Treatment Monitoring  Intra-Hemodialysis Comments See progress note  Post Treatment  Dialyzer Clearance Lightly streaked  Duration of HD Treatment -hour(s) 3.5 hour(s)  Hemodialysis Intake (mL) 0 mL  Liters Processed 76  Fluid Removed (mL) 2900 mL  Tolerated HD Treatment Yes  Post-Hemodialysis Comments Pt cut off 30 mins d/t bad cramping. Dr. Salena Saner notified.  Hemodialysis Catheter Right Internal jugular Double lumen Permanent (Tunneled)  Placement Date/Time: 12/07/22 0943   Serial / Lot #: [id number]  Expiration Date: 04/16/27  Time Out: Correct patient;Correct site;Correct procedure  Maximum sterile barrier precautions: Hand hygiene;Cap;Mask;Sterile gown;Large sterile sheet  Site Pre...  Site Condition No complications  Blue Lumen Status Heparin locked  Red Lumen Status Heparin locked  Catheter fill solution Heparin 1000 units/ml  Catheter fill volume (Arterial) 1.6 cc  Catheter fill volume (Venous) 1.6  Dressing Type Transparent  Dressing Status Antimicrobial disc in place;Clean, Dry, Intact  Interventions Dressing changed  Drainage Description None  Dressing Change Due 02/14/23  Post treatment catheter status Capped and Clamped

## 2023-02-07 NOTE — Consult Note (Signed)
[facility] Customer service manager [facility]) Accountable Care Organization (ACO) [facility] Liaison Note  02/07/2023  Damon Williams 28-Feb-1961 [id number]  Location: [facility] RN Hospital Liaison screened the patient remotely at [facility].  Insurance: Arts development officer   [name] is a 62 y.o. male who is a Primary Care Patient of Nsumanganyi, Colleen Can, NP. The patient was screened for 7 and 30 day readmission hospitalization with noted extreme risk score for unplanned readmission risk with 4 IP/2 ED in 6 months.  The patient was assessed for potential [facility]) Care Management service needs for post hospital transition for care coordination. Review of patient's electronic medical record reveals patienwas admitted for Hypervolemia. Pt will discharge today with self-care. No needs anticipated.  Hospital liaison check roster for noted provider in EPIC as this is not a provider covered for care management service.  [facility] Care Management/Population Health does not replace or interfere with any arrangements made by the Inpatient Transition of Care team.   For questions contact:   Elliot Cousin, RN, [facility] Liaison [place]   Population Health Office Hours MTWF  8:00 am-6:00 pm Off on Thursday [phone] mobile [phone] [Office toll free line] Office Hours are M-F 8:30 - 5 pm 24 hour nurse advise line [phone] Concierge  [email or web address]

## 2023-02-07 NOTE — Progress Notes (Signed)
Patient taken to dialysis. 

## 2023-02-07 NOTE — Progress Notes (Signed)
Mobility Specialist Progress Note:    02/07/23 1339  Mobility  Activity Contraindicated/medical hold  Mobility Referral Yes   Spoke with nurse, pt experiencing elevated BP. Deferred mobility.   Lawerance Bach Mobility Specialist Please contact via Special educational needs teacher or  Rehab office at [phone]

## 2023-02-07 NOTE — Discharge Summary (Signed)
Damon Williams, is a 62 y.o. male  DOB [birth year]  MRN [id number].  Admission date:  02/05/2023  Admitting Physician  Frankey Shown, DO  Discharge Date:  02/07/2023   Primary MD  Kara Pacer, NP  Recommendations for primary care physician for things to follow:  1)Very low-salt diet advised--Less than 2 gm per Day 2)Weigh yourself daily, call if you gain more than 3 pounds in 1 day or more than 5 pounds in 1 week as your diuretic medications may need to be adjusted 3)Limit your Fluid  intake to no more than 60 ounces (1.8 Liters) per day 4)Avoid ibuprofen/Advil/Aleve/Motrin/Goody Powders/Naproxen/BC powders/Meloxicam/Diclofenac/Indomethacin and other Nonsteroidal anti-inflammatory medications as these will make you more likely to bleed and can cause stomach ulcers, can also cause Kidney problems.  5)Continue hemodialysis- on Tuesday/Thursday/Saturday  Admission Diagnosis  Hypoxia [R09.02] Acute respiratory failure with hypoxia (HCC) [J96.01] Other hypervolemia [E87.79]   Discharge Diagnosis  Hypoxia [R09.02] Acute respiratory failure with hypoxia (HCC) [J96.01] Other hypervolemia [E87.79]    Principal Problem:   Acute respiratory failure with hypoxia (HCC) Active Problems:   Uncontrolled hypertension   ESRD on hemodialysis (HCC)   Acute on chronic combined systolic and diastolic CHF (congestive heart failure) (HCC)   Pulmonary edema   Pleural effusion on right      Past Medical History:  Diagnosis Date   Cardiomyopathy (HCC)    CKD    Cocaine use    ESRD (end stage renal disease) on dialysis (HCC) 12/05/2022   Hypertension    Tobacco abuse     Past Surgical History:  Procedure Laterality Date   AV FISTULA PLACEMENT Left 12/27/2022   Procedure: INSERTION OF LEFT ARM  ARTERIOVENOUS FISTULA;  Surgeon: Larina Earthly, MD;  Location: [facility];  Service: Vascular;  Laterality:  Left;   BACK SURGERY     IR FLUORO GUIDE CV LINE RIGHT  12/07/2022   IR US GUIDE VASC ACCESS RIGHT  12/07/2022    HPI  from the history and physical done on the day of admission:    HPI: Damon Williams is a 62 y.o. male with medical history significant of hypertension, cardiomyopathy, ESRD on HD (TTS) who presents to the emergency department due to worsening shortness of breath and bilateral leg swelling which started on Saturday evening (7/27).  Last dialysis was on Saturday and patient only did 3 hours instead of 4 hours.  Patient states that he presented to the ED yesterday due to similar symptoms, he was evaluated and discharged home.  On returning home, he had difficulty in being able to lay flat due to worsening shortness of breath, symptoms continued today, so he decided to go to the ED for further evaluation and management.  Patient states that he has not been very compliant with diet and he has been encouraged to complete full 4-hour session of dialysis by his nephrologist, he has difficulty in doing this due to caring for his elderly parents.   ED Course:  In the emergency department, BP  was 170/108, pulse 102 bpm, O2 sat was 93% on supplemental oxygen at 4 LPM, other vital signs were within normal range.  Workup in the ED showed normocytic anemia, BMP was normal except for blood glucose of 110, BUN 57, creatinine 10.26, EGFR 5, troponin x 2 was flat at 42.  Chest x-ray showed cardiomegaly with pulmonary vascular congestion.  Increased patchy airspace disease in the mid to lower lung fields bilaterally, suggesting edema.  Small right pleural effusion. Hospitalist was asked to admit patient for further evaluation and management.   Review of Systems: Review of systems as noted in the HPI. All other systems reviewed and are negative.     Hospital Course:    Brief Summary:- 62 y.o. male with medical history significant of hypertension, cardiomyopathy, ESRD on HD (TTS) admitted on 02/06/2023  with acute hypoxic respiratory failure in the setting of volume overload and ESRD patient     A/p 1) acute hypoxic respiratory failure due to volume overload status in an ESRD patient -It appears patient did not have his full dialysis session last Saturday---he signed off from HD machine at that time -required up to 5 L of oxygen via nasal cannula -Hypoxia resolved after hemodialysis on 02/07/2023 -Patient ambulating in hallways without desaturation on room air   2)HFrEF--history of combined chronic systolic and diastolic dysfunction CHF with echo from April 2024 showing EF of 30 to 35% and grade 2 diastolic dysfunction -Patient was admitted with with volume overload status and hypoxia -Continue to use hemodialysis sessions to address volume status -Please see 1 above   3)ESRD--- nephrology consult requested for hemodialysis to address #1 and #2 above -Patient really does not make much urine so diuretics would not be effective -Continue HD on TTS schedule   4)HTN--- continue PTA BP meds  5) chronic anemia of ESRD--ESA/Procrit per nephrology team -Hgb stable at this time  Discharge Condition: Stable without hypoxia or significant dyspnea on exertion  Follow UP--- nephrologist for HD     Consults obtained -nephrologist for HD  Diet and Activity recommendation:  As advised  Discharge Instructions    Discharge Instructions     Ambulatory Referral for Lung Cancer Scre   Complete by: As directed    Call MD for:  difficulty breathing, headache or visual disturbances   Complete by: As directed    Call MD for:  persistant dizziness or light-headedness   Complete by: As directed    Call MD for:  persistant nausea and vomiting   Complete by: As directed    Call MD for:  temperature >100.4   Complete by: As directed    Diet - low sodium heart healthy   Complete by: As directed    Discharge instructions   Complete by: As directed    1)Very low-salt diet advised--Less than 2 gm  per Day 2)Weigh yourself daily, call if you gain more than 3 pounds in 1 day or more than 5 pounds in 1 week as your diuretic medications may need to be adjusted 3)Limit your Fluid  intake to no more than 60 ounces (1.8 Liters) per day 4)Avoid ibuprofen/Advil/Aleve/Motrin/Goody Powders/Naproxen/BC powders/Meloxicam/Diclofenac/Indomethacin and other Nonsteroidal anti-inflammatory medications as these will make you more likely to bleed and can cause stomach ulcers, can also cause Kidney problems.  5)Continue hemodialysis- on Tuesday/Thursday/Saturday   Increase activity slowly   Complete by: As directed    No wound care   Complete by: As directed          Discharge Medications  Allergies as of 02/07/2023   No Known Allergies      Medication List     TAKE these medications    amLODipine 10 MG tablet Commonly known as: NORVASC Take 1 tablet (10 mg total) by mouth daily.   carvedilol 12.5 MG tablet Commonly known as: COREG Take 1 tablet (12.5 mg total) by mouth 2 (two) times daily with a meal. What changed:  medication strength how much to take   furosemide 80 MG tablet Commonly known as: LASIX Take 80 mg by mouth See admin instructions. Take 80 mg 4 times a week on non-dialysis days   hydrALAZINE 25 MG tablet Commonly known as: APRESOLINE Take 1 tablet (25 mg total) by mouth 3 (three) times daily.   isosorbide mononitrate 30 MG 24 hr tablet Commonly known as: IMDUR Take 0.5 tablets (15 mg total) by mouth daily.   PhosLo 667 MG capsule Generic drug: calcium acetate Take 667 mg by mouth 3 (three) times daily with meals.   traZODone 100 MG tablet Commonly known as: DESYREL Take 100 mg by mouth at bedtime.        Major procedures and Radiology Reports - PLEASE review detailed and final reports for all details, in brief -   DG Chest Portable 1 View  Result Date: 02/06/2023 CLINICAL DATA:  Shortness of breath. EXAM: PORTABLE CHEST 1 VIEW COMPARISON:   01/28/2023. FINDINGS: The heart is enlarged and the mediastinal contour stable. The pulmonary vasculature is distended. Atherosclerotic calcification of the aorta is noted. Patchy airspace disease is noted in the mid to lower lung fields bilaterally, increased from the prior exam. There is a small right pleural effusion. No pneumothorax. A right central venous catheter is unchanged. No acute osseous abnormality. IMPRESSION: 1. Cardiomegaly with pulmonary vascular congestion. 2. Increased patchy airspace disease in the mid to lower lung fields bilaterally, suggesting edema. 3. Small right pleural effusion. Electronically Signed   By: Thornell Sartorius M.D.   On: 02/06/2023 00:22   DG Chest 2 View  Result Date: 02/05/2023 CLINICAL DATA:  Shortness of breath EXAM: CHEST - 2 VIEW COMPARISON:  01/27/2023 FINDINGS: Stable right IJ large-bore catheter with tip at the SVC right atrial junction. Slightly enlarged heart. Small effusions. Mild interstitial edema. No consolidation or pneumothorax. IMPRESSION: Enlarged heart with tiny effusions and mild edema. Right IJ large-bore double-lumen catheter Electronically Signed   By: Karen Kays M.D.   On: 02/05/2023 14:11   DG Chest Portable 1 View  Result Date: 01/27/2023 CLINICAL DATA:  Shortness of breath. EXAM: PORTABLE CHEST 1 VIEW COMPARISON:  Chest radiograph dated 12/04/2022. FINDINGS: Dialysis catheter with tip at the cavoatrial junction. There is cardiomegaly with vascular congestion and edema as well as small bilateral pleural effusions, new since the prior radiograph. No pneumothorax. No acute osseous pathology. IMPRESSION: Cardiomegaly with vascular congestion and edema. Electronically Signed   By: Elgie Collard M.D.   On: 01/27/2023 21:04    Today   Subjective    Kevante Nardini today has no new complaints No fever  Or chills   No Nausea, Vomiting or Diarrhea        Patient has been seen and examined prior to discharge   Objective   Blood pressure  (!) 169/93, pulse 93, temperature 98 F (36.7 C), temperature source Oral, resp. rate (!) 21, weight 67.3 kg, SpO2 97%.   Intake/Output Summary (Last 24 hours) at 02/07/2023 1843 Last data filed at 02/07/2023 1500 Gross per 24 hour  Intake 480 ml  Output 2900 ml  Net -2420 ml    Exam Gen:- Awake Alert, no acute distress , no conversational dyspnea, no significant dyspnea on exertion HEENT:- [place].AT, No sclera icterus Neck-Supple Neck,No JVD,.  Lungs-improved air movement, no rales no wheezing CV- S1, S2 normal, regular Abd-  +ve B.Sounds, Abd Soft, No tenderness,    Extremity/Skin:-Improving pitting and edema,   good pulses Psych-affect is appropriate, oriented x3 Neuro-no new focal deficits, no tremors  MSK-AV fistula with positive thrill and bruit   Data Review   CBC w Diff:  Lab Results  Component Value Date   WBC 4.8 02/07/2023   HGB 8.4 (L) 02/07/2023   HCT 26.0 (L) 02/07/2023   PLT 297 02/07/2023   LYMPHOPCT 18 02/06/2023   MONOPCT 11 02/06/2023   EOSPCT 5 02/06/2023   BASOPCT 1 02/06/2023    CMP:  Lab Results  Component Value Date   NA 137 02/07/2023   K 4.1 02/07/2023   CL 101 02/07/2023   CO2 23 02/07/2023   BUN 75 (H) 02/07/2023   CREATININE 12.47 (H) 02/07/2023   PROT 6.3 (L) 02/07/2023   ALBUMIN 2.7 (L) 02/07/2023   BILITOT 0.4 02/07/2023   ALKPHOS 44 02/07/2023   AST 11 (L) 02/07/2023   ALT 10 02/07/2023  .  Total Discharge time is about 33 minutes  Shon Hale M.D on 02/07/2023 at 6:43 PM  Go to [email or web address] -  for contact info  [facility] - Office  [phone]

## 2023-02-07 NOTE — Consult Note (Signed)
[facility] Renal Consultation Note    Indication for Consultation:  Management of ESRD/hemodialysis; anemia, hypertension/volume and secondary hyperparathyroidism  HPI: Damon Williams is a 62 y.o. male with a PMH significant for HTN, tobacco use, CMP (EF 30-35%, and Grade II DD), and ESRD who presented to [facility] ED on 02/05/23 c/o lower extremity edema and SOB.  In the ED, Temp 97.7, Bp 178/108, HR 93, RR 27, SpO2 91%.  Labs notable for Hgb 8.7, Ca 7.6, troponin I 42.  CXR with cardiomegaly with pulmonary vascular congestion and increased airspace disease in the mid to lower lung fields and small right pleural effusion.  He was admitted for acute hypoxic respiratory failure and we were consulted to provide dialysis during his hospitalization.  Of note, he has been signing off an hour early from HD for the last 2 weeks and had a similar presentation on 01/27/23.  Past Medical History:  Diagnosis Date   Cardiomyopathy (HCC)    CKD    Cocaine use    ESRD (end stage renal disease) on dialysis (HCC) 12/05/2022   Hypertension    Tobacco abuse    Past Surgical History:  Procedure Laterality Date   AV FISTULA PLACEMENT Left 12/27/2022   Procedure: INSERTION OF LEFT ARM  ARTERIOVENOUS FISTULA;  Surgeon: Larina Earthly, MD;  Location: [facility];  Service: Vascular;  Laterality: Left;   BACK SURGERY     IR FLUORO GUIDE CV LINE RIGHT  12/07/2022   IR US GUIDE VASC ACCESS RIGHT  12/07/2022   Family History:   Family History  Problem Relation Age of Onset   Heart disease Neg Hx    Social History:  reports that he has been smoking cigarettes. He has a 30 pack-year smoking history. He quit smokeless tobacco use about 4 months ago. He reports that he does not currently use alcohol. He reports current drug use. Drug: Codeine. No Known Allergies Prior to Admission medications   Medication Sig Start Date End Date Taking? Authorizing Provider  amLODipine (NORVASC) 10 MG tablet Take 1 tablet (10  mg total) by mouth daily. 11/09/22  Yes Johnson, Clanford L, MD  calcium acetate (PHOSLO) 667 MG capsule Take 667 mg by mouth 3 (three) times daily with meals.   Yes [provider]  carvedilol (COREG) 6.25 MG tablet Take 1 tablet (6.25 mg total) by mouth 2 (two) times daily with a meal. 01/20/23  Yes Leone Brand, NP  furosemide (LASIX) 80 MG tablet Take 80 mg by mouth See admin instructions. Take 80 mg 4 times a week on non-dialysis days 01/30/23  Yes [provider]  hydrALAZINE (APRESOLINE) 25 MG tablet Take 1 tablet (25 mg total) by mouth 3 (three) times daily. 11/08/22  Yes Johnson, Clanford L, MD  isosorbide mononitrate (IMDUR) 30 MG 24 hr tablet Take 0.5 tablets (15 mg total) by mouth daily. 11/09/22  Yes Johnson, Clanford L, MD  traZODone (DESYREL) 100 MG tablet Take 100 mg by mouth at bedtime.   Yes [provider]   Current Facility-Administered Medications  Medication Dose Route Frequency Provider Last Rate Last Admin   acetaminophen (TYLENOL) tablet 650 mg  650 mg Oral Q6H PRN Adefeso, Oladapo, DO       Or   acetaminophen (TYLENOL) suppository 650 mg  650 mg Rectal Q6H PRN Adefeso, Oladapo, DO       alteplase (CATHFLO ACTIVASE) injection 2 mg  2 mg Intracatheter Once PRN Terrial Rhodes, MD  amLODipine (NORVASC) tablet 10 mg  10 mg Oral Daily Adefeso, Oladapo, DO   10 mg at 02/06/23 0424   anticoagulant sodium citrate solution 5 mL  5 mL Intracatheter PRN Terrial Rhodes, MD       calcium acetate (PHOSLO) capsule 667 mg  667 mg Oral TID WC Adefeso, Oladapo, DO   667 mg at 02/06/23 1658   carvedilol (COREG) tablet 6.25 mg  6.25 mg Oral BID WC Adefeso, Oladapo, DO   6.25 mg at 02/06/23 1742   Chlorhexidine Gluconate Cloth 2 % PADS 6 each  6 each Topical Q0600 Terrial Rhodes, MD       heparin injection 1,000 Units  1,000 Units Intracatheter PRN Terrial Rhodes, MD       heparin injection 5,000 Units  5,000 Units Subcutaneous Q8H Adefeso, Oladapo,  DO   5,000 Units at 02/06/23 2155   hydrALAZINE (APRESOLINE) tablet 25 mg  25 mg Oral TID Frankey Shown, DO   25 mg at 02/06/23 2154   isosorbide mononitrate (IMDUR) 24 hr tablet 15 mg  15 mg Oral Daily Adefeso, Oladapo, DO   15 mg at 02/06/23 0906   labetalol (NORMODYNE) injection 5 mg  5 mg Intravenous Q6H PRN Adefeso, Oladapo, DO   5 mg at 02/07/23 0520   lidocaine (PF) (XYLOCAINE) 1 % injection 5 mL  5 mL Intradermal PRN Terrial Rhodes, MD       lidocaine-prilocaine (EMLA) cream 1 Application  1 Application Topical PRN Terrial Rhodes, MD       ondansetron [facility]) tablet 4 mg  4 mg Oral Q6H PRN Adefeso, Oladapo, DO       Or   ondansetron (ZOFRAN) injection 4 mg  4 mg Intravenous Q6H PRN Adefeso, Oladapo, DO       pentafluoroprop-tetrafluoroeth (GEBAUERS) aerosol 1 Application  1 Application Topical PRN Terrial Rhodes, MD       polyethylene glycol (MIRALAX / GLYCOLAX) packet 17 g  17 g Oral Daily Mariea Clonts, Courage, MD   17 g at 02/06/23 2044   senna-docusate (Senokot-S) tablet 2 tablet  2 tablet Oral QHS Shon Hale, MD   2 tablet at 02/06/23 2154   Labs: Basic Metabolic Panel: Recent Labs  Lab 02/05/23 1352 02/06/23 0017 02/06/23 0356 02/07/23 0446  NA 135 136  --  137  K 3.8 3.7  --  4.1  CL 102 101  --  101  CO2 28 24  --  23  GLUCOSE 82 110*  --  94  BUN 50* 57*  --  75*  CREATININE 9.17* 10.26*  --  12.47*  CALCIUM 7.7* 7.6*  --  8.2*  PHOS  --   --  6.8* 7.3*   Liver Function Tests: Recent Labs  Lab 02/07/23 0446  AST 11*  ALT 10  ALKPHOS 44  BILITOT 0.4  PROT 6.3*  ALBUMIN 2.7*   No results for input(s): "LIPASE", "AMYLASE" in the last 168 hours. No results for input(s): "AMMONIA" in the last 168 hours. CBC: Recent Labs  Lab 02/05/23 1352 02/06/23 0017 02/07/23 0446  WBC 4.9 5.1 4.8  NEUTROABS  --  3.3  --   HGB 8.4* 8.7* 8.4*  HCT 25.8* 26.5* 26.0*  MCV 92.8 93.3 92.5  PLT 288 296 297   Cardiac Enzymes: No results for input(s):  "CKTOTAL", "CKMB", "CKMBINDEX", "TROPONINI" in the last 168 hours. CBG: No results for input(s): "GLUCAP" in the last 168 hours. Iron Studies: No results for input(s): "IRON", "TIBC", "TRANSFERRIN", "FERRITIN" in the last  72 hours. Studies/Results: DG Chest Portable 1 View  Result Date: 02/06/2023 CLINICAL DATA:  Shortness of breath. EXAM: PORTABLE CHEST 1 VIEW COMPARISON:  01/28/2023. FINDINGS: The heart is enlarged and the mediastinal contour stable. The pulmonary vasculature is distended. Atherosclerotic calcification of the aorta is noted. Patchy airspace disease is noted in the mid to lower lung fields bilaterally, increased from the prior exam. There is a small right pleural effusion. No pneumothorax. A right central venous catheter is unchanged. No acute osseous abnormality. IMPRESSION: 1. Cardiomegaly with pulmonary vascular congestion. 2. Increased patchy airspace disease in the mid to lower lung fields bilaterally, suggesting edema. 3. Small right pleural effusion. Electronically Signed   By: Thornell Sartorius M.D.   On: 02/06/2023 00:22   DG Chest 2 View  Result Date: 02/05/2023 CLINICAL DATA:  Shortness of breath EXAM: CHEST - 2 VIEW COMPARISON:  01/27/2023 FINDINGS: Stable right IJ large-bore catheter with tip at the SVC right atrial junction. Slightly enlarged heart. Small effusions. Mild interstitial edema. No consolidation or pneumothorax. IMPRESSION: Enlarged heart with tiny effusions and mild edema. Right IJ large-bore double-lumen catheter Electronically Signed   By: Karen Kays M.D.   On: 02/05/2023 14:11    ROS: Pertinent items are noted in HPI. Physical Exam: Vitals:   02/07/23 0915 02/07/23 0918 02/07/23 0920 02/07/23 0930  BP: (!) 173/100  (!) 170/100 (!) 174/101  Pulse: 100  99 (!) 103  Resp: (!) 30  (!) 30 (!) 29  Temp: 97.6 F (36.4 C)     TempSrc: Oral     SpO2: 95%  96% 95%  Weight:  71 kg 70.2 kg       Weight change: -2.329 kg  Intake/Output Summary (Last 24  hours) at 02/07/2023 0951 Last data filed at 02/06/2023 1200 Gross per 24 hour  Intake 240 ml  Output --  Net 240 ml   BP (!) 174/101   Pulse (!) 103   Temp 97.6 F (36.4 C) (Oral)   Resp (!) 29   Wt 70.2 kg   SpO2 95%   BMI 23.88 kg/m  General appearance: mild distress and sitting upright in bed Head: Normocephalic, without obvious abnormality, atraumatic Resp: wheezes bilaterally Cardio: tachycardic at 103 GI: soft, non-tender; bowel sounds normal; no masses,  no organomegaly Extremities: edema 3+ edema of bilateral lower extremities and LUE AVF +T/B Dialysis Access:  Dialysis Orders: Center: [facility]  on TTS . EDW 68 kg HD Bath 2K/2.5Ca  Time 4:00 Heparin 1000 unit bolus, then 500 units/hr. Access RIJ TDC BFR 350 DFR 500      Assessment/Plan:  Acute hypoxic respiratory failure - due to volume overload and noncompliance with dialysis.  Will plan for HD today and again tomorrow to help improve his oxygen requirements.  I did stress the importance of compliance with HD so that he does not keep being admitted to the hospital.  ESRD -   as above, HD today and again tomorrow  Hypertension/volume  - markedly volume overloaded and will challenge edw   Anemia  - stable will dose with ESA.  Metabolic bone disease -   continue with home meds  Nutrition -  renal diet.  Acute on chronic combined systolic and diastolic CHF - will UF as tolerated as above.  Irena Cords, MD [facility], [facility] 02/07/2023, 9:51 AM

## 2023-02-09 DIAGNOSIS — N186 End stage renal disease: Secondary | ICD-10-CM | POA: Diagnosis not present

## 2023-02-09 DIAGNOSIS — Z992 Dependence on renal dialysis: Secondary | ICD-10-CM | POA: Diagnosis not present

## 2023-02-11 ENCOUNTER — Emergency Department (HOSPITAL_COMMUNITY)
Admission: EM | Admit: 2023-02-11 | Discharge: 2023-02-11 | Disposition: A | Discharge status: Home / Self Care | Payer: 59 | Attending: Emergency Medicine | Admitting: Emergency Medicine

## 2023-02-11 ENCOUNTER — Emergency Department (HOSPITAL_COMMUNITY): Payer: 59

## 2023-02-11 ENCOUNTER — Other Ambulatory Visit: Payer: Self-pay

## 2023-02-11 ENCOUNTER — Emergency Department (HOSPITAL_COMMUNITY)
Admission: EM | Admit: 2023-02-11 | Discharge: 2023-02-11 | Discharge status: Against Medical Advice | Payer: 59 | Source: Home / Self Care

## 2023-02-11 DIAGNOSIS — J90 Pleural effusion, not elsewhere classified: Secondary | ICD-10-CM | POA: Diagnosis not present

## 2023-02-11 DIAGNOSIS — R7989 Other specified abnormal findings of blood chemistry: Secondary | ICD-10-CM | POA: Diagnosis not present

## 2023-02-11 DIAGNOSIS — R0602 Shortness of breath: Secondary | ICD-10-CM | POA: Diagnosis not present

## 2023-02-11 DIAGNOSIS — Z992 Dependence on renal dialysis: Secondary | ICD-10-CM | POA: Insufficient documentation

## 2023-02-11 DIAGNOSIS — I10 Essential (primary) hypertension: Secondary | ICD-10-CM | POA: Diagnosis not present

## 2023-02-11 DIAGNOSIS — R0600 Dyspnea, unspecified: Secondary | ICD-10-CM | POA: Diagnosis not present

## 2023-02-11 DIAGNOSIS — N186 End stage renal disease: Secondary | ICD-10-CM | POA: Insufficient documentation

## 2023-02-11 DIAGNOSIS — D72819 Decreased white blood cell count, unspecified: Secondary | ICD-10-CM | POA: Insufficient documentation

## 2023-02-11 DIAGNOSIS — Z79899 Other long term (current) drug therapy: Secondary | ICD-10-CM | POA: Diagnosis not present

## 2023-02-11 DIAGNOSIS — R918 Other nonspecific abnormal finding of lung field: Secondary | ICD-10-CM | POA: Diagnosis not present

## 2023-02-11 DIAGNOSIS — Z743 Need for continuous supervision: Secondary | ICD-10-CM | POA: Diagnosis not present

## 2023-02-11 DIAGNOSIS — J811 Chronic pulmonary edema: Secondary | ICD-10-CM | POA: Diagnosis not present

## 2023-02-11 DIAGNOSIS — I509 Heart failure, unspecified: Secondary | ICD-10-CM | POA: Insufficient documentation

## 2023-02-11 DIAGNOSIS — I132 Hypertensive heart and chronic kidney disease with heart failure and with stage 5 chronic kidney disease, or end stage renal disease: Secondary | ICD-10-CM | POA: Insufficient documentation

## 2023-02-11 LAB — COMPREHENSIVE METABOLIC PANEL
ALT: 11 U/L (ref 0–44)
AST: 15 U/L (ref 15–41)
Albumin: 2.7 g/dL — ABNORMAL LOW (ref 3.5–5.0)
Alkaline Phosphatase: 44 U/L (ref 38–126)
Anion gap: 9 (ref 5–15)
BUN: 14 mg/dL (ref 6–20)
CO2: 28 mmol/L (ref 22–32)
Calcium: 7.7 mg/dL — ABNORMAL LOW (ref 8.9–10.3)
Chloride: 99 mmol/L (ref 98–111)
Creatinine, Ser: 3.68 mg/dL — ABNORMAL HIGH (ref 0.61–1.24)
GFR, Estimated: 18 mL/min — ABNORMAL LOW (ref >60)
Glucose, Bld: 80 mg/dL (ref 70–99)
Potassium: 3 mmol/L — ABNORMAL LOW (ref 3.5–5.1)
Sodium: 136 mmol/L (ref 135–145)
Total Bilirubin: 0.3 mg/dL (ref 0.3–1.2)
Total Protein: 6.3 g/dL — ABNORMAL LOW (ref 6.5–8.1)

## 2023-02-11 LAB — CBC WITH DIFFERENTIAL/PLATELET
Abs Immature Granulocytes: 0 10*3/uL (ref 0.00–0.07)
Basophils Absolute: 0 10*3/uL (ref 0.0–0.1)
Basophils Relative: 1 %
Eosinophils Absolute: 0.2 10*3/uL (ref 0.0–0.5)
Eosinophils Relative: 6 %
HCT: 26 % — ABNORMAL LOW (ref 39.0–52.0)
Hemoglobin: 8.8 g/dL — ABNORMAL LOW (ref 13.0–17.0)
Immature Granulocytes: 0 %
Lymphocytes Relative: 31 %
Lymphs Abs: 0.9 10*3/uL (ref 0.7–4.0)
MCH: 30.4 pg (ref 26.0–34.0)
MCHC: 33.8 g/dL (ref 30.0–36.0)
MCV: 90 fL (ref 80.0–100.0)
Monocytes Absolute: 0.4 10*3/uL (ref 0.1–1.0)
Monocytes Relative: 13 %
Neutro Abs: 1.5 10*3/uL — ABNORMAL LOW (ref 1.7–7.7)
Neutrophils Relative %: 49 %
Platelets: 268 10*3/uL (ref 150–400)
RBC: 2.89 MIL/uL — ABNORMAL LOW (ref 4.22–5.81)
RDW: 13.9 % (ref 11.5–15.5)
WBC: 2.9 10*3/uL — ABNORMAL LOW (ref 4.0–10.5)
nRBC: 0 % (ref 0.0–0.2)

## 2023-02-11 LAB — BRAIN NATRIURETIC PEPTIDE: B Natriuretic Peptide: 2287 pg/mL — ABNORMAL HIGH (ref 0.0–100.0)

## 2023-02-11 MED ORDER — HYDRALAZINE HCL 25 MG PO TABS
25.0000 mg | ORAL_TABLET | Freq: Once | ORAL | Status: AC
  Administered 2023-02-11: 25 mg via ORAL
  Filled 2023-02-11: qty 1

## 2023-02-11 MED ORDER — CARVEDILOL 12.5 MG PO TABS
12.5000 mg | ORAL_TABLET | Freq: Two times a day (BID) | ORAL | Status: DC
  Administered 2023-02-11: 12.5 mg via ORAL
  Filled 2023-02-11: qty 1

## 2023-02-11 NOTE — Discharge Instructions (Signed)
Elevate your legs when possible.  Continue to wear your compression stockings.  As discussed.  Important that you continue your full dialysis treatments as directed and take your furosemide on alternating days.  Please follow-up with your primary care provider for recheck.  Return emergency department for any new or worsening symptoms.

## 2023-02-11 NOTE — ED Notes (Signed)
Dc instructions reviewed with pt no questions or concerns

## 2023-02-11 NOTE — ED Triage Notes (Signed)
Pt bib EMS from Dialysis. After completing dialysis pt o2 sats were in the mid to low 80s and was advised to come to hospital. Pt states he does not feel SOB now but did yesterday. Per EMS pt sats were in the low 90s on room air.

## 2023-02-11 NOTE — ED Provider Notes (Signed)
[place] EMERGENCY DEPARTMENT AT [facility] Provider Note   CSN: [id number] Arrival date & time: 02/11/23  1028     History  Chief Complaint  Patient presents with   Shortness of Breath    Damon Williams is a 62 y.o. male.   Shortness of Breath Associated symptoms: no abdominal pain, no chest pain, no cough, no fever, no rash and no vomiting        Damon Williams is a 62 y.o. male with past medical history of end-stage renal disease, currently on hemodialysis, anemia of chronic kidney disease, hypertension, and CHF who presents to the Emergency Department for evaluation of shortness of breath.  States he was at dialysis and completed his treatment, states his O2 sats at the facility were in the 80s and he was advised to come to the hospital.  He was recently discharged home from the hospital with similar symptoms.  Does not feel short of breath at this time.  Admits that he was not taking his full dialysis treatment previously but since his recent hospitalization, understands that he needs to complete the full course of his treatments.  Denies any chest pain, fever, or cough  Home Medications Prior to Admission medications   Medication Sig Start Date End Date Taking? Authorizing Provider  amLODipine (NORVASC) 10 MG tablet Take 1 tablet (10 mg total) by mouth daily. 02/07/23   Shon Hale, MD  calcium acetate (PHOSLO) 667 MG capsule Take 667 mg by mouth 3 (three) times daily with meals.    [provider]  carvedilol (COREG) 12.5 MG tablet Take 1 tablet (12.5 mg total) by mouth 2 (two) times daily with a meal. 02/07/23   Emokpae, Courage, MD  furosemide (LASIX) 80 MG tablet Take 80 mg by mouth See admin instructions. Take 80 mg 4 times a week on non-dialysis days 01/30/23   [provider]  hydrALAZINE (APRESOLINE) 25 MG tablet Take 1 tablet (25 mg total) by mouth 3 (three) times daily. 02/07/23   Shon Hale, MD  isosorbide mononitrate (IMDUR)  30 MG 24 hr tablet Take 0.5 tablets (15 mg total) by mouth daily. 02/07/23   Shon Hale, MD  traZODone (DESYREL) 100 MG tablet Take 100 mg by mouth at bedtime.    [provider]      Allergies    Patient has no known allergies.    Review of Systems   Review of Systems  Constitutional:  Negative for chills and fever.  Respiratory:  Positive for shortness of breath. Negative for cough.   Cardiovascular:  Negative for chest pain.  Gastrointestinal:  Negative for abdominal pain, nausea and vomiting.  Musculoskeletal:  Negative for arthralgias and back pain.  Skin:  Negative for rash.  Neurological:  Negative for dizziness, weakness and numbness.    Physical Exam Updated Vital Signs There were no vitals taken for this visit. Physical Exam Vitals and nursing note reviewed.  Constitutional:      General: He is not in acute distress.    Appearance: He is well-developed. He is not toxic-appearing.  HENT:     Mouth/Throat:     Mouth: Mucous membranes are moist.  Cardiovascular:     Rate and Rhythm: Normal rate and regular rhythm.     Pulses: Normal pulses.  Pulmonary:     Effort: Pulmonary effort is normal.     Breath sounds: No wheezing, rhonchi or rales.     Comments: Patient able to speak in complete sentences without  increased work of breathing. Abdominal:     Palpations: Abdomen is soft.     Tenderness: There is no abdominal tenderness.  Musculoskeletal:        General: Normal range of motion.     Right lower leg: No edema.     Left lower leg: No edema.  Skin:    General: Skin is warm.     Capillary Refill: Capillary refill takes less than 2 seconds.  Neurological:     General: No focal deficit present.     Mental Status: He is alert.     Sensory: No sensory deficit.     Motor: Weakness present.     ED Results / Procedures / Treatments   Labs (all labs ordered are listed, but only abnormal results are displayed) Labs Reviewed - No data to  display  EKG EKG Interpretation Date/Time:  Saturday February 11 2023 11:33:02 EDT Ventricular Rate:  93 PR Interval:  177 QRS Duration:  88 QT Interval:  380 QTC Calculation: 473 R Axis:   55  Text Interpretation: Sinus or ectopic atrial rhythm Probable left atrial enlargement LVH with secondary repolarization abnormality Anterior infarct, old Confirmed by Vanetta Mulders [phone]) on 02/11/2023 11:39:58 AM  Radiology DG Chest 2 View  Result Date: 02/11/2023 CLINICAL DATA:  Shortness of breath. EXAM: CHEST - 2 VIEW COMPARISON:  Chest radiograph 02/06/2023, 02/05/2023, 01/27/2023, 12/04/2022 FINDINGS: Right internal jugular dual-lumen central venous catheter tip again overlies the superior vena cava/right atrial junction. The cardiac silhouette is again moderately enlarged. Mediastinal contours are within normal limits. Moderate bilateral interstitial thickening. Mild bilateral patchy airspace opacities are slightly improved from 02/06/2023 most recent chest radiograph. Small bilateral pleural effusions are similar to prior. No pneumothorax. No acute skeletal abnormality. IMPRESSION: 1. Moderate bilateral interstitial thickening and mild bilateral patchy airspace opacities are slightly improved from 02/06/2023 most recent chest radiograph. This represents a combination of moderate interstitial pulmonary edema and mild alveolar pulmonary edema, mildly improved. 2. Small bilateral pleural effusions are similar to prior. Electronically Signed   By: Neita Garnet M.D.   On: 02/11/2023 12:08    Procedures Procedures    Medications Ordered in ED Medications - No data to display  ED Course/ Medical Decision Making/ A&P                                 Medical Decision Making Patient here from dialysis center for evaluation of low oxygen saturation at the facility.  Patient denies any shortness of breath on arrival here.  Discharged from the hospital 02/07/2023 after admission for hypervolemia.   Reported to have sats in the 80s at the facility.  Does not have supplemental oxygen requirement at baseline  Here patient's O2 sats in the lower to mid 90s.  I do not appreciate any increased work of breathing on his exam.  He denies any chest pain and completed his dialysis treatment prior to arrival  Amount and/or Complexity of Data Reviewed Labs: ordered.    Details: Labs interpreted by me leukopenic with a white count of 2.9.  Patient just completed dialysis treatment.  Serum creatinine currently 3.68.  BMP greater than 2200, slightly elevated from 2 weeks ago. Radiology: ordered.    Details: Chest x-ray shows moderate bilateral interstitial thickening and bilateral opacities improved from 02/06/2023 Discussion of management or test interpretation with external provider(s): Patient has been observed in the department, he had a few very brief  episodes that sats would drop into the upper 80s but would quickly improve into the low 90s.  He ambulated in the department without shortness of breath.  No persistent hypoxia.  Patient states that he feels fine and would like to go home.  He does not want to be admitted again.  I feel this is reasonable.  Had discussion about importance of routine dialysis and he verbalized understanding.  Strict return precautions were also given.  Risk Prescription drug management. Risk Details: Pt ambulated in the dept, O2 dropped temporarily into upper 80's but quickly improved to low 90's.             Final Clinical Impression(s) / ED Diagnoses Final diagnoses:  Dyspnea, unspecified type    Rx / DC Orders ED Discharge Orders     None         Pauline Aus, PA-C 02/14/23 1249    Vanetta Mulders, MD 02/15/23 [phone]

## 2023-02-12 ENCOUNTER — Emergency Department (HOSPITAL_COMMUNITY)
Admission: EM | Admit: 2023-02-12 | Discharge: 2023-02-13 | Disposition: A | Discharge status: Home / Self Care | Payer: 59 | Attending: Emergency Medicine | Admitting: Emergency Medicine

## 2023-02-12 ENCOUNTER — Other Ambulatory Visit: Payer: Self-pay

## 2023-02-12 ENCOUNTER — Encounter (HOSPITAL_COMMUNITY): Payer: Self-pay | Admitting: Emergency Medicine

## 2023-02-12 DIAGNOSIS — N186 End stage renal disease: Secondary | ICD-10-CM | POA: Diagnosis not present

## 2023-02-12 DIAGNOSIS — R0602 Shortness of breath: Secondary | ICD-10-CM | POA: Diagnosis not present

## 2023-02-12 DIAGNOSIS — E8770 Fluid overload, unspecified: Secondary | ICD-10-CM

## 2023-02-12 DIAGNOSIS — R0600 Dyspnea, unspecified: Secondary | ICD-10-CM

## 2023-02-12 DIAGNOSIS — Z992 Dependence on renal dialysis: Secondary | ICD-10-CM | POA: Insufficient documentation

## 2023-02-12 DIAGNOSIS — R600 Localized edema: Secondary | ICD-10-CM | POA: Diagnosis not present

## 2023-02-12 DIAGNOSIS — R918 Other nonspecific abnormal finding of lung field: Secondary | ICD-10-CM | POA: Diagnosis not present

## 2023-02-12 NOTE — ED Triage Notes (Signed)
Pt here stating he "just needs some oxygen". Pt states he has been here frequently for same and that he "is usually fine after they put him on oxygen for awhile. Pt had dyspnea with walking to room from lobby. O2 sats were initially 89% but then increased to 93% on room air. Pt wanted to be placed on Oxygen for comfort so placed pt on O2 @ 2l nasal cannula. Pt does not have home O2 and states that is what he needs. Pt has no other complaints.

## 2023-02-13 ENCOUNTER — Emergency Department (HOSPITAL_COMMUNITY): Payer: 59

## 2023-02-13 DIAGNOSIS — R918 Other nonspecific abnormal finding of lung field: Secondary | ICD-10-CM | POA: Diagnosis not present

## 2023-02-13 DIAGNOSIS — Z992 Dependence on renal dialysis: Secondary | ICD-10-CM | POA: Diagnosis not present

## 2023-02-13 DIAGNOSIS — N186 End stage renal disease: Secondary | ICD-10-CM | POA: Diagnosis not present

## 2023-02-13 DIAGNOSIS — R0602 Shortness of breath: Secondary | ICD-10-CM | POA: Diagnosis not present

## 2023-02-13 DIAGNOSIS — E8779 Other fluid overload: Secondary | ICD-10-CM | POA: Diagnosis not present

## 2023-02-13 LAB — CBC WITH DIFFERENTIAL/PLATELET
Abs Immature Granulocytes: 0 10*3/uL (ref 0.00–0.07)
Basophils Absolute: 0.1 10*3/uL (ref 0.0–0.1)
Basophils Relative: 1 %
Eosinophils Absolute: 0.3 10*3/uL (ref 0.0–0.5)
Eosinophils Relative: 5 %
HCT: 25.4 % — ABNORMAL LOW (ref 39.0–52.0)
Hemoglobin: 8.6 g/dL — ABNORMAL LOW (ref 13.0–17.0)
Immature Granulocytes: 0 %
Lymphocytes Relative: 26 %
Lymphs Abs: 1.3 10*3/uL (ref 0.7–4.0)
MCH: 30.5 pg (ref 26.0–34.0)
MCHC: 33.9 g/dL (ref 30.0–36.0)
MCV: 90.1 fL (ref 80.0–100.0)
Monocytes Absolute: 0.5 10*3/uL (ref 0.1–1.0)
Monocytes Relative: 9 %
Neutro Abs: 3 10*3/uL (ref 1.7–7.7)
Neutrophils Relative %: 59 %
Platelets: 314 10*3/uL (ref 150–400)
RBC: 2.82 MIL/uL — ABNORMAL LOW (ref 4.22–5.81)
RDW: 14 % (ref 11.5–15.5)
WBC: 5.1 10*3/uL (ref 4.0–10.5)
nRBC: 0 % (ref 0.0–0.2)

## 2023-02-13 LAB — BASIC METABOLIC PANEL
Anion gap: 11 (ref 5–15)
BUN: 50 mg/dL — ABNORMAL HIGH (ref 6–20)
CO2: 25 mmol/L (ref 22–32)
Calcium: 7.8 mg/dL — ABNORMAL LOW (ref 8.9–10.3)
Chloride: 101 mmol/L (ref 98–111)
Creatinine, Ser: 8.68 mg/dL — ABNORMAL HIGH (ref 0.61–1.24)
GFR, Estimated: 6 mL/min — ABNORMAL LOW (ref >60)
Glucose, Bld: 95 mg/dL (ref 70–99)
Potassium: 3.4 mmol/L — ABNORMAL LOW (ref 3.5–5.1)
Sodium: 137 mmol/L (ref 135–145)

## 2023-02-13 NOTE — ED Notes (Signed)
Tried pt on Room Air, O2 sats dropped to 88%. Pt placed back on O2 @ 2L and sats above 90%.

## 2023-02-13 NOTE — ED Provider Notes (Signed)
[place] EMERGENCY DEPARTMENT AT [facility] Provider Note   CSN: [id number] Arrival date & time: 02/12/23  2320     History  Chief Complaint  Patient presents with   Shortness of Breath    Damon Williams is a 62 y.o. male.  Patient is a 62 year old male with past medical history of end-stage renal disease on hemodialysis, cardiomyopathy, anemia.  Patient presenting today with complaints of shortness of breath.  This has been an ongoing issue for him for several weeks.  He was here 2 days ago with similar complaints, but allowed to go home.  Patient states that when he lies flat or exerts himself, he becomes short of breath.  He denies any fevers, chills, chest pain, or productive cough.  His last dialysis was Saturday and is not due again until Tuesday.  At the time of my dictation it is nearly 3 AM on Monday.  The history is provided by the patient.       Home Medications Prior to Admission medications   Medication Sig Start Date End Date Taking? Authorizing Provider  amLODipine (NORVASC) 10 MG tablet Take 1 tablet (10 mg total) by mouth daily. 02/07/23   Shon Hale, MD  calcium acetate (PHOSLO) 667 MG capsule Take 667 mg by mouth 3 (three) times daily with meals.    [provider]  carvedilol (COREG) 12.5 MG tablet Take 1 tablet (12.5 mg total) by mouth 2 (two) times daily with a meal. 02/07/23   Emokpae, Courage, MD  furosemide (LASIX) 80 MG tablet Take 80 mg by mouth See admin instructions. Take 80 mg 4 times a week on non-dialysis days 01/30/23   [provider]  hydrALAZINE (APRESOLINE) 25 MG tablet Take 1 tablet (25 mg total) by mouth 3 (three) times daily. 02/07/23   Shon Hale, MD  isosorbide mononitrate (IMDUR) 30 MG 24 hr tablet Take 0.5 tablets (15 mg total) by mouth daily. 02/07/23   Shon Hale, MD  traZODone (DESYREL) 100 MG tablet Take 100 mg by mouth at bedtime.    [provider]      Allergies    Patient has  no known allergies.    Review of Systems   Review of Systems  All other systems reviewed and are negative.   Physical Exam Updated Vital Signs BP (!) 167/102   Pulse 92   Temp 97.7 F (36.5 C) (Oral)   Resp 20   Ht 5\' 7"  (1.702 m)   Wt 67 kg   SpO2 95%   BMI 23.13 kg/m  Physical Exam Vitals and nursing note reviewed.  Constitutional:      General: He is not in acute distress.    Appearance: He is well-developed. He is not diaphoretic.  HENT:     Head: Normocephalic and atraumatic.  Cardiovascular:     Rate and Rhythm: Normal rate and regular rhythm.     Heart sounds: No murmur heard.    No friction rub.  Pulmonary:     Effort: Pulmonary effort is normal. No respiratory distress.     Breath sounds: Examination of the right-lower field reveals rales. Examination of the left-lower field reveals rales. Rales present. No wheezing.  Abdominal:     General: Bowel sounds are normal. There is no distension.     Palpations: Abdomen is soft.     Tenderness: There is no abdominal tenderness.  Musculoskeletal:        General: Normal range of motion.  Cervical back: Normal range of motion and neck supple.     Right lower leg: Edema present.     Left lower leg: Edema present.     Comments: There is 1-2+ pitting edema both lower extremities.  Skin:    General: Skin is warm and dry.  Neurological:     Mental Status: He is alert and oriented to person, place, and time.     Coordination: Coordination normal.     ED Results / Procedures / Treatments   Labs (all labs ordered are listed, but only abnormal results are displayed) Labs Reviewed - No data to display  EKG None  Radiology DG Chest 2 View  Result Date: 02/11/2023 CLINICAL DATA:  Shortness of breath. EXAM: CHEST - 2 VIEW COMPARISON:  Chest radiograph 02/06/2023, 02/05/2023, 01/27/2023, 12/04/2022 FINDINGS: Right internal jugular dual-lumen central venous catheter tip again overlies the superior vena cava/right  atrial junction. The cardiac silhouette is again moderately enlarged. Mediastinal contours are within normal limits. Moderate bilateral interstitial thickening. Mild bilateral patchy airspace opacities are slightly improved from 02/06/2023 most recent chest radiograph. Small bilateral pleural effusions are similar to prior. No pneumothorax. No acute skeletal abnormality. IMPRESSION: 1. Moderate bilateral interstitial thickening and mild bilateral patchy airspace opacities are slightly improved from 02/06/2023 most recent chest radiograph. This represents a combination of moderate interstitial pulmonary edema and mild alveolar pulmonary edema, mildly improved. 2. Small bilateral pleural effusions are similar to prior. Electronically Signed   By: Neita Garnet M.D.   On: 02/11/2023 12:08    Procedures Procedures    Medications Ordered in ED Medications - No data to display  ED Course/ Medical Decision Making/ A&P  Patient is a 62 year old male with history of end-stage renal disease on hemodialysis presenting with complaints of shortness of breath.  Patient presenting here frequently recently with similar complaints requesting oxygen.  Patient arrives here with stable vital signs, but oxygen saturations are in the low 90s on room air.  Workup initiated including CBC and basic metabolic panel.  Laboratory studies are consistent with baseline.  There is no elevation of potassium.  Chest x-ray shows volume overload versus infiltrate.  I have personally ambulated the patient in the emergency department and he appears clinically well.  He does get somewhat winded with oxygen saturations dropping to 90% with exertion.  Patient is adamant about not being admitted.  He is requesting to be discharged so that he can go to his dialysis center and have them dialyze him.  Patient will be discharged at his request.  Final Clinical Impression(s) / ED Diagnoses Final diagnoses:  None    Rx / DC Orders ED  Discharge Orders     None         Geoffery Lyons, MD 02/13/23 [phone]

## 2023-02-13 NOTE — Discharge Instructions (Signed)
Return to the emergency department if your breathing worsens, or if you develop other new and concerning symptoms.

## 2023-02-14 DIAGNOSIS — N186 End stage renal disease: Secondary | ICD-10-CM | POA: Diagnosis not present

## 2023-02-14 DIAGNOSIS — Z992 Dependence on renal dialysis: Secondary | ICD-10-CM | POA: Diagnosis not present

## 2023-02-17 DIAGNOSIS — Z992 Dependence on renal dialysis: Secondary | ICD-10-CM | POA: Diagnosis not present

## 2023-02-17 DIAGNOSIS — N186 End stage renal disease: Secondary | ICD-10-CM | POA: Diagnosis not present

## 2023-02-18 DIAGNOSIS — Z992 Dependence on renal dialysis: Secondary | ICD-10-CM | POA: Diagnosis not present

## 2023-02-18 DIAGNOSIS — N186 End stage renal disease: Secondary | ICD-10-CM | POA: Diagnosis not present

## 2023-02-21 DIAGNOSIS — N186 End stage renal disease: Secondary | ICD-10-CM | POA: Diagnosis not present

## 2023-02-21 DIAGNOSIS — Z992 Dependence on renal dialysis: Secondary | ICD-10-CM | POA: Diagnosis not present

## 2023-02-22 ENCOUNTER — Encounter: Payer: Self-pay | Admitting: *Deleted

## 2023-02-23 DIAGNOSIS — N186 End stage renal disease: Secondary | ICD-10-CM | POA: Diagnosis not present

## 2023-02-23 DIAGNOSIS — Z992 Dependence on renal dialysis: Secondary | ICD-10-CM | POA: Diagnosis not present

## 2023-02-25 DIAGNOSIS — Z992 Dependence on renal dialysis: Secondary | ICD-10-CM | POA: Diagnosis not present

## 2023-02-25 DIAGNOSIS — N186 End stage renal disease: Secondary | ICD-10-CM | POA: Diagnosis not present

## 2023-02-27 DIAGNOSIS — N186 End stage renal disease: Secondary | ICD-10-CM | POA: Diagnosis not present

## 2023-02-27 DIAGNOSIS — Z992 Dependence on renal dialysis: Secondary | ICD-10-CM | POA: Diagnosis not present

## 2023-02-28 DIAGNOSIS — N186 End stage renal disease: Secondary | ICD-10-CM | POA: Diagnosis not present

## 2023-02-28 DIAGNOSIS — Z992 Dependence on renal dialysis: Secondary | ICD-10-CM | POA: Diagnosis not present

## 2023-03-01 ENCOUNTER — Ambulatory Visit (INDEPENDENT_AMBULATORY_CARE_PROVIDER_SITE_OTHER): Payer: 59 | Admitting: Physician Assistant

## 2023-03-01 ENCOUNTER — Encounter: Payer: Self-pay | Admitting: Physician Assistant

## 2023-03-01 VITALS — BP 136/75 | HR 84 | Temp 98.7°F | Wt 140.0 lb

## 2023-03-01 DIAGNOSIS — Z992 Dependence on renal dialysis: Secondary | ICD-10-CM

## 2023-03-01 DIAGNOSIS — N186 End stage renal disease: Secondary | ICD-10-CM

## 2023-03-01 NOTE — Progress Notes (Unsigned)
  POST OPERATIVE OFFICE NOTE    CC:  F/u for surgery  HPI:  Damon Williams is a 62 y.o. male who is s/p left brachiocephalic AV fistula creation on 12/27/2022 by Dr. Arbie Cookey.  This was done for permanent dialysis access.   Pt returns today for follow up.  Pt states he has felt fine since surgery.  He denies any symptoms of steal in the left hand such as excessive coldness, pain, numbness/weakness.  He believes his incision has healed well.  He currently dialyzes on Tuesdays, Thursdays, and Saturdays via [facility]. No Known Allergies  Current Outpatient Medications  Medication Sig Dispense Refill   amLODipine (NORVASC) 10 MG tablet Take 1 tablet (10 mg total) by mouth daily. 90 tablet 3   calcium acetate (PHOSLO) 667 MG capsule Take 667 mg by mouth 3 (three) times daily with meals.     carvedilol (COREG) 12.5 MG tablet Take 1 tablet (12.5 mg total) by mouth 2 (two) times daily with a meal. 60 tablet 5   furosemide (LASIX) 80 MG tablet Take 80 mg by mouth See admin instructions. Take 80 mg 4 times a week on non-dialysis days     hydrALAZINE (APRESOLINE) 25 MG tablet Take 1 tablet (25 mg total) by mouth 3 (three) times daily. 90 tablet 3   isosorbide mononitrate (IMDUR) 30 MG 24 hr tablet Take 0.5 tablets (15 mg total) by mouth daily. 15 tablet 5   traZODone (DESYREL) 100 MG tablet Take 100 mg by mouth at bedtime.     No current facility-administered medications for this visit.     ROS:  See HPI  Physical Exam:  Incision: Left upper arm incision well-healed without signs of infection or hematoma Extremities: Left brachiocephalic fistula with great thrill on palpation, easily identifiable on exam.  2+ left radial pulse Neuro: Intact motor and sensation of left upper extremity     Assessment/Plan:  This is a 62 y.o. male who is s/p: Creation of left brachiocephalic AV fistula on 12/27/2022  -The patient's left arm incision is well-healed without signs of infection or hematoma. -His left upper  arm fistula appears to be maturing well.  It has a great thrill on palpation and is easily identifiable at the level of the skin.  He did not get an ultrasound today -He denies any symptoms of steal in the left hand such as excessive coldness, pain, weakness/numbness.  He has a palpable left radial pulse -I have discussed with the patient given that his fistula is easily visualized, we can let dialysis try to use it in 1 month.  Another option would be to follow-up in a few weeks with an ultrasound.  He would prefer to follow-up with imaging prior to using the fistula -At this time dialysis should continue to use his catheter only.  He will follow-up with our office in 2 to 3 weeks with a dialysis duplex study.  If his fistula looks good at that time he can be cleared for use.   Loel Dubonnet, PA-C [facility] [phone]   Clinic MD:  Randie Heinz

## 2023-03-04 DIAGNOSIS — Z992 Dependence on renal dialysis: Secondary | ICD-10-CM | POA: Diagnosis not present

## 2023-03-04 DIAGNOSIS — N186 End stage renal disease: Secondary | ICD-10-CM | POA: Diagnosis not present

## 2023-03-06 DIAGNOSIS — N186 End stage renal disease: Secondary | ICD-10-CM | POA: Diagnosis not present

## 2023-03-06 DIAGNOSIS — Z992 Dependence on renal dialysis: Secondary | ICD-10-CM | POA: Diagnosis not present

## 2023-03-07 DIAGNOSIS — N186 End stage renal disease: Secondary | ICD-10-CM | POA: Diagnosis not present

## 2023-03-07 DIAGNOSIS — Z992 Dependence on renal dialysis: Secondary | ICD-10-CM | POA: Diagnosis not present

## 2023-03-08 ENCOUNTER — Other Ambulatory Visit: Payer: Self-pay

## 2023-03-08 DIAGNOSIS — N186 End stage renal disease: Secondary | ICD-10-CM

## 2023-03-09 DIAGNOSIS — Z992 Dependence on renal dialysis: Secondary | ICD-10-CM | POA: Diagnosis not present

## 2023-03-09 DIAGNOSIS — N186 End stage renal disease: Secondary | ICD-10-CM | POA: Diagnosis not present

## 2023-03-12 ENCOUNTER — Emergency Department (HOSPITAL_COMMUNITY)
Admission: EM | Admit: 2023-03-12 | Discharge: 2023-03-12 | Disposition: A | Discharge status: Home / Self Care | Payer: 59 | Attending: Emergency Medicine | Admitting: Emergency Medicine

## 2023-03-12 ENCOUNTER — Encounter (HOSPITAL_COMMUNITY): Payer: Self-pay | Admitting: *Deleted

## 2023-03-12 ENCOUNTER — Other Ambulatory Visit: Payer: Self-pay

## 2023-03-12 DIAGNOSIS — Z992 Dependence on renal dialysis: Secondary | ICD-10-CM | POA: Insufficient documentation

## 2023-03-12 DIAGNOSIS — I509 Heart failure, unspecified: Secondary | ICD-10-CM | POA: Diagnosis not present

## 2023-03-12 DIAGNOSIS — W57XXXA Bitten or stung by nonvenomous insect and other nonvenomous arthropods, initial encounter: Secondary | ICD-10-CM | POA: Insufficient documentation

## 2023-03-12 DIAGNOSIS — D631 Anemia in chronic kidney disease: Secondary | ICD-10-CM | POA: Insufficient documentation

## 2023-03-12 DIAGNOSIS — R7989 Other specified abnormal findings of blood chemistry: Secondary | ICD-10-CM | POA: Insufficient documentation

## 2023-03-12 DIAGNOSIS — S80862A Insect bite (nonvenomous), left lower leg, initial encounter: Secondary | ICD-10-CM | POA: Diagnosis not present

## 2023-03-12 DIAGNOSIS — I132 Hypertensive heart and chronic kidney disease with heart failure and with stage 5 chronic kidney disease, or end stage renal disease: Secondary | ICD-10-CM | POA: Diagnosis not present

## 2023-03-12 DIAGNOSIS — I429 Cardiomyopathy, unspecified: Secondary | ICD-10-CM | POA: Diagnosis not present

## 2023-03-12 DIAGNOSIS — R0789 Other chest pain: Secondary | ICD-10-CM | POA: Diagnosis not present

## 2023-03-12 DIAGNOSIS — R778 Other specified abnormalities of plasma proteins: Secondary | ICD-10-CM | POA: Diagnosis not present

## 2023-03-12 DIAGNOSIS — S80861A Insect bite (nonvenomous), right lower leg, initial encounter: Secondary | ICD-10-CM | POA: Insufficient documentation

## 2023-03-12 DIAGNOSIS — N186 End stage renal disease: Secondary | ICD-10-CM | POA: Insufficient documentation

## 2023-03-12 DIAGNOSIS — I120 Hypertensive chronic kidney disease with stage 5 chronic kidney disease or end stage renal disease: Secondary | ICD-10-CM | POA: Insufficient documentation

## 2023-03-12 DIAGNOSIS — R0602 Shortness of breath: Secondary | ICD-10-CM | POA: Diagnosis not present

## 2023-03-12 DIAGNOSIS — F1410 Cocaine abuse, uncomplicated: Secondary | ICD-10-CM | POA: Insufficient documentation

## 2023-03-12 DIAGNOSIS — R079 Chest pain, unspecified: Secondary | ICD-10-CM | POA: Insufficient documentation

## 2023-03-12 DIAGNOSIS — Z79899 Other long term (current) drug therapy: Secondary | ICD-10-CM | POA: Insufficient documentation

## 2023-03-12 DIAGNOSIS — R0989 Other specified symptoms and signs involving the circulatory and respiratory systems: Secondary | ICD-10-CM | POA: Diagnosis not present

## 2023-03-12 DIAGNOSIS — I517 Cardiomegaly: Secondary | ICD-10-CM | POA: Diagnosis not present

## 2023-03-12 MED ORDER — MUPIROCIN 2 % EX OINT: 1.0000 | TOPICAL_OINTMENT | Freq: Two times a day (BID) | CUTANEOUS | 0 refills | Status: DC
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
## 2023-03-12 NOTE — ED Notes (Signed)
Introduced self to pt and attached to partial monitor Pt is rambling and difficult to get hx from  Pt stated that 2 days ago he checked into the [employer] and got bugs. Pt stated that he has been trying to shave them out Pt wearing shorts and pointed to legs Noted multiple cuts to legs, no bites noted by this RN   Pt stated that he had a bug on the back of his head and under his LEFT arm and was washing them out with gasoline.

## 2023-03-12 NOTE — ED Triage Notes (Signed)
Pt was staying at a hotel 2 days ago, woke up with bites to bilateral legs and back of head.

## 2023-03-12 NOTE — Discharge Instructions (Addendum)
As discussed, recommend placing antibiotic ointment over areas where you are bitten or even scratching/scraping.  Watch for developing signs of infection as we discussed.  Follow-up for dialysis session tomorrow.  Please do not hesitate to return to emergency department for worrisome signs and symptoms we discussed become apparent.

## 2023-03-13 ENCOUNTER — Emergency Department (HOSPITAL_COMMUNITY)
Admission: EM | Admit: 2023-03-13 | Discharge: 2023-03-13 | Disposition: A | Discharge status: Home / Self Care | Payer: 59 | Source: Home / Self Care | Attending: Emergency Medicine | Admitting: Emergency Medicine

## 2023-03-13 ENCOUNTER — Other Ambulatory Visit: Payer: Self-pay

## 2023-03-13 ENCOUNTER — Emergency Department (HOSPITAL_COMMUNITY): Payer: 59

## 2023-03-13 ENCOUNTER — Encounter (HOSPITAL_COMMUNITY): Payer: Self-pay

## 2023-03-13 DIAGNOSIS — F1410 Cocaine abuse, uncomplicated: Secondary | ICD-10-CM

## 2023-03-13 DIAGNOSIS — Z992 Dependence on renal dialysis: Secondary | ICD-10-CM | POA: Diagnosis not present

## 2023-03-13 DIAGNOSIS — R079 Chest pain, unspecified: Secondary | ICD-10-CM

## 2023-03-13 DIAGNOSIS — D631 Anemia in chronic kidney disease: Secondary | ICD-10-CM

## 2023-03-13 DIAGNOSIS — R7989 Other specified abnormal findings of blood chemistry: Secondary | ICD-10-CM

## 2023-03-13 DIAGNOSIS — N186 End stage renal disease: Secondary | ICD-10-CM

## 2023-03-13 DIAGNOSIS — N189 Chronic kidney disease, unspecified: Secondary | ICD-10-CM

## 2023-03-13 DIAGNOSIS — R0602 Shortness of breath: Secondary | ICD-10-CM | POA: Diagnosis not present

## 2023-03-13 DIAGNOSIS — R0989 Other specified symptoms and signs involving the circulatory and respiratory systems: Secondary | ICD-10-CM | POA: Diagnosis not present

## 2023-03-13 DIAGNOSIS — I517 Cardiomegaly: Secondary | ICD-10-CM | POA: Diagnosis not present

## 2023-03-13 LAB — COMPREHENSIVE METABOLIC PANEL
ALT: 18 U/L (ref 0–44)
AST: 19 U/L (ref 15–41)
Albumin: 3.1 g/dL — ABNORMAL LOW (ref 3.5–5.0)
Alkaline Phosphatase: 52 U/L (ref 38–126)
Anion gap: 19 — ABNORMAL HIGH (ref 5–15)
BUN: 100 mg/dL — ABNORMAL HIGH (ref 6–20)
CO2: 20 mmol/L — ABNORMAL LOW (ref 22–32)
Calcium: 8 mg/dL — ABNORMAL LOW (ref 8.9–10.3)
Chloride: 98 mmol/L (ref 98–111)
Creatinine, Ser: 14.83 mg/dL — ABNORMAL HIGH (ref 0.61–1.24)
GFR, Estimated: 3 mL/min — ABNORMAL LOW (ref >60)
Glucose, Bld: 93 mg/dL (ref 70–99)
Potassium: 4.7 mmol/L (ref 3.5–5.1)
Sodium: 137 mmol/L (ref 135–145)
Total Bilirubin: 0.8 mg/dL (ref 0.3–1.2)
Total Protein: 6.6 g/dL (ref 6.5–8.1)

## 2023-03-13 LAB — CBC WITH DIFFERENTIAL/PLATELET
Abs Immature Granulocytes: 0.01 10*3/uL (ref 0.00–0.07)
Basophils Absolute: 0 10*3/uL (ref 0.0–0.1)
Basophils Relative: 1 %
Eosinophils Absolute: 0 10*3/uL (ref 0.0–0.5)
Eosinophils Relative: 1 %
HCT: 27.1 % — ABNORMAL LOW (ref 39.0–52.0)
Hemoglobin: 8.8 g/dL — ABNORMAL LOW (ref 13.0–17.0)
Immature Granulocytes: 0 %
Lymphocytes Relative: 16 %
Lymphs Abs: 0.9 10*3/uL (ref 0.7–4.0)
MCH: 30.2 pg (ref 26.0–34.0)
MCHC: 32.5 g/dL (ref 30.0–36.0)
MCV: 93.1 fL (ref 80.0–100.0)
Monocytes Absolute: 0.4 10*3/uL (ref 0.1–1.0)
Monocytes Relative: 8 %
Neutro Abs: 4.1 10*3/uL (ref 1.7–7.7)
Neutrophils Relative %: 74 %
Platelets: 253 10*3/uL (ref 150–400)
RBC: 2.91 MIL/uL — ABNORMAL LOW (ref 4.22–5.81)
RDW: 14.7 % (ref 11.5–15.5)
WBC: 5.5 10*3/uL (ref 4.0–10.5)
nRBC: 0 % (ref 0.0–0.2)

## 2023-03-13 LAB — TROPONIN I (HIGH SENSITIVITY)
Troponin I (High Sensitivity): 157 ng/L (ref <18)
Troponin I (High Sensitivity): 136 ng/L (ref <18)

## 2023-03-13 LAB — MAGNESIUM: Magnesium: 2.4 mg/dL (ref 1.7–2.4)

## 2023-03-13 MED ORDER — ACETAMINOPHEN 325 MG PO TABS
650.0000 mg | ORAL_TABLET | Freq: Once | ORAL | Status: AC
  Administered 2023-03-13: 650 mg via ORAL
  Filled 2023-03-13: qty 2

## 2023-03-13 NOTE — ED Notes (Signed)
Date and time results received: 03/13/23 0406  Test: TROPONIN Critical Value: 136  Name of Provider Notified: Preston Fleeting, MD

## 2023-03-13 NOTE — ED Triage Notes (Signed)
Pt arrived via POV c/o generalized body aches. Pt reports he has multiple bug bites on him. Pt verbally rambling in Triage, unsteady gait, Pt admits to drinking ETOH, and smoking crack tonight.

## 2023-03-13 NOTE — ED Provider Notes (Signed)
[facility] EMERGENCY DEPARTMENT AT [facility] Provider Note   CSN: [id number] Arrival date & time: 03/12/23  2352     History  Chief Complaint  Patient presents with   Generalized Body Aches    Damon Williams is a 62 y.o. male.  The history is provided by the patient.  He has history of hypertension, cardiomyopathy, end-stage renal disease on hemodialysis and states that he had missed dialysis yesterday.  He is complaining of of pain in the left side of his chest and back and abdomen since the afternoon, also complaining of some shortness of breath.  He admits that he had smoked some cocaine today.  He denies nausea or vomiting.  He had been seen earlier for some insect bites, but he states that those have improved.  He is scheduled for dialysis later this morning as he is supposed to be on dialysis 4 days a week.   Home Medications Prior to Admission medications   Medication Sig Start Date End Date Taking? Authorizing Provider  amLODipine (NORVASC) 10 MG tablet Take 1 tablet (10 mg total) by mouth daily. 02/07/23   Shon Hale, MD  calcium acetate (PHOSLO) 667 MG capsule Take 667 mg by mouth 3 (three) times daily with meals.    [provider]  carvedilol (COREG) 12.5 MG tablet Take 1 tablet (12.5 mg total) by mouth 2 (two) times daily with a meal. 02/07/23   Emokpae, Courage, MD  furosemide (LASIX) 80 MG tablet Take 80 mg by mouth See admin instructions. Take 80 mg 4 times a week on non-dialysis days 01/30/23   [provider]  hydrALAZINE (APRESOLINE) 25 MG tablet Take 1 tablet (25 mg total) by mouth 3 (three) times daily. 02/07/23   Shon Hale, MD  isosorbide mononitrate (IMDUR) 30 MG 24 hr tablet Take 0.5 tablets (15 mg total) by mouth daily. 02/07/23   Shon Hale, MD  mupirocin ointment (BACTROBAN) 2 % Apply 1 Application topically 2 (two) times daily. 03/12/23   Peter Garter, PA  traZODone (DESYREL) 100 MG tablet Take 100 mg by mouth at  bedtime.    [provider]      Allergies    Patient has no known allergies.    Review of Systems   Review of Systems  All other systems reviewed and are negative.   Physical Exam Updated Vital Signs BP (!) 156/101 (BP Location: Right Arm)   Temp 97.7 F (36.5 C) (Oral)   Resp 18   Ht 5\' 7"  (1.702 m)   Wt 63.5 kg   BMI 21.93 kg/m  Physical Exam Vitals and nursing note reviewed.   62 year old male, resting comfortably and in no acute distress. Vital signs are significant for elevated blood pressure. Oxygen saturation is 93%, which is normal. Head is normocephalic and atraumatic. PERRLA, EOMI. Oropharynx is clear. Neck is nontender and supple without adenopathy.  JVD is present. Back is nontender and there is no CVA tenderness. Lungs are clear without rales, wheezes, or rhonchi. Chest is mildly tender in the entire left side of the chest.  There is no crepitus.  Dialysis access catheter present on the right. Heart has regular rate and rhythm without murmur. Abdomen is soft, flat, nontender. Extremities have no cyanosis or edema, full range of motion is present.  AV fistula present in the left upper arm with thrill present. Skin is warm and dry without rash. Neurologic: Mental status is normal, cranial nerves are intact, moves all extremities  equally.  ED Results / Procedures / Treatments   Labs (all labs ordered are listed, but only abnormal results are displayed) Labs Reviewed  COMPREHENSIVE METABOLIC PANEL - Abnormal; Notable for the following components:      Result Value   CO2 20 (*)    BUN 100 (*)    Creatinine, Ser 14.83 (*)    Calcium 8.0 (*)    Albumin 3.1 (*)    GFR, Estimated 3 (*)    Anion gap 19 (*)    All other components within normal limits  CBC WITH DIFFERENTIAL/PLATELET - Abnormal; Notable for the following components:   RBC 2.91 (*)    Hemoglobin 8.8 (*)    HCT 27.1 (*)    All other components within normal limits  TROPONIN I (HIGH  SENSITIVITY) - Abnormal; Notable for the following components:   Troponin I (High Sensitivity) 157 (*)    All other components within normal limits  TROPONIN I (HIGH SENSITIVITY) - Abnormal; Notable for the following components:   Troponin I (High Sensitivity) 136 (*)    All other components within normal limits  MAGNESIUM  URINALYSIS, W/ REFLEX TO CULTURE (INFECTION SUSPECTED)    EKG EKG Interpretation Date/Time:  Monday March 13 2023 01:52:36 EDT Ventricular Rate:  93 PR Interval:  164 QRS Duration:  149 QT Interval:  387 QTC Calculation: 482 R Axis:   46  Text Interpretation: Sinus rhythm LVH with IVCD and secondary repol abnrm Borderline prolonged QT interval When compared with ECG of 02/11/2023, No significant change was found Confirmed by Dione Booze ([id number]) on 03/13/2023 2:00:04 AM  Radiology DG Chest 2 View  Result Date: 03/13/2023 CLINICAL DATA:  Shortness of breath EXAM: CHEST - 2 VIEW COMPARISON:  02/13/2023 FINDINGS: Right dialysis catheter remains in place, unchanged. Cardiomegaly. Mild vascular congestion. Improving bilateral airspace disease, likely improving edema. No confluent opacity or overt edema currently. No effusions. IMPRESSION: Cardiomegaly, vascular congestion. Electronically Signed   By: Charlett Nose M.D.   On: 03/13/2023 02:33    Procedures Procedures    Medications Ordered in ED Medications - No data to display  ED Course/ Medical Decision Making/ A&P                                 Medical Decision Making Amount and/or Complexity of Data Reviewed Labs: ordered. Radiology: ordered.  Risk OTC drugs.   Left-sided chest pain which is felt most likely to be related to cocaine use, but need to rule out ACS.  Doubt pulmonary embolism, pneumonia, pericarditis, pleurisy.  I have reviewed his past records, and echocardiogram 08/05/2022 showed concentric left ventricular hypertrophy, dilated left ventricle, reduced ejection fraction of 30-35%, grade 2  diastolic dysfunction.  I have ordered a dose of acetaminophen.  Following above-noted treatment, patient was resting comfortably.  Chest x-ray shows cardiomegaly and vascular congestion.  I have independently viewed the images, and agree with the radiologist's interpretation.  I have reviewed and interpreted his electrocardiogram, and my interpretation is left ventricular hypertrophy with secondary repolarization changes unchanged from prior.  I have reviewed his laboratory tests and my interpretation is stable anemia likely from end-stage renal disease, normal magnesium, slightly low CO2 consistent with end-stage renal disease and having missed dialysis, elevated BUN and creatinine consistent with known history of end-stage renal disease, elevated troponin which could be related to recent cocaine use and also related to end-stage renal disease, doubt ACS.  Repeat  troponin has actually decreased, no evidence of ACS.  He is safe for discharge.  I have encouraged him to abstain from cocaine use and to go for his scheduled dialysis sessions.  Final Clinical Impression(s) / ED Diagnoses Final diagnoses:  Nonspecific chest pain  Cocaine abuse (HCC)  Elevated troponin  End-stage renal disease on hemodialysis (HCC)  Anemia associated with chronic renal failure    Rx / DC Orders ED Discharge Orders     None         Dione Booze, MD 03/13/23 [phone]

## 2023-03-13 NOTE — ED Notes (Signed)
Pt reports missing dialysis yesterday. Pt moving around in treatment room during Triage, while undressing himself and throwing his clothes in the trash can. By the end of Triage, Pt was provided a hospital gown so he could clothes himself. Pt insists there are bugs constantly biting him.

## 2023-03-13 NOTE — ED Notes (Signed)
Pt given a urinal and informed a urine sample is needed. Pt stated "I already peed today. It won't happen again."

## 2023-03-13 NOTE — Discharge Instructions (Signed)
Go for your dialysis sessions as scheduled.  Do not use cocaine.  It can cause a heart attack and kill you.

## 2023-03-14 DIAGNOSIS — Z992 Dependence on renal dialysis: Secondary | ICD-10-CM | POA: Diagnosis not present

## 2023-03-14 DIAGNOSIS — N186 End stage renal disease: Secondary | ICD-10-CM | POA: Diagnosis not present

## 2023-03-15 ENCOUNTER — Telehealth: Payer: Self-pay | Admitting: Nurse Practitioner

## 2023-03-15 ENCOUNTER — Ambulatory Visit: Payer: 59 | Attending: Nurse Practitioner | Admitting: Nurse Practitioner

## 2023-03-15 ENCOUNTER — Encounter: Payer: Self-pay | Admitting: Nurse Practitioner

## 2023-03-15 VITALS — BP 134/74 | HR 85 | Ht 67.5 in | Wt 141.4 lb

## 2023-03-15 DIAGNOSIS — R634 Abnormal weight loss: Secondary | ICD-10-CM

## 2023-03-15 DIAGNOSIS — I340 Nonrheumatic mitral (valve) insufficiency: Secondary | ICD-10-CM | POA: Diagnosis not present

## 2023-03-15 DIAGNOSIS — N186 End stage renal disease: Secondary | ICD-10-CM | POA: Diagnosis not present

## 2023-03-15 DIAGNOSIS — I428 Other cardiomyopathies: Secondary | ICD-10-CM

## 2023-03-15 DIAGNOSIS — Z720 Tobacco use: Secondary | ICD-10-CM | POA: Diagnosis not present

## 2023-03-15 DIAGNOSIS — I77819 Aortic ectasia, unspecified site: Secondary | ICD-10-CM | POA: Diagnosis not present

## 2023-03-15 DIAGNOSIS — I3139 Other pericardial effusion (noninflammatory): Secondary | ICD-10-CM

## 2023-03-15 DIAGNOSIS — F1991 Other psychoactive substance use, unspecified, in remission: Secondary | ICD-10-CM

## 2023-03-15 DIAGNOSIS — I43 Cardiomyopathy in diseases classified elsewhere: Secondary | ICD-10-CM | POA: Diagnosis not present

## 2023-03-15 DIAGNOSIS — I10 Essential (primary) hypertension: Secondary | ICD-10-CM | POA: Diagnosis not present

## 2023-03-15 DIAGNOSIS — W57XXXD Bitten or stung by nonvenomous insect and other nonvenomous arthropods, subsequent encounter: Secondary | ICD-10-CM

## 2023-03-15 DIAGNOSIS — I110 Hypertensive heart disease with heart failure: Secondary | ICD-10-CM

## 2023-03-15 DIAGNOSIS — Z992 Dependence on renal dialysis: Secondary | ICD-10-CM

## 2023-03-15 DIAGNOSIS — I509 Heart failure, unspecified: Secondary | ICD-10-CM

## 2023-03-15 MED ORDER — FUROSEMIDE 40 MG PO TABS: 40.0000 mg | ORAL_TABLET | ORAL | 5 refills | Status: AC

## 2023-03-15 NOTE — Patient Instructions (Addendum)
Medication Instructions:  Your physician has recommended you make the following change in your medication:  Reduce Lasix to 40 Mg on Non- Dialysis Days as needed for Weight gain, swelling and SOB Continue all other medications as prescribed   Labwork: None  Testing/Procedures: None   Follow-Up: Your physician recommends that you schedule a follow-up appointment in: 3 Month telephone visit with Philis Nettle   Any Other Special Instructions Will Be Listed Below (If Applicable).  If you need a refill on your cardiac medications before your next appointment, please call your pharmacy.

## 2023-03-15 NOTE — Progress Notes (Addendum)
Cardiology Office Note:  .   Date:  03/15/2023 ID:  Damon Williams, DOB [id number], MRN [id number] PCP: Kara Pacer, NP  [facility] Providers Cardiologist:  Dina Rich, MD    History of Present Illness: .   Damon Williams is a 62 y.o. male with a PMH of HTN, ESRD, hx of polysubstance abuse (current cocaine user, current smoker, former ETOH abuse), pericardial effusion, MR, aortic dilatation, and cardiomyopathy, who is seen today for follow-up. History of recent frequent ED visits and hospitalizations noted below.  Admitted 10/2022 for uncontrolled HTN, AKI on CKD stage IV, found to have new onset cardiomyopathy with EF found to be 30-35%, grade 2 DD with small pericardial effusion. Evaluated by Cards, Dr. Wyline Mood stated not to start ACE/ARB/ARNI/MRA/SGLT2i given renal dysfunction.   Readmitted 11/2022 for shortness of breath, found to be in volume overload in setting of AKI on CKD stage IV -> ESRD, was started on HD. Established dialysis with [facility] MWF schedule.   I last saw him for hospital follow-up on January 20, 2023. Was overall doing well. Reported he stopped cocaine, alcohol, and tobacco.  Was undergoing HD on T/Th/Sat.  Following Dr. Wolfgang Phoenix. Denied any CP, SHOB, palpitations, syncope, presyncope, dizziness, orthopnea, PND, significant weight changes, acute bleeding, or claudication.  Admitted to leg edema, stable over time.  BP was mildly elevated as he had not taken his BP medications prior to office visit. BP at home typically well controlled.  Since I last saw patient, he has had multiple ED visits and 2 hospital admissions. He was admitted 01/27/2023 - 01/28/2023 for volume overload due to not completing serial dialysis treatments.  Also noted to have symptoms and findings of acute respiratory failure with hypoxia requiring 2 L of oxygen via nasal cannula. Pt also noted palpitations.  It was reported that patient had been reducing his hemodialysis sessions to 3  hours instead of 4 hours due to obligations at home to take care of his parents.  He noted peripheral edema for a couple of weeks with very minimal urine output that was normal for him.  He was discharged after he received hemodialysis in the hospital.  Hospital admission 02/05/2023 - 02/07/2023 for acute hypoxic respiratory failure due to volume overload status in the setting of not having his full dialysis session the previous Saturday.  During the hospital course, he required up to 5 L of oxygen via nasal cannula and hypoxia resolved after receiving hemodialysis.  ED visit 02/11/2023 for "evaluation for shortness of breath."  It was noted that patient completed dialysis treatment, O2 sats at the facility were in the 80s and was advised to come to the hospital.  Patient denied feeling short of breath at the time.  O2 sats in the ED were in the lower to mid 90s.  Patient denied any chest pain, with no increased work of breathing on exam.  He ambulated in the department and O2 dropped temporarily into the upper 80s but quickly improved to low 90s.  No persistent hypoxia and patient requested to go home.  Was d/c to home.  ED visit the following day for shortness of breath.  O2 sats in 80s or low 90s on room air.  Chest x-ray revealed volume overload versus infiltrate.  ED physician personally ambulated patient in emergency department and appeared clinically well, noted patient did get somewhat winded with oxygen saturations dropping to 90% with exertion.  Dr. Judd Lien reported on ED note from 02/12/2023 " patient is adamant  about not being admitted.  He is requesting to be discharged so that he can go to his dialysis center and have them dialyze him." Patient was d/c upon his request.  More recently, he presented to the ED on March 12, 2023 for an insect bite. Was staying in [facility] [place] and reported seeing bugs all over his body.  Patient reported having scrapes all over lower extremities and along some of his upper  extremities.  Denied any fever or chills.  There is no clinical evidence of secondary infectious process.  Was treated with topical antibiotics and recommended antihistamines for pruritus and was recommended follow-up with outpatient primary care.  He soon returned back to the emergency department and noted generalized body aches on 03/12/2023. Did admit to smoking cocaine that day. Left sided chest pain that patient endorsed was felt most likely to be r/t to his cocaine use. CXR revealed cardiomegaly and vascular congestion. Elevated trop likely r/t recent cocaine use and ESRD, was doubted to be ACS. Repeat troponin decreased. Encouraged to abstain from cocaine and D/C to home.   Today he presents for follow-up. He has lost 13 lbs since I last saw him, has lost a total of 35 lbs unintentionally since April/May of this year. He is losing weight despite eating 3 meals per day. Goes to dialysis several days per week. When I asked him regarding the recent insects, he clarifies that he were not bed bugs but were "white bugs." He draws a picture for me on a piece of paper, says there were white bugs. Says these bugs "sucked the blood" out of him, his description sounds difficult to determine what insect this was. Says he poured gasoline several times on him to get rid of them. Says he has not had any more issues with insects after now being out of the [facility] [place]. Denies any more cocaine use. Only smokes about one cigarette per his report. Denies any alcohol use. Denies any chest pain, shortness of breath, palpitations, syncope, presyncope, dizziness, orthopnea, PND, worsening swelling or recent bleeding, or claudication. Requesting to see a new PCP.   Family History: Hypercholesterolemia: Mother and Father Stroke: Paternal Grandfather HTN: Brother and Sister  Studies Reviewed: .    Echo 10/2022: 1. Left ventricular ejection fraction, by estimation, is 30 to 35%. The  left ventricle has moderately decreased  function. The left ventricle  demonstrates global hypokinesis. The left ventricular internal cavity size  was mildly to moderately dilated.  There is mild concentric left ventricular hypertrophy. Left ventricular  diastolic parameters are consistent with Grade II diastolic dysfunction  (pseudonormalization).   2. Right ventricular systolic function is low normal. The right  ventricular size is normal. Tricuspid regurgitation signal is inadequate  for assessing PA pressure.   3. Left atrial size was moderately dilated.   4. Right atrial size was mildly dilated.   5. A small pericardial effusion is present. The pericardial effusion is  posterior to the left ventricle.   6. The mitral valve is grossly normal, mildly thickened. Mild mitral  valve regurgitation.   7. The aortic valve is tricuspid. Aortic valve regurgitation is not  visualized.   8. Aortic dilatation noted. There is moderate dilatation of the aortic  root, measuring 43 mm.   9. The inferior vena cava is dilated in size with >50% respiratory  variability, suggesting right atrial pressure of 8 mmHg.   Comparison(s): No prior Echocardiogram. Consider hypertensive versus  infiltrative cardiomyopathy.  Risk Assessment/Calculations:  The 10-year ASCVD risk score (Arnett DK, et al., 2019) is: 24.2%   Values used to calculate the score:     Age: 62 years     Sex: Male     Is Non-Hispanic African American: Yes     Diabetic: No     Tobacco smoker: Yes     Systolic Blood Pressure: 134 mmHg     Is BP treated: Yes     HDL Cholesterol: 31 mg/dL     Total Cholesterol: 151 mg/dL      Physical Exam:   VS:  BP 134/74   Pulse 85   Ht 5' 7.5" (1.715 m)   Wt 141 lb 6.4 oz (64.1 kg)   SpO2 96%   BMI 21.82 kg/m    Wt Readings from Last 3 Encounters:  03/15/23 141 lb 6.4 oz (64.1 kg)  03/13/23 139 lb 15.9 oz (63.5 kg)  03/12/23 140 lb (63.5 kg)    GEN: Well nourished, well developed in no acute distress NECK: No JVD; No  carotid bruits CARDIAC: S1/S2, RRR, no murmurs, rubs, gallops RESPIRATORY:  Clear to auscultation without rales, wheezing or rhonchi  ABDOMEN: Soft, non-tender, non-distended EXTREMITIES:  No edema; No deformity, graft along LUE is waiting to mature, multiple superficial abrasions to BLE with some surrounding excoriations. No surrounding erythema noted. No bugs seen on patient today.   ASSESSMENT AND PLAN: .    Hypertensive cardiomyopathy, NICM Diagnosed 10/2022. Stage C, NYHA class I symptoms. TTE revealed EF 30-35%, grade 2 DD, was recommended to consider hypertensive vs infiltrative CM. No FH of CHF in his family per his report. Overall euvolemic and well compensated on exam. GDMT limited d/t ESRD. Continue Coreg, hydralazine, and Imdur. Will reduce Lasix to 40 mg daily PRN on non-dialysis days d/t weight loss. He will take if he notices SHOB, swelling, or weight gain.  Low sodium diet, fluid restriction <2L, and daily weights encouraged. Educated to contact our office for weight gain of 2 lbs overnight or 5 lbs in one week. No longer using cocaine after recent relapse, I congratulated him. Previously case d/w Dr. Wyline Mood who agreed that cardiac rehab referral would be fine. Discussed this with patient who is agreeable to this. Will place referral for him. Continue HD as scheduled. Care and ED precautions discussed.    Cardiac Rehabilitation Eligibility Assessment  The patient is ready to start cardiac rehabilitation from a cardiac standpoint.    HTN BP mildly elevated today. States BP well controlled at home. Discussed to monitor BP at home at least 2 hours after medications and sitting for 5-10 minutes. Goal SBP < 130. Continue medication regimen. Low salt, heart healthy diet encouraged.   3. Pericardial effusion Small pericardial effusion noted on TTE, pt denies any red flag symptoms. Will continue to monitor at this time. ED precautions discussed.   4. Mitral regurgitation Mild mitral  regurgitation noted on TTE. Will continue to monitor over time with serial echos.   5. Aortic dilatation Moderate dilatation of aortic root at 43 mm. Will discuss updating Echo at future OV after cardiac rehab. Care and ED precautions discussed. Heart healthy diet encouraged.   6. ESRD on HD Currently on a Tues/Thurs/Sat schedule for HD and getting treatments through port on right upper chest, current graft along left upper arm is waiting to mature, following [facility]. No medication changes at this time. Continue to follow-up with Dr. Wolfgang Phoenix.   7. Tobacco use, hx of drug use Congratulated him on stopping cocaine  and discussed risks of cocaine use if he were to continue using it. Smoking cessation encouraged and discussed.   8. Insect bites See PE mentioned above. No longer living in [address] and says he no longer is encountering the same insects. Patient denies any fever, chills, N/V/D and no signs of cellulitis or infection on exam. Recommended to follow-up with PCP. Patient requesting new PCP, will provide referral for him.   9. Unintentional weight loss Has lost around 35 lbs since April/May unintentionally. Etiology unknown. Reduced Lasix as mentioned above. Referring patient to PCP for further evaluation.   During office visit, this NP used PPE during the encounter.  Dispo: Follow-up with me or APP in 3 months via telephone visit or sooner if anything changes.   Signed, Sharlene Dory, NP

## 2023-03-16 DIAGNOSIS — N186 End stage renal disease: Secondary | ICD-10-CM | POA: Diagnosis not present

## 2023-03-16 DIAGNOSIS — Z992 Dependence on renal dialysis: Secondary | ICD-10-CM | POA: Diagnosis not present

## 2023-03-18 DIAGNOSIS — E8779 Other fluid overload: Secondary | ICD-10-CM | POA: Diagnosis not present

## 2023-03-18 DIAGNOSIS — Z992 Dependence on renal dialysis: Secondary | ICD-10-CM | POA: Diagnosis not present

## 2023-03-18 DIAGNOSIS — N186 End stage renal disease: Secondary | ICD-10-CM | POA: Diagnosis not present

## 2023-03-20 ENCOUNTER — Encounter: Payer: Self-pay | Admitting: Emergency Medicine

## 2023-03-21 DIAGNOSIS — N186 End stage renal disease: Secondary | ICD-10-CM | POA: Diagnosis not present

## 2023-03-21 DIAGNOSIS — Z992 Dependence on renal dialysis: Secondary | ICD-10-CM | POA: Diagnosis not present

## 2023-03-23 DIAGNOSIS — N186 End stage renal disease: Secondary | ICD-10-CM | POA: Diagnosis not present

## 2023-03-23 DIAGNOSIS — Z992 Dependence on renal dialysis: Secondary | ICD-10-CM | POA: Diagnosis not present

## 2023-03-24 ENCOUNTER — Ambulatory Visit: Payer: 59

## 2023-03-24 ENCOUNTER — Ambulatory Visit (HOSPITAL_COMMUNITY): Payer: 59 | Attending: Vascular Surgery

## 2023-03-25 DIAGNOSIS — Z992 Dependence on renal dialysis: Secondary | ICD-10-CM | POA: Diagnosis not present

## 2023-03-25 DIAGNOSIS — N186 End stage renal disease: Secondary | ICD-10-CM | POA: Diagnosis not present

## 2023-03-28 DIAGNOSIS — Z992 Dependence on renal dialysis: Secondary | ICD-10-CM | POA: Diagnosis not present

## 2023-03-28 DIAGNOSIS — N186 End stage renal disease: Secondary | ICD-10-CM | POA: Diagnosis not present

## 2023-03-30 DIAGNOSIS — N186 End stage renal disease: Secondary | ICD-10-CM | POA: Diagnosis not present

## 2023-03-30 DIAGNOSIS — Z992 Dependence on renal dialysis: Secondary | ICD-10-CM | POA: Diagnosis not present

## 2023-04-01 DIAGNOSIS — Z992 Dependence on renal dialysis: Secondary | ICD-10-CM | POA: Diagnosis not present

## 2023-04-01 DIAGNOSIS — N186 End stage renal disease: Secondary | ICD-10-CM | POA: Diagnosis not present

## 2023-04-04 DIAGNOSIS — N186 End stage renal disease: Secondary | ICD-10-CM | POA: Diagnosis not present

## 2023-04-04 DIAGNOSIS — Z992 Dependence on renal dialysis: Secondary | ICD-10-CM | POA: Diagnosis not present

## 2023-04-08 DIAGNOSIS — Z992 Dependence on renal dialysis: Secondary | ICD-10-CM | POA: Diagnosis not present

## 2023-04-08 DIAGNOSIS — N186 End stage renal disease: Secondary | ICD-10-CM | POA: Diagnosis not present

## 2023-04-11 DIAGNOSIS — N186 End stage renal disease: Secondary | ICD-10-CM | POA: Diagnosis not present

## 2023-04-11 DIAGNOSIS — Z992 Dependence on renal dialysis: Secondary | ICD-10-CM | POA: Diagnosis not present

## 2023-04-13 ENCOUNTER — Other Ambulatory Visit: Payer: Self-pay

## 2023-04-13 ENCOUNTER — Emergency Department (HOSPITAL_COMMUNITY)
Admission: EM | Admit: 2023-04-13 | Discharge: 2023-04-13 | Discharge status: Against Medical Advice | Payer: 59 | Attending: Emergency Medicine | Admitting: Emergency Medicine

## 2023-04-13 ENCOUNTER — Encounter (HOSPITAL_COMMUNITY): Payer: Self-pay

## 2023-04-13 ENCOUNTER — Ambulatory Visit (HOSPITAL_COMMUNITY): Admission: RE | Admit: 2023-04-13 | Payer: 59 | Source: Ambulatory Visit

## 2023-04-13 DIAGNOSIS — R0602 Shortness of breath: Secondary | ICD-10-CM | POA: Insufficient documentation

## 2023-04-13 DIAGNOSIS — R059 Cough, unspecified: Secondary | ICD-10-CM | POA: Insufficient documentation

## 2023-04-13 DIAGNOSIS — Z992 Dependence on renal dialysis: Secondary | ICD-10-CM | POA: Diagnosis not present

## 2023-04-13 DIAGNOSIS — Z5321 Procedure and treatment not carried out due to patient leaving prior to being seen by health care provider: Secondary | ICD-10-CM | POA: Insufficient documentation

## 2023-04-13 DIAGNOSIS — N186 End stage renal disease: Secondary | ICD-10-CM | POA: Diagnosis not present

## 2023-04-13 DIAGNOSIS — M7989 Other specified soft tissue disorders: Secondary | ICD-10-CM | POA: Diagnosis not present

## 2023-04-13 NOTE — ED Triage Notes (Signed)
Pt c/o SOB after missing dialysis yesterday. Pt c/o swelling in bilateral extremities. Also states that he has had a cough x2 days.

## 2023-04-14 DIAGNOSIS — N186 End stage renal disease: Secondary | ICD-10-CM | POA: Diagnosis not present

## 2023-04-14 DIAGNOSIS — Z992 Dependence on renal dialysis: Secondary | ICD-10-CM | POA: Diagnosis not present

## 2023-04-15 DIAGNOSIS — N186 End stage renal disease: Secondary | ICD-10-CM | POA: Diagnosis not present

## 2023-04-15 DIAGNOSIS — Z992 Dependence on renal dialysis: Secondary | ICD-10-CM | POA: Diagnosis not present

## 2023-04-18 DIAGNOSIS — N186 End stage renal disease: Secondary | ICD-10-CM | POA: Diagnosis not present

## 2023-04-18 DIAGNOSIS — Z992 Dependence on renal dialysis: Secondary | ICD-10-CM | POA: Diagnosis not present

## 2023-04-20 DIAGNOSIS — N186 End stage renal disease: Secondary | ICD-10-CM | POA: Diagnosis not present

## 2023-04-20 DIAGNOSIS — Z992 Dependence on renal dialysis: Secondary | ICD-10-CM | POA: Diagnosis not present

## 2023-04-20 NOTE — Telephone Encounter (Signed)
error 

## 2023-04-22 DIAGNOSIS — Z992 Dependence on renal dialysis: Secondary | ICD-10-CM | POA: Diagnosis not present

## 2023-04-22 DIAGNOSIS — N186 End stage renal disease: Secondary | ICD-10-CM | POA: Diagnosis not present

## 2023-04-24 ENCOUNTER — Telehealth (HOSPITAL_COMMUNITY): Payer: Self-pay | Admitting: *Deleted

## 2023-04-25 DIAGNOSIS — Z992 Dependence on renal dialysis: Secondary | ICD-10-CM | POA: Diagnosis not present

## 2023-04-25 DIAGNOSIS — Z131 Encounter for screening for diabetes mellitus: Secondary | ICD-10-CM | POA: Diagnosis not present

## 2023-04-25 DIAGNOSIS — N186 End stage renal disease: Secondary | ICD-10-CM | POA: Diagnosis not present

## 2023-04-27 DIAGNOSIS — N186 End stage renal disease: Secondary | ICD-10-CM | POA: Diagnosis not present

## 2023-04-27 DIAGNOSIS — Z992 Dependence on renal dialysis: Secondary | ICD-10-CM | POA: Diagnosis not present

## 2023-04-28 DIAGNOSIS — Z992 Dependence on renal dialysis: Secondary | ICD-10-CM | POA: Diagnosis not present

## 2023-04-28 DIAGNOSIS — E8779 Other fluid overload: Secondary | ICD-10-CM | POA: Diagnosis not present

## 2023-04-28 DIAGNOSIS — N186 End stage renal disease: Secondary | ICD-10-CM | POA: Diagnosis not present

## 2023-04-29 ENCOUNTER — Emergency Department (HOSPITAL_COMMUNITY)
Admission: EM | Admit: 2023-04-29 | Discharge: 2023-04-29 | Discharge status: Against Medical Advice | Payer: 59 | Source: Home / Self Care

## 2023-04-29 DIAGNOSIS — Z992 Dependence on renal dialysis: Secondary | ICD-10-CM | POA: Diagnosis not present

## 2023-04-29 DIAGNOSIS — N186 End stage renal disease: Secondary | ICD-10-CM | POA: Diagnosis not present

## 2023-05-02 DIAGNOSIS — N186 End stage renal disease: Secondary | ICD-10-CM | POA: Diagnosis not present

## 2023-05-02 DIAGNOSIS — Z992 Dependence on renal dialysis: Secondary | ICD-10-CM | POA: Diagnosis not present

## 2023-05-04 DIAGNOSIS — N186 End stage renal disease: Secondary | ICD-10-CM | POA: Diagnosis not present

## 2023-05-04 DIAGNOSIS — Z992 Dependence on renal dialysis: Secondary | ICD-10-CM | POA: Diagnosis not present

## 2023-05-05 DIAGNOSIS — N186 End stage renal disease: Secondary | ICD-10-CM | POA: Diagnosis not present

## 2023-05-05 DIAGNOSIS — Z452 Encounter for adjustment and management of vascular access device: Secondary | ICD-10-CM | POA: Diagnosis not present

## 2023-05-05 DIAGNOSIS — Z992 Dependence on renal dialysis: Secondary | ICD-10-CM | POA: Diagnosis not present

## 2023-05-06 DIAGNOSIS — Z992 Dependence on renal dialysis: Secondary | ICD-10-CM | POA: Diagnosis not present

## 2023-05-06 DIAGNOSIS — N186 End stage renal disease: Secondary | ICD-10-CM | POA: Diagnosis not present

## 2023-05-07 ENCOUNTER — Emergency Department (HOSPITAL_COMMUNITY)
Admission: EM | Admit: 2023-05-07 | Discharge: 2023-05-07 | Disposition: A | Discharge status: Home / Self Care | Payer: 59 | Attending: Emergency Medicine | Admitting: Emergency Medicine

## 2023-05-07 ENCOUNTER — Other Ambulatory Visit: Payer: Self-pay

## 2023-05-07 ENCOUNTER — Encounter (HOSPITAL_COMMUNITY): Payer: Self-pay | Admitting: Radiology

## 2023-05-07 ENCOUNTER — Emergency Department (HOSPITAL_COMMUNITY): Payer: 59

## 2023-05-07 DIAGNOSIS — R2240 Localized swelling, mass and lump, unspecified lower limb: Secondary | ICD-10-CM | POA: Diagnosis not present

## 2023-05-07 DIAGNOSIS — M7989 Other specified soft tissue disorders: Secondary | ICD-10-CM

## 2023-05-07 DIAGNOSIS — R0602 Shortness of breath: Secondary | ICD-10-CM | POA: Diagnosis not present

## 2023-05-07 DIAGNOSIS — R0989 Other specified symptoms and signs involving the circulatory and respiratory systems: Secondary | ICD-10-CM | POA: Diagnosis not present

## 2023-05-07 DIAGNOSIS — R600 Localized edema: Secondary | ICD-10-CM | POA: Diagnosis not present

## 2023-05-07 NOTE — ED Provider Notes (Signed)
[place] EMERGENCY DEPARTMENT AT [facility] Provider Note   CSN: [id number] Arrival date & time: 05/07/23  2055     History  Chief Complaint  Patient presents with   Leg Swelling   Shortness of Breath    Damon Williams is a 62 y.o. male.  Is a 62 year old male is here today for lower extremity swelling.  Patient gets Tuesday Thursday Saturday dialysis, had 3-hour session yesterday.  He says that he drank too much fluid today which is why he thinks he has swelling in his legs.  Patient does not want any labs done, he wants [place] to arrange for him to get dialysis at his usual center tomorrow morning.  He does not feel short of breath.   Shortness of Breath      Home Medications Prior to Admission medications   Medication Sig Start Date End Date Taking? Authorizing Provider  amLODipine (NORVASC) 10 MG tablet Take 1 tablet (10 mg total) by mouth daily. 02/07/23   Shon Hale, MD  carvedilol (COREG) 12.5 MG tablet Take 1 tablet (12.5 mg total) by mouth 2 (two) times daily with a meal. 02/07/23   Emokpae, Courage, MD  furosemide (LASIX) 40 MG tablet Take 1 tablet (40 mg total) by mouth See admin instructions. Take 40 mg 4 times a week on non-dialysis days PRN for swelling,SOB, weight gain 03/15/23   Sharlene Dory, NP  hydrALAZINE (APRESOLINE) 25 MG tablet Take 1 tablet (25 mg total) by mouth 3 (three) times daily. 02/07/23   Shon Hale, MD  isosorbide mononitrate (IMDUR) 30 MG 24 hr tablet Take 0.5 tablets (15 mg total) by mouth daily. 02/07/23   Shon Hale, MD  mupirocin ointment (BACTROBAN) 2 % Apply 1 Application topically 2 (two) times daily. 03/12/23   Peter Garter, PA  traZODone (DESYREL) 100 MG tablet Take 100 mg by mouth at bedtime.    [provider]      Allergies    Patient has no known allergies.    Review of Systems   Review of Systems  Respiratory:  Positive for shortness of breath.     Physical Exam Updated Vital Signs BP  (!) 133/90   Pulse (!) 105   Temp 98.8 F (37.1 C) (Oral)   Resp 18   Ht 5\' 7"  (1.702 m)   Wt 64 kg   SpO2 97%   BMI 22.08 kg/m  Physical Exam Vitals reviewed.  Cardiovascular:     Comments: 2+ bilateral lower extremity edema Pulmonary:     Effort: Pulmonary effort is normal.     Breath sounds: No decreased breath sounds, wheezing or rales.  Neurological:     General: No focal deficit present.     Mental Status: He is alert.     ED Results / Procedures / Treatments   Labs (all labs ordered are listed, but only abnormal results are displayed) Labs Reviewed - No data to display  EKG EKG Interpretation Date/Time:  Sunday May 07 2023 21:24:58 EDT Ventricular Rate:  104 PR Interval:  164 QRS Duration:  88 QT Interval:  358 QTC Calculation: 470 R Axis:   68  Text Interpretation: Sinus tachycardia Possible Left atrial enlargement Left ventricular hypertrophy with repolarization abnormality ( Sokolow-Lyon , Cornell product , Romhilt-Estes ) Abnormal ECG When compared with ECG of 13-Apr-2023 03:35, Questionable change in QRS axis ST more depressed Lateral leads Nonspecific T wave abnormality now evident in Inferior leads T wave inversion less evident in Lateral leads  Confirmed by Anders Simmonds [phone]) on 05/07/2023 10:12:55 PM  Radiology DG Chest 2 View  Result Date: 05/07/2023 CLINICAL DATA:  Short of breath, left-sided pain EXAM: CHEST - 2 VIEW COMPARISON:  03/13/2023 FINDINGS: Frontal and lateral views of the chest demonstrate an enlarged cardiac silhouette. There is increased pulmonary vascular congestion, with bibasilar airspace disease and small effusions consistent with congestive heart failure. No pneumothorax. No acute bony abnormality. IMPRESSION: 1. Findings consistent with congestive heart failure. Electronically Signed   By: Sharlet Salina M.D.   On: 05/07/2023 22:26    Procedures Procedures    Medications Ordered in ED Medications - No data to  display  ED Course/ Medical Decision Making/ A&P                                 Medical Decision Making 62 year old male here today because he wants [place] to help arrange his dialysis tomorrow.  Plan-since patient does not want any labs, does not appear to be grossly fluid overloaded, I did reach out to our on-call nephrology group.  They said that they are not affiliated with where this patient gets dialysis, the typical process by which the patient would be able to do this is to call them tomorrow morning to see if they can dialyze him.   I offered admission to the patient, however he preferred to try to get his dialysis done tomorrow.  Patient understood risks and benefits of returning home with some mild fluid overload.  Caution the patient on avoiding fluid intake.  Patient knows he can return to the emergency department tomorrow if he is unable to get dialysis.  My independent review the patient's chest x-ray shows mild pulmonary edema.  Amount and/or Complexity of Data Reviewed Radiology: ordered.           Final Clinical Impression(s) / ED Diagnoses Final diagnoses:  Leg swelling  Peripheral edema    Rx / DC Orders ED Discharge Orders     None         Arletha Pili, DO 05/07/23 2236

## 2023-05-07 NOTE — ED Triage Notes (Signed)
Pt states legs started swelling on Saturday and got worse today. Was told yesterday by the dialysis nurse that if they continued to swell he should be evaluated. Pt states he feels short of breath today and usually does when his legs swell like this.

## 2023-05-07 NOTE — Discharge Instructions (Signed)
When you call your dialysis center tomorrow, you can tell them that you were evaluated in the emergency department, and the chest x-ray that you had done showed some pulmonary edema.  The ED physician who saw you believe that you should get dialysis on Monday at the latest.  If they are unable to get you in for dialysis, you can return to the emergency department for treatment.  Come back to the emergency department if develop any difficulty with your breathing.  Avoid drinking too many liquids tonight.

## 2023-05-08 ENCOUNTER — Emergency Department (HOSPITAL_COMMUNITY)
Admission: EM | Admit: 2023-05-08 | Discharge: 2023-05-08 | Disposition: A | Discharge status: Home / Self Care | Payer: 59 | Attending: Emergency Medicine | Admitting: Emergency Medicine

## 2023-05-08 ENCOUNTER — Other Ambulatory Visit: Payer: Self-pay

## 2023-05-08 ENCOUNTER — Emergency Department (HOSPITAL_COMMUNITY): Payer: 59

## 2023-05-08 DIAGNOSIS — M7989 Other specified soft tissue disorders: Secondary | ICD-10-CM | POA: Diagnosis not present

## 2023-05-08 DIAGNOSIS — Z79899 Other long term (current) drug therapy: Secondary | ICD-10-CM | POA: Diagnosis not present

## 2023-05-08 DIAGNOSIS — N186 End stage renal disease: Secondary | ICD-10-CM | POA: Insufficient documentation

## 2023-05-08 DIAGNOSIS — I120 Hypertensive chronic kidney disease with stage 5 chronic kidney disease or end stage renal disease: Secondary | ICD-10-CM | POA: Diagnosis not present

## 2023-05-08 DIAGNOSIS — Z992 Dependence on renal dialysis: Secondary | ICD-10-CM | POA: Diagnosis not present

## 2023-05-08 DIAGNOSIS — R140 Abdominal distension (gaseous): Secondary | ICD-10-CM | POA: Diagnosis not present

## 2023-05-08 DIAGNOSIS — J811 Chronic pulmonary edema: Secondary | ICD-10-CM | POA: Diagnosis not present

## 2023-05-08 DIAGNOSIS — J90 Pleural effusion, not elsewhere classified: Secondary | ICD-10-CM | POA: Diagnosis not present

## 2023-05-08 DIAGNOSIS — R2243 Localized swelling, mass and lump, lower limb, bilateral: Secondary | ICD-10-CM | POA: Diagnosis not present

## 2023-05-08 DIAGNOSIS — R600 Localized edema: Secondary | ICD-10-CM | POA: Insufficient documentation

## 2023-05-08 LAB — CBC WITH DIFFERENTIAL/PLATELET
Abs Immature Granulocytes: 0.02 10*3/uL (ref 0.00–0.07)
Basophils Absolute: 0 10*3/uL (ref 0.0–0.1)
Basophils Relative: 1 %
Eosinophils Absolute: 0.1 10*3/uL (ref 0.0–0.5)
Eosinophils Relative: 1 %
HCT: 30 % — ABNORMAL LOW (ref 39.0–52.0)
Hemoglobin: 9.7 g/dL — ABNORMAL LOW (ref 13.0–17.0)
Immature Granulocytes: 1 %
Lymphocytes Relative: 18 %
Lymphs Abs: 0.8 10*3/uL (ref 0.7–4.0)
MCH: 31 pg (ref 26.0–34.0)
MCHC: 32.3 g/dL (ref 30.0–36.0)
MCV: 95.8 fL (ref 80.0–100.0)
Monocytes Absolute: 0.5 10*3/uL (ref 0.1–1.0)
Monocytes Relative: 10 %
Neutro Abs: 3.1 10*3/uL (ref 1.7–7.7)
Neutrophils Relative %: 69 %
Platelets: 302 10*3/uL (ref 150–400)
RBC: 3.13 MIL/uL — ABNORMAL LOW (ref 4.22–5.81)
RDW: 16.8 % — ABNORMAL HIGH (ref 11.5–15.5)
WBC: 4.4 10*3/uL (ref 4.0–10.5)
nRBC: 0 % (ref 0.0–0.2)

## 2023-05-08 LAB — BASIC METABOLIC PANEL
Anion gap: 15 (ref 5–15)
BUN: 64 mg/dL — ABNORMAL HIGH (ref 6–20)
CO2: 22 mmol/L (ref 22–32)
Calcium: 7.8 mg/dL — ABNORMAL LOW (ref 8.9–10.3)
Chloride: 99 mmol/L (ref 98–111)
Creatinine, Ser: 8.22 mg/dL — ABNORMAL HIGH (ref 0.61–1.24)
GFR, Estimated: 7 mL/min — ABNORMAL LOW (ref >60)
Glucose, Bld: 93 mg/dL (ref 70–99)
Potassium: 4.2 mmol/L (ref 3.5–5.1)
Sodium: 136 mmol/L (ref 135–145)

## 2023-05-08 NOTE — ED Provider Notes (Signed)
[place] EMERGENCY DEPARTMENT AT [facility] Provider Note   CSN: [id number] Arrival date & time: 05/08/23  0557     History  Chief Complaint  Patient presents with   Leg Swelling    Damon Williams is a 62 y.o. male.  Patient is a 62 year old male with history of end-stage renal disease on hemodialysis, cardiomyopathy, hypertension, anemia.  Patient presenting today for evaluation of leg swelling.  He reports attending his normal dialysis session on Saturday, but began with leg swelling shortly afterward.  He does report consuming more water than normal and believes this may be the cause.  He does describe some shortness of breath.  No fevers or chills.  No cough.  Patient was seen here last night with similar complaints, but declined laboratory studies.  He left with the understanding that he would go to his dialysis center in the morning.  He went there this morning, but was turned away, so presents here.  Patient's nephrologist is Dr. Wolfgang Phoenix and attends dialysis at [facility] in [place].  The history is provided by the patient.       Home Medications Prior to Admission medications   Medication Sig Start Date End Date Taking? Authorizing Provider  amLODipine (NORVASC) 10 MG tablet Take 1 tablet (10 mg total) by mouth daily. 02/07/23   Shon Hale, MD  carvedilol (COREG) 12.5 MG tablet Take 1 tablet (12.5 mg total) by mouth 2 (two) times daily with a meal. 02/07/23   Emokpae, Courage, MD  furosemide (LASIX) 40 MG tablet Take 1 tablet (40 mg total) by mouth See admin instructions. Take 40 mg 4 times a week on non-dialysis days PRN for swelling,SOB, weight gain 03/15/23   Sharlene Dory, NP  hydrALAZINE (APRESOLINE) 25 MG tablet Take 1 tablet (25 mg total) by mouth 3 (three) times daily. 02/07/23   Shon Hale, MD  isosorbide mononitrate (IMDUR) 30 MG 24 hr tablet Take 0.5 tablets (15 mg total) by mouth daily. 02/07/23   Shon Hale, MD  mupirocin ointment  (BACTROBAN) 2 % Apply 1 Application topically 2 (two) times daily. 03/12/23   Peter Garter, PA  traZODone (DESYREL) 100 MG tablet Take 100 mg by mouth at bedtime.    [provider]      Allergies    Patient has no known allergies.    Review of Systems   Review of Systems  All other systems reviewed and are negative.   Physical Exam Updated Vital Signs BP (!) 154/106 (BP Location: Right Arm)   Pulse (!) 110   Temp 98.3 F (36.8 C) (Oral)   Resp (!) 27   Ht 5\' 7"  (1.702 m)   Wt 61.2 kg   SpO2 92%   BMI 21.14 kg/m  Physical Exam Vitals and nursing note reviewed.  Constitutional:      General: He is not in acute distress.    Appearance: He is well-developed. He is not diaphoretic.  HENT:     Head: Normocephalic and atraumatic.  Cardiovascular:     Rate and Rhythm: Normal rate and regular rhythm.     Heart sounds: No murmur heard.    No friction rub.  Pulmonary:     Effort: Pulmonary effort is normal. No respiratory distress.     Breath sounds: Rales present. No wheezing.     Comments: There are slight rales in the bases bilaterally. Abdominal:     General: Bowel sounds are normal. There is no distension.     Palpations:  Abdomen is soft.     Tenderness: There is no abdominal tenderness.  Musculoskeletal:        General: Normal range of motion.     Cervical back: Normal range of motion and neck supple.     Right lower leg: Edema present.     Left lower leg: Edema present.     Comments: There is 2-3+ pitting edema both lower extremities.  Skin:    General: Skin is warm and dry.  Neurological:     Mental Status: He is alert and oriented to person, place, and time.     Coordination: Coordination normal.     ED Results / Procedures / Treatments   Labs (all labs ordered are listed, but only abnormal results are displayed) Labs Reviewed  BASIC METABOLIC PANEL  CBC WITH DIFFERENTIAL/PLATELET    EKG None  Radiology DG Chest 2 View  Result Date:  05/07/2023 CLINICAL DATA:  Short of breath, left-sided pain EXAM: CHEST - 2 VIEW COMPARISON:  03/13/2023 FINDINGS: Frontal and lateral views of the chest demonstrate an enlarged cardiac silhouette. There is increased pulmonary vascular congestion, with bibasilar airspace disease and small effusions consistent with congestive heart failure. No pneumothorax. No acute bony abnormality. IMPRESSION: 1. Findings consistent with congestive heart failure. Electronically Signed   By: Sharlet Salina M.D.   On: 05/07/2023 22:26    Procedures Procedures  {Document cardiac monitor, telemetry assessment procedure when appropriate:1}  Medications Ordered in ED Medications - No data to display  ED Course/ Medical Decision Making/ A&P   {   Click here for ABCD2, HEART and other calculatorsREFRESH Note before signing :1}                              Medical Decision Making Amount and/or Complexity of Data Reviewed Labs: ordered. Radiology: ordered.   ***  {Document critical care time when appropriate:1} {Document review of labs and clinical decision tools ie heart score, Chads2Vasc2 etc:1}  {Document your independent review of radiology images, and any outside records:1} {Document your discussion with family members, caretakers, and with consultants:1} {Document social determinants of health affecting pt's care:1} {Document your decision making why or why not admission, treatments were needed:1} Final Clinical Impression(s) / ED Diagnoses Final diagnoses:  None    Rx / DC Orders ED Discharge Orders     None

## 2023-05-08 NOTE — ED Provider Notes (Signed)
  Physical Exam  BP (!) 154/106 (BP Location: Right Arm)   Pulse (!) 110   Temp 98.3 F (36.8 C) (Oral)   Resp (!) 27   Ht 5\' 7"  (1.702 m)   Wt 61.2 kg   SpO2 92%   BMI 21.14 kg/m   Physical Exam  Procedures  Procedures  ED Course / MDM   Clinical Course as of 05/08/23 0744  Mon May 08, 2023  0715 Handoff from Dr Judd Lien, HDS TTS, no missed sessions, drank too much over the weekend, dyspnea with hypoxia, volume overloaded on exam. Labs okay. CXR okay. Would like to get HD [SG]  0734 Pt reports he is ready to go, does not want to stay any longer. He has HD on Tuesday, will f/u with this  [SG]    Clinical Course User Index [SG] Sloan Leiter, DO   Medical Decision Making Amount and/or Complexity of Data Reviewed Labs: ordered. Radiology: ordered.   Pt ready to go, will call tomorrow for HD, no resp distress, gait steady. Does not want to wait for me to speak with nephrology regarding getting HD today.   The patient has requested to leave the ED against medical advice. I believe this patient is of sound mind and medical decision making capacity to refuse medical care. The patient is responding and asking questions appropriately. The patient is oriented to person, place and time. The patient is not psychotic, delusional, suicidal, homicidal or hallucinating. The patient demonstrates a normal mental capacity to make decisions regarding their healthcare. The patient is clinically sober and does not appear to be under the influence of any illicit drugs at this time. The patient has been advised of the risks, in layman terms, of leaving AMA which include, but are not limited to death, coma, permanent disability, loss of current lifestyle, delay in diagnosis. Alternatives have been offered - the patient remains steadfast in their wish to leave. The patient has been advised that should they change their mind they are welcome to return to this hospital, or any other, at any time. The patient  understands that in no way does an AMA discharge mean that I do not want them to have the best medical care available. To this end, I have offered appropriate prescriptions, referrals, and discharge instructions.     Sloan Leiter, DO 05/08/23 [phone]

## 2023-05-08 NOTE — Discharge Instructions (Signed)
Please call your dialysis clinic to arrange for dialysis   It was a pleasure caring for you today in the emergency department.  Please return to the emergency department for any worsening or worrisome symptoms.

## 2023-05-08 NOTE — ED Triage Notes (Signed)
Patient from home for bilateral leg swelling and SOB with exertion x2 days. Patient was discharged from ER on Saturday and had dialysis treatment, attempted to go this morning but they did not have a spot for him. Patient is TTS dialysis, no sticks/ BP to L arm. Upon arrival to ER, patient is alert and oriented, ambu

## 2023-05-09 DIAGNOSIS — Z992 Dependence on renal dialysis: Secondary | ICD-10-CM | POA: Diagnosis not present

## 2023-05-09 DIAGNOSIS — N186 End stage renal disease: Secondary | ICD-10-CM | POA: Diagnosis not present

## 2023-05-11 DIAGNOSIS — N186 End stage renal disease: Secondary | ICD-10-CM | POA: Diagnosis not present

## 2023-05-11 DIAGNOSIS — Z992 Dependence on renal dialysis: Secondary | ICD-10-CM | POA: Diagnosis not present

## 2023-05-20 DIAGNOSIS — Z992 Dependence on renal dialysis: Secondary | ICD-10-CM | POA: Diagnosis not present

## 2023-05-20 DIAGNOSIS — N186 End stage renal disease: Secondary | ICD-10-CM | POA: Diagnosis not present

## 2023-05-23 DIAGNOSIS — Z992 Dependence on renal dialysis: Secondary | ICD-10-CM | POA: Diagnosis not present

## 2023-05-23 DIAGNOSIS — N186 End stage renal disease: Secondary | ICD-10-CM | POA: Diagnosis not present

## 2023-05-25 DIAGNOSIS — N186 End stage renal disease: Secondary | ICD-10-CM | POA: Diagnosis not present

## 2023-05-25 DIAGNOSIS — Z992 Dependence on renal dialysis: Secondary | ICD-10-CM | POA: Diagnosis not present

## 2023-05-26 DIAGNOSIS — N186 End stage renal disease: Secondary | ICD-10-CM | POA: Diagnosis not present

## 2023-05-26 DIAGNOSIS — E8770 Fluid overload, unspecified: Secondary | ICD-10-CM | POA: Diagnosis not present

## 2023-05-26 DIAGNOSIS — Z992 Dependence on renal dialysis: Secondary | ICD-10-CM | POA: Diagnosis not present

## 2023-05-27 DIAGNOSIS — Z992 Dependence on renal dialysis: Secondary | ICD-10-CM | POA: Diagnosis not present

## 2023-05-27 DIAGNOSIS — N186 End stage renal disease: Secondary | ICD-10-CM | POA: Diagnosis not present

## 2023-05-30 DIAGNOSIS — N186 End stage renal disease: Secondary | ICD-10-CM | POA: Diagnosis not present

## 2023-05-30 DIAGNOSIS — Z992 Dependence on renal dialysis: Secondary | ICD-10-CM | POA: Diagnosis not present

## 2023-06-01 DIAGNOSIS — N186 End stage renal disease: Secondary | ICD-10-CM | POA: Diagnosis not present

## 2023-06-01 DIAGNOSIS — Z992 Dependence on renal dialysis: Secondary | ICD-10-CM | POA: Diagnosis not present

## 2023-06-03 DIAGNOSIS — Z992 Dependence on renal dialysis: Secondary | ICD-10-CM | POA: Diagnosis not present

## 2023-06-03 DIAGNOSIS — N186 End stage renal disease: Secondary | ICD-10-CM | POA: Diagnosis not present

## 2023-06-06 DIAGNOSIS — N186 End stage renal disease: Secondary | ICD-10-CM | POA: Diagnosis not present

## 2023-06-06 DIAGNOSIS — Z992 Dependence on renal dialysis: Secondary | ICD-10-CM | POA: Diagnosis not present

## 2023-06-10 DIAGNOSIS — N186 End stage renal disease: Secondary | ICD-10-CM | POA: Diagnosis not present

## 2023-06-10 DIAGNOSIS — Z992 Dependence on renal dialysis: Secondary | ICD-10-CM | POA: Diagnosis not present

## 2023-06-13 DIAGNOSIS — Z992 Dependence on renal dialysis: Secondary | ICD-10-CM | POA: Diagnosis not present

## 2023-06-13 DIAGNOSIS — N186 End stage renal disease: Secondary | ICD-10-CM | POA: Diagnosis not present

## 2023-06-14 ENCOUNTER — Ambulatory Visit: Payer: 59 | Admitting: Nurse Practitioner

## 2023-06-14 NOTE — Progress Notes (Signed)
No show on 06/14/2023.   Sharlene Dory, NP

## 2023-06-15 DIAGNOSIS — N186 End stage renal disease: Secondary | ICD-10-CM | POA: Diagnosis not present

## 2023-06-15 DIAGNOSIS — Z992 Dependence on renal dialysis: Secondary | ICD-10-CM | POA: Diagnosis not present

## 2023-06-17 DIAGNOSIS — Z992 Dependence on renal dialysis: Secondary | ICD-10-CM | POA: Diagnosis not present

## 2023-06-17 DIAGNOSIS — N186 End stage renal disease: Secondary | ICD-10-CM | POA: Diagnosis not present

## 2023-06-20 DIAGNOSIS — N186 End stage renal disease: Secondary | ICD-10-CM | POA: Diagnosis not present

## 2023-06-20 DIAGNOSIS — Z992 Dependence on renal dialysis: Secondary | ICD-10-CM | POA: Diagnosis not present

## 2023-06-22 DIAGNOSIS — N186 End stage renal disease: Secondary | ICD-10-CM | POA: Diagnosis not present

## 2023-06-22 DIAGNOSIS — Z992 Dependence on renal dialysis: Secondary | ICD-10-CM | POA: Diagnosis not present

## 2023-06-24 DIAGNOSIS — Z992 Dependence on renal dialysis: Secondary | ICD-10-CM | POA: Diagnosis not present

## 2023-06-24 DIAGNOSIS — N186 End stage renal disease: Secondary | ICD-10-CM | POA: Diagnosis not present

## 2023-06-27 DIAGNOSIS — N186 End stage renal disease: Secondary | ICD-10-CM | POA: Diagnosis not present

## 2023-06-27 DIAGNOSIS — Z992 Dependence on renal dialysis: Secondary | ICD-10-CM | POA: Diagnosis not present

## 2023-06-29 DIAGNOSIS — Z992 Dependence on renal dialysis: Secondary | ICD-10-CM | POA: Diagnosis not present

## 2023-06-29 DIAGNOSIS — N186 End stage renal disease: Secondary | ICD-10-CM | POA: Diagnosis not present

## 2023-07-01 DIAGNOSIS — Z992 Dependence on renal dialysis: Secondary | ICD-10-CM | POA: Diagnosis not present

## 2023-07-01 DIAGNOSIS — N186 End stage renal disease: Secondary | ICD-10-CM | POA: Diagnosis not present

## 2023-07-06 DIAGNOSIS — N186 End stage renal disease: Secondary | ICD-10-CM | POA: Diagnosis not present

## 2023-07-06 DIAGNOSIS — Z992 Dependence on renal dialysis: Secondary | ICD-10-CM | POA: Diagnosis not present

## 2023-07-08 DIAGNOSIS — Z992 Dependence on renal dialysis: Secondary | ICD-10-CM | POA: Diagnosis not present

## 2023-07-08 DIAGNOSIS — N186 End stage renal disease: Secondary | ICD-10-CM | POA: Diagnosis not present

## 2023-07-11 ENCOUNTER — Ambulatory Visit: Payer: 59 | Admitting: Nurse Practitioner

## 2023-07-11 DIAGNOSIS — Z992 Dependence on renal dialysis: Secondary | ICD-10-CM | POA: Diagnosis not present

## 2023-07-11 DIAGNOSIS — N186 End stage renal disease: Secondary | ICD-10-CM | POA: Diagnosis not present

## 2023-07-11 NOTE — Progress Notes (Deleted)
 Cardiology Office Note:  .   Date:  03/15/2023 ID:  Damon Williams, DOB 1960-08-09, MRN [id number] PCP: Benjamin Raina Pearlie Nies, NP  [facility] Providers Cardiologist:  Alvan Carrier, MD    History of Present Illness: .   Damon Williams is a 62 y.o. male with a PMH of HTN, ESRD, hx of polysubstance abuse (current cocaine  user, current smoker, former ETOH abuse), pericardial effusion, MR, aortic dilatation, and cardiomyopathy, who is seen today for follow-up. History of recent frequent ED visits and hospitalizations noted below.  Admitted 10/2022 for uncontrolled HTN, AKI on CKD stage IV, found to have new onset cardiomyopathy with EF found to be 30-35%, grade 2 DD with small pericardial effusion. Evaluated by Cards, Dr. Alvan stated not to start ACE/ARB/ARNI/MRA/SGLT2i given renal dysfunction.   Readmitted 11/2022 for shortness of breath, found to be in volume overload in setting of AKI on CKD stage IV -> ESRD, was started on HD. Established dialysis with [facility] MWF schedule.   I last saw him for hospital follow-up on January 20, 2023. Was overall doing well. Reported he stopped cocaine , alcohol, and tobacco.  Was undergoing HD on T/Th/Sat.  Following Dr. Rachele. Denied any CP, SHOB, palpitations, syncope, presyncope, dizziness, orthopnea, PND, significant weight changes, acute bleeding, or claudication.  Admitted to leg edema, stable over time.  BP was mildly elevated as he had not taken his BP medications prior to office visit. BP at home typically well controlled.  Since I last saw patient, he has had multiple ED visits and 2 hospital admissions. He was admitted 01/27/2023 - 01/28/2023 for volume overload due to not completing serial dialysis treatments.  Also noted to have symptoms and findings of acute respiratory failure with hypoxia requiring 2 L of oxygen via nasal cannula. Pt also noted palpitations.  It was reported that patient had been reducing his hemodialysis sessions to 3  hours instead of 4 hours due to obligations at home to take care of his parents.  He noted peripheral edema for a couple of weeks with very minimal urine output that was normal for him.  He was discharged after he received hemodialysis in the hospital.  Hospital admission 02/05/2023 - 02/07/2023 for acute hypoxic respiratory failure due to volume overload status in the setting of not having his full dialysis session the previous Saturday.  During the hospital course, he required up to 5 L of oxygen via nasal cannula and hypoxia resolved after receiving hemodialysis.  ED visit 02/11/2023 for evaluation for shortness of breath.  It was noted that patient completed dialysis treatment, O2 sats at the facility were in the 80s and was advised to come to the hospital.  Patient denied feeling short of breath at the time.  O2 sats in the ED were in the lower to mid 90s.  Patient denied any chest pain, with no increased work of breathing on exam.  He ambulated in the department and O2 dropped temporarily into the upper 80s but quickly improved to low 90s.  No persistent hypoxia and patient requested to go home.  Was d/c to home.  ED visit the following day for shortness of breath.  O2 sats in 80s or low 90s on room air.  Chest x-ray revealed volume overload versus infiltrate.  ED physician personally ambulated patient in emergency department and appeared clinically well, noted patient did get somewhat winded with oxygen saturations dropping to 90% with exertion.  Dr. Geroldine reported on ED note from 02/12/2023  patient is adamant  about not being admitted.  He is requesting to be discharged so that he can go to his dialysis center and have them dialyze him. Patient was d/c upon his request.  More recently, he presented to the ED on March 12, 2023 for an insect bite. Was staying in [facility] [place] and reported seeing bugs all over his body.  Patient reported having scrapes all over lower extremities and along some of his upper  extremities.  Denied any fever or chills.  There is no clinical evidence of secondary infectious process.  Was treated with topical antibiotics and recommended antihistamines for pruritus and was recommended follow-up with outpatient primary care.  He soon returned back to the emergency department and noted generalized body aches on 03/12/2023. Did admit to smoking cocaine  that day. Left sided chest pain that patient endorsed was felt most likely to be r/t to his cocaine  use. CXR revealed cardiomegaly and vascular congestion. Elevated trop likely r/t recent cocaine  use and ESRD, was doubted to be ACS. Repeat troponin decreased. Encouraged to abstain from cocaine  and D/C to home.   Today he presents for follow-up. He has lost 13 lbs since I last saw him, has lost a total of 35 lbs unintentionally since April/May of this year. He is losing weight despite eating 3 meals per day. Goes to dialysis several days per week. When I asked him regarding the recent insects, he clarifies that he were not bed bugs but were white bugs. He draws a picture for me on a piece of paper, says there were white bugs. Says these bugs sucked the blood out of him, his description sounds difficult to determine what insect this was. Says he poured gasoline several times on him to get rid of them. Says he has not had any more issues with insects after now being out of the [place]. Denies any more cocaine  use. Only smokes about one cigarette per his report. Denies any alcohol use. Denies any chest pain, shortness of breath, palpitations, syncope, presyncope, dizziness, orthopnea, PND, worsening swelling or recent bleeding, or claudication. Requesting to see a new PCP.   Family History: Hypercholesterolemia: Mother and Father Stroke: Paternal Grandfather HTN: Brother and Sister  Studies Reviewed: .    Echo 10/2022: 1. Left ventricular ejection fraction, by estimation, is 30 to 35%. The  left ventricle has moderately decreased  function. The left ventricle  demonstrates global hypokinesis. The left ventricular internal cavity size  was mildly to moderately dilated.  There is mild concentric left ventricular hypertrophy. Left ventricular  diastolic parameters are consistent with Grade II diastolic dysfunction  (pseudonormalization).   2. Right ventricular systolic function is low normal. The right  ventricular size is normal. Tricuspid regurgitation signal is inadequate  for assessing PA pressure.   3. Left atrial size was moderately dilated.   4. Right atrial size was mildly dilated.   5. A small pericardial effusion is present. The pericardial effusion is  posterior to the left ventricle.   6. The mitral valve is grossly normal, mildly thickened. Mild mitral  valve regurgitation.   7. The aortic valve is tricuspid. Aortic valve regurgitation is not  visualized.   8. Aortic dilatation noted. There is moderate dilatation of the aortic  root, measuring 43 mm.   9. The inferior vena cava is dilated in size with >50% respiratory  variability, suggesting right atrial pressure of 8 mmHg.   Comparison(s): No prior Echocardiogram. Consider hypertensive versus  infiltrative cardiomyopathy.  Risk Assessment/Calculations:  The 10-year ASCVD risk score (Arnett DK, et al., 2019) is: 30.3%   Values used to calculate the score:     Age: 57 years     Sex: Male     Is Non-Hispanic African American: Yes     Diabetic: No     Tobacco smoker: Yes     Systolic Blood Pressure: 154 mmHg     Is BP treated: Yes     HDL Cholesterol: 31 mg/dL     Total Cholesterol: 130 mg/dL      Physical Exam:   VS:  There were no vitals taken for this visit.   Wt Readings from Last 3 Encounters:  05/08/23 135 lb (61.2 kg)  05/07/23 141 lb (64 kg)  03/15/23 141 lb 6.4 oz (64.1 kg)    GEN: Well nourished, well developed in no acute distress NECK: No JVD; No carotid bruits CARDIAC: S1/S2, RRR, no murmurs, rubs, gallops RESPIRATORY:   Clear to auscultation without rales, wheezing or rhonchi  ABDOMEN: Soft, non-tender, non-distended EXTREMITIES:  No edema; No deformity, graft along LUE is waiting to mature, multiple superficial abrasions to BLE with some surrounding excoriations. No surrounding erythema noted. No bugs seen on patient today.   ASSESSMENT AND PLAN: .    Hypertensive cardiomyopathy, NICM Diagnosed 10/2022. Stage C, NYHA class I symptoms. TTE revealed EF 30-35%, grade 2 DD, was recommended to consider hypertensive vs infiltrative CM. No FH of CHF in his family per his report. Overall euvolemic and well compensated on exam. GDMT limited d/t ESRD. Continue Coreg , hydralazine , and Imdur . Will reduce Lasix  to 40 mg daily PRN on non-dialysis days d/t weight loss. He will take if he notices SHOB, swelling, or weight gain.  Low sodium diet, fluid restriction <2L, and daily weights encouraged. Educated to contact our office for weight gain of 2 lbs overnight or 5 lbs in one week. No longer using cocaine  after recent relapse, I congratulated him. Previously case d/w Dr. Alvan who agreed that cardiac rehab referral would be fine. Discussed this with patient who is agreeable to this. Will place referral for him. Continue HD as scheduled. Care and ED precautions discussed.     HTN BP mildly elevated today. States BP well controlled at home. Discussed to monitor BP at home at least 2 hours after medications and sitting for 5-10 minutes. Goal SBP < 130. Continue medication regimen. Low salt, heart healthy diet encouraged.   3. Pericardial effusion Small pericardial effusion noted on TTE, pt denies any red flag symptoms. Will continue to monitor at this time. ED precautions discussed.   4. Mitral regurgitation Mild mitral regurgitation noted on TTE. Will continue to monitor over time with serial echos.   5. Aortic dilatation Moderate dilatation of aortic root at 43 mm. Will discuss updating Echo at future OV after cardiac  rehab. Care and ED precautions discussed. Heart healthy diet encouraged.   6. ESRD on HD Currently on a Tues/Thurs/Sat schedule for HD and getting treatments through port on right upper chest, current graft along left upper arm is waiting to mature, following VVS. No medication changes at this time. Continue to follow-up with Dr. Rachele.   7. Tobacco use, hx of drug use Congratulated him on stopping cocaine  and discussed risks of cocaine  use if he were to continue using it. Smoking cessation encouraged and discussed.   8. Insect bites See PE mentioned above. No longer living in [address] and says he no longer is encountering the same insects. Patient denies  any fever, chills, N/V/D and no signs of cellulitis or infection on exam. Recommended to follow-up with PCP. Patient requesting new PCP, will provide referral for him.   9. Unintentional weight loss Has lost around 35 lbs since April/May unintentionally. Etiology unknown. Reduced Lasix  as mentioned above. Referring patient to PCP for further evaluation.   During office visit, this NP used PPE during the encounter.  Dispo: Follow-up with me or APP in 3 months via telephone visit or sooner if anything changes.   Signed, Almarie Crate, NP

## 2023-07-13 ENCOUNTER — Emergency Department (HOSPITAL_COMMUNITY)
Admission: EM | Admit: 2023-07-13 | Discharge: 2023-07-13 | Disposition: A | Discharge status: Home / Self Care | Payer: 59 | Attending: Emergency Medicine | Admitting: Emergency Medicine

## 2023-07-13 ENCOUNTER — Encounter (HOSPITAL_COMMUNITY): Payer: Self-pay | Admitting: *Deleted

## 2023-07-13 ENCOUNTER — Other Ambulatory Visit: Payer: Self-pay

## 2023-07-13 ENCOUNTER — Emergency Department (HOSPITAL_COMMUNITY): Payer: 59

## 2023-07-13 DIAGNOSIS — Z720 Tobacco use: Secondary | ICD-10-CM | POA: Diagnosis not present

## 2023-07-13 DIAGNOSIS — M79641 Pain in right hand: Secondary | ICD-10-CM | POA: Diagnosis not present

## 2023-07-13 DIAGNOSIS — Z20822 Contact with and (suspected) exposure to covid-19: Secondary | ICD-10-CM | POA: Insufficient documentation

## 2023-07-13 DIAGNOSIS — R442 Other hallucinations: Secondary | ICD-10-CM | POA: Diagnosis not present

## 2023-07-13 DIAGNOSIS — Z992 Dependence on renal dialysis: Secondary | ICD-10-CM | POA: Insufficient documentation

## 2023-07-13 DIAGNOSIS — J111 Influenza due to unidentified influenza virus with other respiratory manifestations: Secondary | ICD-10-CM

## 2023-07-13 DIAGNOSIS — M25532 Pain in left wrist: Secondary | ICD-10-CM | POA: Diagnosis not present

## 2023-07-13 DIAGNOSIS — J101 Influenza due to other identified influenza virus with other respiratory manifestations: Secondary | ICD-10-CM | POA: Insufficient documentation

## 2023-07-13 DIAGNOSIS — Z743 Need for continuous supervision: Secondary | ICD-10-CM | POA: Diagnosis not present

## 2023-07-13 DIAGNOSIS — R9431 Abnormal electrocardiogram [ECG] [EKG]: Secondary | ICD-10-CM | POA: Diagnosis not present

## 2023-07-13 DIAGNOSIS — N186 End stage renal disease: Secondary | ICD-10-CM | POA: Diagnosis not present

## 2023-07-13 DIAGNOSIS — R443 Hallucinations, unspecified: Secondary | ICD-10-CM | POA: Diagnosis present

## 2023-07-13 DIAGNOSIS — R441 Visual hallucinations: Secondary | ICD-10-CM | POA: Diagnosis not present

## 2023-07-13 DIAGNOSIS — F29 Unspecified psychosis not due to a substance or known physiological condition: Secondary | ICD-10-CM | POA: Diagnosis not present

## 2023-07-13 DIAGNOSIS — R4182 Altered mental status, unspecified: Secondary | ICD-10-CM | POA: Diagnosis not present

## 2023-07-13 LAB — COMPREHENSIVE METABOLIC PANEL
ALT: 23 U/L (ref 0–44)
AST: 19 U/L (ref 15–41)
Albumin: 2.6 g/dL — ABNORMAL LOW (ref 3.5–5.0)
Alkaline Phosphatase: 61 U/L (ref 38–126)
Anion gap: 13 (ref 5–15)
BUN: 46 mg/dL — ABNORMAL HIGH (ref 6–20)
CO2: 25 mmol/L (ref 22–32)
Calcium: 7.4 mg/dL — ABNORMAL LOW (ref 8.9–10.3)
Chloride: 100 mmol/L (ref 98–111)
Creatinine, Ser: 10.5 mg/dL — ABNORMAL HIGH (ref 0.61–1.24)
GFR, Estimated: 5 mL/min — ABNORMAL LOW (ref >60)
Glucose, Bld: 150 mg/dL — ABNORMAL HIGH (ref 70–99)
Potassium: 3.1 mmol/L — ABNORMAL LOW (ref 3.5–5.1)
Sodium: 138 mmol/L (ref 135–145)
Total Bilirubin: 0.6 mg/dL (ref 0.0–1.2)
Total Protein: 6.5 g/dL (ref 6.5–8.1)

## 2023-07-13 LAB — CBC WITH DIFFERENTIAL/PLATELET
Abs Immature Granulocytes: 0.01 10*3/uL (ref 0.00–0.07)
Basophils Absolute: 0 10*3/uL (ref 0.0–0.1)
Basophils Relative: 1 %
Eosinophils Absolute: 0 10*3/uL (ref 0.0–0.5)
Eosinophils Relative: 1 %
HCT: 35.9 % — ABNORMAL LOW (ref 39.0–52.0)
Hemoglobin: 11.8 g/dL — ABNORMAL LOW (ref 13.0–17.0)
Immature Granulocytes: 0 %
Lymphocytes Relative: 20 %
Lymphs Abs: 0.8 10*3/uL (ref 0.7–4.0)
MCH: 30.2 pg (ref 26.0–34.0)
MCHC: 32.9 g/dL (ref 30.0–36.0)
MCV: 91.8 fL (ref 80.0–100.0)
Monocytes Absolute: 0.3 10*3/uL (ref 0.1–1.0)
Monocytes Relative: 8 %
Neutro Abs: 2.7 10*3/uL (ref 1.7–7.7)
Neutrophils Relative %: 70 %
Platelets: 199 10*3/uL (ref 150–400)
RBC: 3.91 MIL/uL — ABNORMAL LOW (ref 4.22–5.81)
RDW: 15.5 % (ref 11.5–15.5)
WBC: 3.8 10*3/uL — ABNORMAL LOW (ref 4.0–10.5)
nRBC: 0 % (ref 0.0–0.2)

## 2023-07-13 LAB — RESP PANEL BY RT-PCR (RSV, FLU A&B, COVID)  RVPGX2
Influenza A by PCR: POSITIVE — AB
Influenza B by PCR: NEGATIVE
Resp Syncytial Virus by PCR: NEGATIVE
SARS Coronavirus 2 by RT PCR: NEGATIVE

## 2023-07-13 LAB — ETHANOL: Alcohol, Ethyl (B): <10 mg/dL (ref <10)

## 2023-07-13 MED ORDER — HYDROXYZINE HCL 25 MG PO TABS
25.0000 mg | ORAL_TABLET | Freq: Three times a day (TID) | ORAL | 0 refills | Status: AC | PRN
Start: 2023-07-13 — End: 2023-07-18

## 2023-07-13 MED ORDER — HYDROXYZINE HCL 25 MG PO TABS
25.0000 mg | ORAL_TABLET | Freq: Once | ORAL | Status: AC
  Administered 2023-07-13: 25 mg via ORAL
  Filled 2023-07-13: qty 1

## 2023-07-13 NOTE — Discharge Instructions (Signed)
 You tested positive for the flu. You can take Tylenol  every 6-8 hours as needed for body aches or fevers. Take Atarax  as needed for itching.  Follow-up with your primary care provider in the next 5 to 7 days for reevaluation. Information about their office is provided above.  Get help right away if: You become short of breath or have trouble breathing. Your skin or nails turn blue. You have very bad pain or stiffness in your neck. You get a sudden headache or pain in your face or ear. You vomit each time you eat or drink.

## 2023-07-13 NOTE — ED Triage Notes (Addendum)
 Pt brought in by [facility] from dialysis center with c/o hallucinations. Per EMS, dialysis center called 911 today because pt was hallucinating saying bugs were crawling around inside his body and his nose. No bugs were seen by dialysis or EMS. Pt received majority of his dialysis treatment and doesn't need to come back for remainder of it per EMS. CBG 226 and SBP 146 for EMS. Pt does have spots on his skin (arms and legs) where he was has been picking/scratching at.

## 2023-07-13 NOTE — ED Provider Notes (Signed)
 [place] EMERGENCY DEPARTMENT AT [facility] Provider Note   CSN: [id number] Arrival date & time: 07/13/23  0920     History  Chief Complaint  Patient presents with   Hallucinations    Damon Williams is a 63 y.o. male with a history of ESRD, cardiomyopathy, and tobacco abuse who presents the ED today via EMS for hallucinations.  Patient was at dialysis this morning when they called 911 did patient seen at there is bugs crawling on him and inside of him, although not were appreciated by dialysis staff, EMS personnel, or at the time of evaluation in the ED. Patient tells me that this started yesterday.  He states that he is staying at a motel and thinks that is where he got them from.  He also endorses smoking marijuana yesterday with a friend and does not know if it was laced with anything else.  He denies any recent alcohol use.  Denies SI, HI, or auditory hallucinations.     Home Medications Prior to Admission medications   Medication Sig Start Date End Date Taking? Authorizing Provider  hydrOXYzine  (ATARAX ) 25 MG tablet Take 1 tablet (25 mg total) by mouth every 8 (eight) hours as needed for up to 5 days for itching. 07/13/23 07/18/23 Yes Waddell Sluder, PA-C  amLODipine  (NORVASC ) 10 MG tablet Take 1 tablet (10 mg total) by mouth daily. 02/07/23   Pearlean Manus, MD  carvedilol  (COREG ) 12.5 MG tablet Take 1 tablet (12.5 mg total) by mouth 2 (two) times daily with a meal. 02/07/23   Emokpae, Courage, MD  furosemide  (LASIX ) 40 MG tablet Take 1 tablet (40 mg total) by mouth See admin instructions. Take 40 mg 4 times a week on non-dialysis days PRN for swelling,SOB, weight gain 03/15/23   Miriam Norris, NP  hydrALAZINE  (APRESOLINE ) 25 MG tablet Take 1 tablet (25 mg total) by mouth 3 (three) times daily. 02/07/23   Pearlean Manus, MD  isosorbide  mononitrate (IMDUR ) 30 MG 24 hr tablet Take 0.5 tablets (15 mg total) by mouth daily. 02/07/23   Pearlean Manus, MD  mupirocin  ointment  (BACTROBAN ) 2 % Apply 1 Application topically 2 (two) times daily. 03/12/23   Silver Wonda LABOR, PA  traZODone  (DESYREL ) 100 MG tablet Take 100 mg by mouth at bedtime.    [provider]      Allergies    Patient has no known allergies.    Review of Systems   Review of Systems  Psychiatric/Behavioral:  Positive for hallucinations.   All other systems reviewed and are negative.   Physical Exam Updated Vital Signs BP (!) 132/98 (BP Location: Left Arm)   Pulse 97   Temp 98.5 F (36.9 C) (Oral)   Resp 20   Ht 5' 7 (1.702 m)   Wt 61.2 kg   SpO2 97%   BMI 21.13 kg/m  Physical Exam Vitals and nursing note reviewed.  Constitutional:      Appearance: Normal appearance.  HENT:     Head: Normocephalic and atraumatic.     Mouth/Throat:     Mouth: Mucous membranes are moist.  Eyes:     Conjunctiva/sclera: Conjunctivae normal.     Pupils: Pupils are equal, round, and reactive to light.  Cardiovascular:     Rate and Rhythm: Normal rate and regular rhythm.     Pulses: Normal pulses.     Heart sounds: Normal heart sounds.  Pulmonary:     Effort: Pulmonary effort is normal.     Breath sounds:  Normal breath sounds.  Abdominal:     Palpations: Abdomen is soft.     Tenderness: There is no abdominal tenderness.  Musculoskeletal:        General: Normal range of motion.  Skin:    General: Skin is warm and dry.     Findings: No rash.  Neurological:     General: No focal deficit present.     Mental Status: He is alert.  Psychiatric:        Mood and Affect: Mood normal.        Behavior: Behavior normal.    ED Results / Procedures / Treatments   Labs (all labs ordered are listed, but only abnormal results are displayed) Labs Reviewed  RESP PANEL BY RT-PCR (RSV, FLU A&B, COVID)  RVPGX2 - Abnormal; Notable for the following components:      Result Value   Influenza A by PCR POSITIVE (*)    All other components within normal limits  COMPREHENSIVE METABOLIC PANEL -  Abnormal; Notable for the following components:   Potassium 3.1 (*)    Glucose, Bld 150 (*)    BUN 46 (*)    Creatinine, Ser 10.50 (*)    Calcium  7.4 (*)    Albumin 2.6 (*)    GFR, Estimated 5 (*)    All other components within normal limits  CBC WITH DIFFERENTIAL/PLATELET - Abnormal; Notable for the following components:   WBC 3.8 (*)    RBC 3.91 (*)    Hemoglobin 11.8 (*)    HCT 35.9 (*)    All other components within normal limits  ETHANOL    EKG EKG Interpretation Date/Time:  Thursday July 13 2023 10:20:22 EST Ventricular Rate:  97 PR Interval:  158 QRS Duration:  92 QT Interval:  390 QTC Calculation: 495 R Axis:   17  Text Interpretation: Normal sinus rhythm Possible Left atrial enlargement Left ventricular hypertrophy with repolarization abnormality ( Sokolow-Lyon , Cornell product ) Cannot rule out Septal infarct , age undetermined Abnormal ECG When compared with ECG of 07-May-2023 21:24, ST no longer depressed in Lateral leads T wave inversion less evident in Lateral leads Confirmed by Towana Sharper [phone]) on 07/13/2023 10:21:10 AM  Radiology DG Wrist Complete Left Result Date: 07/13/2023 CLINICAL DATA:  Hallucinations.  Wrist pain. EXAM: LEFT WRIST - COMPLETE 4 VIEW COMPARISON:  Left wrist radiograph dated 01/01/2018 FINDINGS: There is no evidence of fracture or dislocation. Lobulated ossification along the ulnar aspect of the distal ulna, at the site of prior fracture fragments. Soft tissues are unremarkable. IMPRESSION: No acute fracture or dislocation. Electronically Signed   By: Limin  Xu M.D.   On: 07/13/2023 14:51   DG Hand Complete Right Result Date: 07/13/2023 CLINICAL DATA:  Hallucinations.  Hand pain. EXAM: RIGHT HAND - COMPLETE 3 VIEW COMPARISON:  None Available. FINDINGS: There is no evidence of fracture or dislocation. Old fracture deformity of fourth metacarpal shaft. Soft tissues are unremarkable. IMPRESSION: 1. No acute fracture or dislocation. 2. Old  fracture deformity of fourth metacarpal shaft. Electronically Signed   By: Limin  Xu M.D.   On: 07/13/2023 14:48    Procedures Procedures: not indicated.   Medications Ordered in ED Medications  hydrOXYzine  (ATARAX ) tablet 25 mg (25 mg Oral Given 07/13/23 1018)    ED Course/ Medical Decision Making/ A&P  Medical Decision Making Amount and/or Complexity of Data Reviewed Labs: ordered. Radiology: ordered.  Risk Prescription drug management.   This patient presents to the ED for concern of hallucinations, this involves an extensive number of treatment options, and is a complaint that carries with it a high risk of complications and morbidity.   Differential diagnosis includes: acute alcohol intoxication, acute substance use,    Comorbidities  See HPI above   Additional History  Additional history obtained from prior records.   Cardiac Monitoring / EKG  The patient was maintained on a cardiac monitor.  I personally viewed and interpreted the cardiac monitored which showed: NSR with left atrial enlargement with a heart rate of 97 bpm.   Lab Tests  I ordered and personally interpreted labs.  The pertinent results include:    CMP and CBC are within normal limits for patient Ethanol <10 Respiratory panel positive for flu A UDS and UA unable to be obtained - patient cannot produce urine second to ESRD.   Imaging  I ordered and personally interpreted imaging.   Right hand x-ray shows no acute fracture or dislocation. Left wrist x-ray shows no acute fracture or dislocation.   Problem List / ED Course / Critical Interventions / Medication Management   I ordered medications including: Atarax  for itching associated with hallucinations  Reevaluation of the patient after these medicines showed that the patient improved. At this time, patient reports he can't move or touch his let wrist as well as his right middle, ring, and pinky fingers  second to pain. There is no visible erythema, swelling, or deformity to either the fingers or wrist. No warmth to touch either. Patient states that this has been going on for a long time despite not complaining about them on initial evaluation. I ordered imaging, which ultimately came back normal. I have reviewed the patients home medicines and have made adjustments as needed.   Social Determinants of Health  Tobacco use   Test / Admission - Considered  Discussed findings with patient.  All questions were answered. He is hemodynamically stable and safe for discharge home. Return precautions provided.       Final Clinical Impression(s) / ED Diagnoses Final diagnoses:  Flu  Visual hallucinations    Rx / DC Orders ED Discharge Orders          Ordered    hydrOXYzine  (ATARAX ) 25 MG tablet  Every 8 hours PRN        07/13/23 1458              Waddell Sluder, PA-C 07/13/23 1506    Towana Ozell BROCKS, MD 07/13/23 2110

## 2023-07-13 NOTE — ED Notes (Signed)
 Pt unable to make urine at this time. States he hasn't in a couple days

## 2023-07-13 NOTE — ED Notes (Signed)
 Pt states the worms must be out now because he "doesn't not feel them presently."

## 2023-07-20 DIAGNOSIS — N186 End stage renal disease: Secondary | ICD-10-CM | POA: Diagnosis not present

## 2023-07-20 DIAGNOSIS — Z992 Dependence on renal dialysis: Secondary | ICD-10-CM | POA: Diagnosis not present

## 2023-07-25 ENCOUNTER — Other Ambulatory Visit: Payer: Self-pay

## 2023-07-25 ENCOUNTER — Encounter (HOSPITAL_COMMUNITY): Payer: Self-pay

## 2023-07-25 ENCOUNTER — Inpatient Hospital Stay (HOSPITAL_COMMUNITY): Payer: 59

## 2023-07-25 ENCOUNTER — Inpatient Hospital Stay (HOSPITAL_COMMUNITY)
Admission: EM | Admit: 2023-07-25 | Discharge: 2023-07-27 | DRG: 640 | Disposition: A | Discharge status: Home / Self Care | Payer: 59 | Attending: Family Medicine | Admitting: Family Medicine

## 2023-07-25 DIAGNOSIS — E872 Acidosis, unspecified: Secondary | ICD-10-CM | POA: Diagnosis present

## 2023-07-25 DIAGNOSIS — N186 End stage renal disease: Secondary | ICD-10-CM

## 2023-07-25 DIAGNOSIS — I517 Cardiomegaly: Secondary | ICD-10-CM | POA: Diagnosis not present

## 2023-07-25 DIAGNOSIS — I120 Hypertensive chronic kidney disease with stage 5 chronic kidney disease or end stage renal disease: Secondary | ICD-10-CM | POA: Diagnosis not present

## 2023-07-25 DIAGNOSIS — E875 Hyperkalemia: Principal | ICD-10-CM

## 2023-07-25 DIAGNOSIS — Z91158 Patient's noncompliance with renal dialysis for other reason: Secondary | ICD-10-CM

## 2023-07-25 DIAGNOSIS — Z91148 Patient's other noncompliance with medication regimen for other reason: Secondary | ICD-10-CM | POA: Diagnosis not present

## 2023-07-25 DIAGNOSIS — R7989 Other specified abnormal findings of blood chemistry: Secondary | ICD-10-CM

## 2023-07-25 DIAGNOSIS — F17210 Nicotine dependence, cigarettes, uncomplicated: Secondary | ICD-10-CM | POA: Diagnosis present

## 2023-07-25 DIAGNOSIS — K828 Other specified diseases of gallbladder: Secondary | ICD-10-CM | POA: Diagnosis not present

## 2023-07-25 DIAGNOSIS — R188 Other ascites: Secondary | ICD-10-CM | POA: Diagnosis not present

## 2023-07-25 DIAGNOSIS — J90 Pleural effusion, not elsewhere classified: Secondary | ICD-10-CM | POA: Diagnosis not present

## 2023-07-25 DIAGNOSIS — E8779 Other fluid overload: Secondary | ICD-10-CM | POA: Diagnosis not present

## 2023-07-25 DIAGNOSIS — K6289 Other specified diseases of anus and rectum: Secondary | ICD-10-CM | POA: Diagnosis present

## 2023-07-25 DIAGNOSIS — I132 Hypertensive heart and chronic kidney disease with heart failure and with stage 5 chronic kidney disease, or end stage renal disease: Secondary | ICD-10-CM | POA: Diagnosis present

## 2023-07-25 DIAGNOSIS — F1990 Other psychoactive substance use, unspecified, uncomplicated: Secondary | ICD-10-CM | POA: Diagnosis not present

## 2023-07-25 DIAGNOSIS — Z992 Dependence on renal dialysis: Secondary | ICD-10-CM | POA: Diagnosis not present

## 2023-07-25 DIAGNOSIS — Z79899 Other long term (current) drug therapy: Secondary | ICD-10-CM

## 2023-07-25 DIAGNOSIS — R7401 Elevation of levels of liver transaminase levels: Secondary | ICD-10-CM | POA: Diagnosis not present

## 2023-07-25 DIAGNOSIS — I429 Cardiomyopathy, unspecified: Secondary | ICD-10-CM | POA: Diagnosis present

## 2023-07-25 DIAGNOSIS — I5023 Acute on chronic systolic (congestive) heart failure: Secondary | ICD-10-CM | POA: Diagnosis not present

## 2023-07-25 DIAGNOSIS — N2581 Secondary hyperparathyroidism of renal origin: Secondary | ICD-10-CM | POA: Diagnosis present

## 2023-07-25 DIAGNOSIS — R0902 Hypoxemia: Secondary | ICD-10-CM | POA: Diagnosis not present

## 2023-07-25 DIAGNOSIS — M898X9 Other specified disorders of bone, unspecified site: Secondary | ICD-10-CM | POA: Diagnosis not present

## 2023-07-25 DIAGNOSIS — N250 Renal osteodystrophy: Secondary | ICD-10-CM | POA: Diagnosis not present

## 2023-07-25 DIAGNOSIS — R918 Other nonspecific abnormal finding of lung field: Secondary | ICD-10-CM | POA: Diagnosis not present

## 2023-07-25 DIAGNOSIS — D631 Anemia in chronic kidney disease: Secondary | ICD-10-CM | POA: Diagnosis present

## 2023-07-25 DIAGNOSIS — I5020 Unspecified systolic (congestive) heart failure: Secondary | ICD-10-CM | POA: Diagnosis not present

## 2023-07-25 LAB — CBC WITH DIFFERENTIAL/PLATELET
Abs Immature Granulocytes: 0.03 10*3/uL (ref 0.00–0.07)
Basophils Absolute: 0 10*3/uL (ref 0.0–0.1)
Basophils Relative: 0 %
Eosinophils Absolute: 0 10*3/uL (ref 0.0–0.5)
Eosinophils Relative: 0 %
HCT: 35.9 % — ABNORMAL LOW (ref 39.0–52.0)
Hemoglobin: 11.5 g/dL — ABNORMAL LOW (ref 13.0–17.0)
Immature Granulocytes: 1 %
Lymphocytes Relative: 7 %
Lymphs Abs: 0.4 10*3/uL — ABNORMAL LOW (ref 0.7–4.0)
MCH: 29.6 pg (ref 26.0–34.0)
MCHC: 32 g/dL (ref 30.0–36.0)
MCV: 92.3 fL (ref 80.0–100.0)
Monocytes Absolute: 0.8 10*3/uL (ref 0.1–1.0)
Monocytes Relative: 13 %
Neutro Abs: 4.8 10*3/uL (ref 1.7–7.7)
Neutrophils Relative %: 79 %
Platelets: 106 10*3/uL — ABNORMAL LOW (ref 150–400)
RBC: 3.89 MIL/uL — ABNORMAL LOW (ref 4.22–5.81)
RDW: 17.3 % — ABNORMAL HIGH (ref 11.5–15.5)
WBC: 6.1 10*3/uL (ref 4.0–10.5)
nRBC: 1.3 % — ABNORMAL HIGH (ref 0.0–0.2)

## 2023-07-25 LAB — BLOOD GAS, VENOUS
Acid-base deficit: 12.3 mmol/L — ABNORMAL HIGH (ref 0.0–2.0)
Bicarbonate: 14.2 mmol/L — ABNORMAL LOW (ref 20.0–28.0)
O2 Saturation: 40.8 %
Patient temperature: 36.8
pCO2, Ven: 34 mm[Hg] — ABNORMAL LOW (ref 44–60)
pH, Ven: 7.23 — ABNORMAL LOW (ref 7.25–7.43)
pO2, Ven: 33 mm[Hg] (ref 32–45)

## 2023-07-25 LAB — BASIC METABOLIC PANEL
Anion gap: 27 — ABNORMAL HIGH (ref 5–15)
BUN: 130 mg/dL — ABNORMAL HIGH (ref 6–20)
CO2: 12 mmol/L — ABNORMAL LOW (ref 22–32)
Calcium: 5.8 mg/dL — CL (ref 8.9–10.3)
Chloride: 98 mmol/L (ref 98–111)
Creatinine, Ser: 17.15 mg/dL — ABNORMAL HIGH (ref 0.61–1.24)
GFR, Estimated: 3 mL/min — ABNORMAL LOW (ref >60)
Glucose, Bld: 144 mg/dL — ABNORMAL HIGH (ref 70–99)
Potassium: 6.2 mmol/L — ABNORMAL HIGH (ref 3.5–5.1)
Sodium: 137 mmol/L (ref 135–145)

## 2023-07-25 LAB — HEPATIC FUNCTION PANEL
ALT: 731 U/L — ABNORMAL HIGH (ref 0–44)
AST: 286 U/L — ABNORMAL HIGH (ref 15–41)
Albumin: 2.8 g/dL — ABNORMAL LOW (ref 3.5–5.0)
Alkaline Phosphatase: 145 U/L — ABNORMAL HIGH (ref 38–126)
Bilirubin, Direct: 1 mg/dL — ABNORMAL HIGH (ref 0.0–0.2)
Indirect Bilirubin: 1 mg/dL — ABNORMAL HIGH (ref 0.3–0.9)
Total Bilirubin: 2 mg/dL — ABNORMAL HIGH (ref 0.0–1.2)
Total Protein: 6.6 g/dL (ref 6.5–8.1)

## 2023-07-25 MED ORDER — CALCIUM GLUCONATE-NACL 1-0.675 GM/50ML-% IV SOLN
1.0000 g | INTRAVENOUS | Status: AC
  Administered 2023-07-25 – 2023-07-26 (×4): 1000 mg via INTRAVENOUS
  Filled 2023-07-25 (×4): qty 50

## 2023-07-25 MED ORDER — ALBUTEROL SULFATE (2.5 MG/3ML) 0.083% IN NEBU
10.0000 mg | INHALATION_SOLUTION | Freq: Once | RESPIRATORY_TRACT | Status: AC
Start: 2023-07-25 — End: 2023-07-25
  Administered 2023-07-25: 10 mg via RESPIRATORY_TRACT
  Filled 2023-07-25: qty 12

## 2023-07-25 MED ORDER — ONDANSETRON HCL 4 MG/2ML IJ SOLN
4.0000 mg | Freq: Four times a day (QID) | INTRAMUSCULAR | Status: DC | PRN
Start: 2023-07-25 — End: 2023-07-27

## 2023-07-25 MED ORDER — ACETAMINOPHEN 650 MG RE SUPP: 650.0000 mg | Freq: Four times a day (QID) | RECTAL | Status: DC | PRN

## 2023-07-25 MED ORDER — NICOTINE 14 MG/24HR TD PT24: 14.0000 mg | MEDICATED_PATCH | Freq: Every day | TRANSDERMAL | Status: DC | PRN

## 2023-07-25 MED ORDER — BISACODYL 5 MG PO TBEC: 5.0000 mg | DELAYED_RELEASE_TABLET | Freq: Every day | ORAL | Status: DC | PRN

## 2023-07-25 MED ORDER — SODIUM ZIRCONIUM CYCLOSILICATE 10 G PO PACK
10.0000 g | PACK | Freq: Three times a day (TID) | ORAL | Status: DC
  Administered 2023-07-25: 10 g via ORAL
  Filled 2023-07-25: qty 2

## 2023-07-25 MED ORDER — CHLORHEXIDINE GLUCONATE CLOTH 2 % EX PADS
6.0000 | MEDICATED_PAD | Freq: Every day | CUTANEOUS | Status: DC
  Administered 2023-07-25 – 2023-07-27 (×2): 6 via TOPICAL

## 2023-07-25 MED ORDER — INSULIN ASPART 100 UNIT/ML IV SOLN
5.0000 [IU] | Freq: Once | INTRAVENOUS | Status: AC
  Administered 2023-07-25: 5 [IU] via INTRAVENOUS

## 2023-07-25 MED ORDER — ACETAMINOPHEN 325 MG PO TABS
650.0000 mg | ORAL_TABLET | Freq: Four times a day (QID) | ORAL | Status: DC | PRN
  Administered 2023-07-26: 650 mg via ORAL
  Filled 2023-07-25: qty 2

## 2023-07-25 MED ORDER — ISOSORBIDE MONONITRATE ER 30 MG PO TB24
15.0000 mg | ORAL_TABLET | Freq: Every day | ORAL | Status: DC
  Administered 2023-07-25 – 2023-07-26 (×2): 15 mg via ORAL
  Filled 2023-07-25 (×3): qty 1

## 2023-07-25 MED ORDER — SODIUM ZIRCONIUM CYCLOSILICATE 5 G PO PACK
10.0000 g | PACK | Freq: Once | ORAL | Status: AC
  Administered 2023-07-25: 10 g via ORAL
  Filled 2023-07-25: qty 2

## 2023-07-25 MED ORDER — POLYETHYLENE GLYCOL 3350 17 G PO PACK
17.0000 g | PACK | Freq: Every day | ORAL | Status: DC
  Filled 2023-07-25: qty 1

## 2023-07-25 MED ORDER — HEPARIN SODIUM (PORCINE) 5000 UNIT/ML IJ SOLN
5000.0000 [IU] | Freq: Three times a day (TID) | INTRAMUSCULAR | Status: DC
  Administered 2023-07-26 – 2023-07-27 (×4): 5000 [IU] via SUBCUTANEOUS
  Filled 2023-07-25 (×4): qty 1

## 2023-07-25 MED ORDER — SODIUM BICARBONATE 650 MG PO TABS
1300.0000 mg | ORAL_TABLET | Freq: Three times a day (TID) | ORAL | Status: DC
  Administered 2023-07-25 – 2023-07-27 (×4): 1300 mg via ORAL
  Filled 2023-07-25 (×4): qty 2

## 2023-07-25 MED ORDER — AMLODIPINE BESYLATE 5 MG PO TABS
5.0000 mg | ORAL_TABLET | Freq: Every day | ORAL | Status: DC
  Administered 2023-07-25 – 2023-07-26 (×2): 5 mg via ORAL
  Filled 2023-07-25 (×3): qty 1

## 2023-07-25 MED ORDER — CALCIUM GLUCONATE-NACL 2-0.675 GM/100ML-% IV SOLN
4.0000 g | Freq: Once | INTRAVENOUS | Status: DC
  Filled 2023-07-25: qty 200

## 2023-07-25 MED ORDER — ONDANSETRON HCL 4 MG PO TABS: 4.0000 mg | ORAL_TABLET | Freq: Four times a day (QID) | ORAL | Status: DC | PRN

## 2023-07-25 MED ORDER — CALCIUM GLUCONATE 10 % IV SOLN
1.0000 g | Freq: Once | INTRAVENOUS | Status: AC
  Administered 2023-07-25: 1 g via INTRAVENOUS
  Filled 2023-07-25: qty 10

## 2023-07-25 MED ORDER — DEXTROSE 50 % IV SOLN
1.0000 | Freq: Once | INTRAVENOUS | Status: AC
  Administered 2023-07-25: 50 mL via INTRAVENOUS
  Filled 2023-07-25: qty 50

## 2023-07-25 MED ORDER — SODIUM BICARBONATE 8.4 % IV SOLN
50.0000 meq | Freq: Once | INTRAVENOUS | Status: AC
  Administered 2023-07-25: 50 meq via INTRAVENOUS
  Filled 2023-07-25: qty 50

## 2023-07-25 NOTE — ED Triage Notes (Signed)
 Pt arrived via POV with multiple complaints. RUE restriction. Pt reports he has missed the last 2 dialysis appointments. Pt reports his rectum is in pain after Pt reports he wiped his buttocks with rubbing alcohol. Pt reports multiple bug bites to his hands, and they are hurting. Pt reports he has been staying at a friends place. Pt reports he has not used drugs since the last time he was seen here in [facility].

## 2023-07-25 NOTE — ED Notes (Signed)
 ..ED TO INPATIENT HANDOFF REPORT  ED Nurse Name and Phone #: [phone]  S Name/Age/Gender Damon Williams 63 y.o. male Room/Bed: APA09/APA09  Code Status   Code Status: Full Code  Home/SNF/Other Home Patient oriented to: self, place, time, and situation Is this baseline? Yes   Triage Complete: Triage complete  Chief Complaint ESRD (end stage renal disease) (HCC) [N18.6]  Triage Note Pt arrived via POV with multiple complaints. RUE restriction. Pt reports he has missed the last 2 dialysis appointments. Pt reports his rectum is in pain after Pt reports he wiped his buttocks with rubbing alcohol. Pt reports multiple bug bites to his hands, and they are hurting. Pt reports he has been staying at a friends place. Pt reports he has not used drugs since the last time he was seen here in [facility].   Allergies No Known Allergies  Level of Care/Admitting Diagnosis ED Disposition     ED Disposition  Admit   Condition  --   Comment  Hospital Area: [facility] [[id number]]  Level of Care: Med-Surg [16]  Covid Evaluation: Asymptomatic - no recent exposure (last 10 days) testing not required  Diagnosis: ESRD (end stage renal disease) [facility]) [[id number]]  Admitting Physician: LENON MARIEN CROME [[id number]]  Attending Physician: LENON MARIEN CROME [[id number]]  Certification:: I certify this patient will need inpatient services for at least 2 midnights  Expected Medical Readiness: 07/27/2023          B Medical/Surgery History Past Medical History:  Diagnosis Date   Cardiomyopathy (HCC)    CKD    Cocaine  use    ESRD (end stage renal disease) on dialysis (HCC) 12/05/2022   Hypertension    Tobacco abuse    Past Surgical History:  Procedure Laterality Date   AV FISTULA PLACEMENT Left 12/27/2022   Procedure: INSERTION OF LEFT ARM  ARTERIOVENOUS FISTULA;  Surgeon: Oris Krystal FALCON, MD;  Location: [facility];  Service: Vascular;  Laterality: Left;   BACK SURGERY     IR FLUORO GUIDE CV LINE  RIGHT  12/07/2022   IR US  GUIDE VASC ACCESS RIGHT  12/07/2022     A IV Location/Drains/Wounds Patient Lines/Drains/Airways Status     Active Line/Drains/Airways     Name Placement date Placement time Site Days   Peripheral IV 07/25/23 20 G 1 Anterior;Right Forearm 07/25/23  1930  Forearm  less than 1   Fistula / Graft Left Upper arm Arteriovenous fistula 12/27/22  0835  Upper arm  210   Hemodialysis Catheter Right Internal jugular Double lumen Permanent (Tunneled) 12/07/22  0943  Internal jugular  230            Intake/Output Last 24 hours No intake or output data in the 24 hours ending 07/25/23 2045  Labs/Imaging Results for orders placed or performed during the hospital encounter of 07/25/23 (from the past 48 hours)  CBC with Differential     Status: Abnormal   Collection Time: 07/25/23  4:28 PM  Result Value Ref Range   WBC 6.1 4.0 - 10.5 K/uL   RBC 3.89 (L) 4.22 - 5.81 MIL/uL   Hemoglobin 11.5 (L) 13.0 - 17.0 g/dL   HCT 64.0 (L) 60.9 - 47.9 %   MCV 92.3 80.0 - 100.0 fL   MCH 29.6 26.0 - 34.0 pg   MCHC 32.0 30.0 - 36.0 g/dL   RDW 82.6 (H) 88.4 - 84.4 %   Platelets 106 (L) 150 - 400 K/uL   nRBC 1.3 (H) 0.0 - 0.2 %  Neutrophils Relative % 79 %   Neutro Abs 4.8 1.7 - 7.7 K/uL   Lymphocytes Relative 7 %   Lymphs Abs 0.4 (L) 0.7 - 4.0 K/uL   Monocytes Relative 13 %   Monocytes Absolute 0.8 0.1 - 1.0 K/uL   Eosinophils Relative 0 %   Eosinophils Absolute 0.0 0.0 - 0.5 K/uL   Basophils Relative 0 %   Basophils Absolute 0.0 0.0 - 0.1 K/uL   Immature Granulocytes 1 %   Abs Immature Granulocytes 0.03 0.00 - 0.07 K/uL    Comment: Performed at [facility], [address] [place], [place] [address]  Basic metabolic panel     Status: Abnormal   Collection Time: 07/25/23  4:28 PM  Result Value Ref Range   Sodium 137 135 - 145 mmol/L   Potassium 6.2 (H) 3.5 - 5.1 mmol/L   Chloride 98 98 - 111 mmol/L   CO2 12 (L) 22 - 32 mmol/L   Glucose, Bld 144 (H) 70 - 99 mg/dL     Comment: Glucose reference range applies only to samples taken after fasting for at least 8 hours.   BUN 130 (H) 6 - 20 mg/dL    Comment: RESULTS CONFIRMED BY MANUAL DILUTION   Creatinine, Ser 17.15 (H) 0.61 - 1.24 mg/dL   Calcium  5.8 (LL) 8.9 - 10.3 mg/dL    Comment: CRITICAL RESULT CALLED TO, READ BACK BY AND VERIFIED WITH WHITE,M ON 07/25/23 AT 1850 BY LOY,C   GFR, Estimated 3 (L) >60 mL/min    Comment: (NOTE) Calculated using the CKD-EPI Creatinine Equation (2021)    Anion gap 27 (H) 5 - 15    Comment: Electrolytes repeated to confirm. Electrolytes repeated to confirm. Performed at [facility], [address] [place], [place] [address]   Hepatic function panel     Status: Abnormal   Collection Time: 07/25/23  4:28 PM  Result Value Ref Range   Total Protein 6.6 6.5 - 8.1 g/dL   Albumin 2.8 (L) 3.5 - 5.0 g/dL   AST 713 (H) 15 - 41 U/L   ALT 731 (H) 0 - 44 U/L   Alkaline Phosphatase 145 (H) 38 - 126 U/L   Total Bilirubin 2.0 (H) 0.0 - 1.2 mg/dL   Bilirubin, Direct 1.0 (H) 0.0 - 0.2 mg/dL   Indirect Bilirubin 1.0 (H) 0.3 - 0.9 mg/dL    Comment: Performed at [facility], [address], [place], [place] [address]  Blood gas, venous     Status: Abnormal   Collection Time: 07/25/23  7:27 PM  Result Value Ref Range   pH, Ven 7.23 (L) 7.25 - 7.43   pCO2, Ven 34 (L) 44 - 60 mmHg   pO2, Ven 33 32 - 45 mmHg   Bicarbonate 14.2 (L) 20.0 - 28.0 mmol/L   Acid-base deficit 12.3 (H) 0.0 - 2.0 mmol/L   O2 Saturation 40.8 %   Patient temperature 36.8    Collection site BLOOD RIGHT ARM    Drawn by MM     Comment: Performed at [facility], [address], [place], [place] [address]   No results found.  Pending Labs Unresulted Labs (From admission, onward)     Start     Ordered   07/26/23 0500  Renal function panel  Tomorrow morning,   R        07/25/23 2027   07/25/23 2003  Hepatitis B surface antigen  (New Admission Hemo Labs (Hepatitis B))  Once,   URGENT  07/25/23 2010    07/25/23 2003  Hepatitis B surface antibody,quantitative  (New Admission Hemo Labs (Hepatitis B))  Once,   URGENT        07/25/23 2010   Signed and Held  Renal function panel  Once,   R        Signed and Held   Signed and Held  CBC  Once,   R        Signed and Held   Signed and Held  CBC  (heparin )  Once,   R       Comments: Baseline for heparin  therapy IF NOT ALREADY DRAWN.  Notify MD if PLT < 100 K.    Signed and Held   Signed and Held  Creatinine, serum  (heparin )  Once,   R       Comments: Baseline for heparin  therapy IF NOT ALREADY DRAWN.    Signed and Held            Vitals/Pain Today's Vitals   07/25/23 1613 07/25/23 1615 07/25/23 1845 07/25/23 1955  BP:  93/80 (!) 134/95   Pulse:  97    Resp:  (!) 24    Temp:  97.8 F (36.6 C)    SpO2:  100%  94%  Weight: 134 lb 14.7 oz (61.2 kg)     Height: 5' 7 (1.702 m)     PainSc: 6        Isolation Precautions No active isolations  Medications Medications  sodium bicarbonate  tablet 1,300 mg (has no administration in time range)  calcium  gluconate 2 g/ 100 mL sodium chloride  IVPB (has no administration in time range)  sodium zirconium cyclosilicate  (LOKELMA ) packet 10 g (has no administration in time range)  Chlorhexidine  Gluconate Cloth 2 % PADS 6 each (has no administration in time range)  sodium zirconium cyclosilicate  (LOKELMA ) packet 10 g (10 g Oral Given 07/25/23 2042)  calcium  gluconate inj 10% (1 g) URGENT USE ONLY! (1 g Intravenous Given 07/25/23 2026)  albuterol  (PROVENTIL ) (2.5 MG/3ML) 0.083% nebulizer solution 10 mg (10 mg Nebulization Given 07/25/23 1952)  insulin  aspart (novoLOG ) injection 5 Units (5 Units Intravenous Given 07/25/23 2027)    And  dextrose  50 % solution 50 mL (50 mLs Intravenous Given 07/25/23 2027)  sodium bicarbonate  injection 50 mEq (50 mEq Intravenous Given 07/25/23 2027)    Mobility walks     Focused Assessments Renal Assessment Handoff:  Hemodialysis Schedule: Hemodialysis  Schedule: Tuesday/Thursday/Saturday Last Hemodialysis date and time: 07/20/23   Restricted appendage: left arm   R Recommendations: See Admitting Provider Note  Report given to:   Additional Notes:  Alert and oriented, said he missed Saturday due to snow but he has no excuse for today. Has not taken his home medications in 4 days. Is cooperative with our treatments.

## 2023-07-25 NOTE — ED Provider Notes (Signed)
 [place] EMERGENCY DEPARTMENT AT [facility] Provider Note   CSN: [id number] Arrival date & time: 07/25/23  1515     History  No chief complaint on file.   Damon Williams is a 63 y.o. male.  HPI 63 year old male presents with a chief complaint of rectal pain.  He has been having diarrhea today, estimating 10 episodes prior to arrival.  He had pain with wiping but no blood.  He states that the last bowel movement he had in the emergency department was actually more normal though he still has some pain, though it is improving.  He also endorses that he has not had dialysis since 5 days ago (missed Saturday as well as today, Tuesday).  He denies new leg swelling, states this is at baseline.  He denies any shortness of breath, chest pain or overall weakness.  Home Medications Prior to Admission medications   Medication Sig Start Date End Date Taking? Authorizing Provider  amLODipine  (NORVASC ) 5 MG tablet Take 5 mg by mouth daily. 07/20/23  Yes [provider]  calcium  acetate (PHOSLO ) 667 MG capsule Take 667 mg by mouth 3 (three) times daily. 07/20/23  Yes [provider]  furosemide  (LASIX ) 40 MG tablet Take 1 tablet (40 mg total) by mouth See admin instructions. Take 40 mg 4 times a week on non-dialysis days PRN for swelling,SOB, weight gain 03/15/23  Yes Miriam Norris, NP  hydrALAZINE  (APRESOLINE ) 25 MG tablet Take 1 tablet (25 mg total) by mouth 3 (three) times daily. 02/07/23  Yes Pearlean Manus, MD  isosorbide  mononitrate (IMDUR ) 30 MG 24 hr tablet Take 0.5 tablets (15 mg total) by mouth daily. 02/07/23  Yes Emokpae, Courage, MD  telmisartan (MICARDIS) 80 MG tablet Take 80 mg by mouth daily. 06/26/23  Yes [provider]      Allergies    Patient has no known allergies.    Review of Systems   Review of Systems  Respiratory:  Negative for shortness of breath.   Cardiovascular:  Negative for chest pain.  Gastrointestinal:  Positive for diarrhea  and rectal pain. Negative for blood in stool.  Neurological:  Negative for weakness.    Physical Exam Updated Vital Signs BP 119/87   Pulse (!) 102   Temp 98.1 F (36.7 C)   Resp (!) 26   Ht 5' 7 (1.702 m)   Wt 61.2 kg   SpO2 97%   BMI 21.13 kg/m  Physical Exam Vitals and nursing note reviewed. Exam conducted with a chaperone present.  Constitutional:      Appearance: He is well-developed.  HENT:     Head: Normocephalic and atraumatic.  Cardiovascular:     Rate and Rhythm: Normal rate and regular rhythm.     Heart sounds: Normal heart sounds.  Pulmonary:     Effort: Pulmonary effort is normal.     Breath sounds: Normal breath sounds. No wheezing or rales.  Abdominal:     Palpations: Abdomen is soft.     Tenderness: There is no abdominal tenderness.  Genitourinary:    Comments: No obvious hemorrhoids.  However he appears to have generalized rawness around his anus consistent with frequent wiping from diarrhea and superficial injury.  No bleeding.  Digital rectal exam not performed due to patient discomfort. Musculoskeletal:     Right lower leg: Edema present.     Left lower leg: Edema present.  Skin:    General: Skin is warm and dry.  Neurological:  Mental Status: He is alert.     ED Results / Procedures / Treatments   Labs (all labs ordered are listed, but only abnormal results are displayed) Labs Reviewed  CBC WITH DIFFERENTIAL/PLATELET - Abnormal; Notable for the following components:      Result Value   RBC 3.89 (*)    Hemoglobin 11.5 (*)    HCT 35.9 (*)    RDW 17.3 (*)    Platelets 106 (*)    nRBC 1.3 (*)    Lymphs Abs 0.4 (*)    All other components within normal limits  BASIC METABOLIC PANEL - Abnormal; Notable for the following components:   Potassium 6.2 (*)    CO2 12 (*)    Glucose, Bld 144 (*)    BUN 130 (*)    Creatinine, Ser 17.15 (*)    Calcium  5.8 (*)    GFR, Estimated 3 (*)    Anion gap 27 (*)    All other components within normal  limits  HEPATIC FUNCTION PANEL - Abnormal; Notable for the following components:   Albumin 2.8 (*)    AST 286 (*)    ALT 731 (*)    Alkaline Phosphatase 145 (*)    Total Bilirubin 2.0 (*)    Bilirubin, Direct 1.0 (*)    Indirect Bilirubin 1.0 (*)    All other components within normal limits  BLOOD GAS, VENOUS - Abnormal; Notable for the following components:   pH, Ven 7.23 (*)    pCO2, Ven 34 (*)    Bicarbonate 14.2 (*)    Acid-base deficit 12.3 (*)    All other components within normal limits  MRSA NEXT GEN BY PCR, NASAL  HEPATITIS B SURFACE ANTIGEN  HEPATITIS B SURFACE ANTIBODY, QUANTITATIVE  RENAL FUNCTION PANEL  CBC  CREATININE, SERUM    EKG EKG Interpretation Date/Time:  Tuesday July 25 2023 19:25:35 EST Ventricular Rate:  92 PR Interval:  170 QRS Duration:  100 QT Interval:  410 QTC Calculation: 508 R Axis:   87  Text Interpretation: Sinus rhythm Probable left atrial enlargement Left ventricular hypertrophy Anterior infarct, old Prolonged QT interval Peaked T waves compared to Jul 13 2023 Confirmed by Freddi Hamilton [id number]) on 07/25/2023 7:29:06 PM  Radiology DG CHEST PORT 1 VIEW Result Date: 07/25/2023 CLINICAL DATA:  Hypoxia. EXAM: PORTABLE CHEST 1 VIEW COMPARISON:  May 08, 2023 FINDINGS: There is stable mild to moderate severity cardiac silhouette enlargement. Very mild atelectasis and/or infiltrate is suspected along the periphery of the mid to upper left lung. No pleural effusion or pneumothorax is identified. The visualized skeletal structures are unremarkable. IMPRESSION: Stable cardiomegaly with very mild atelectasis and/or infiltrate along the periphery of the mid to upper left lung. Electronically Signed   By: Suzen Dials M.D.   On: 07/25/2023 21:36    Procedures .Critical Care  Performed by: Freddi Hamilton, MD Authorized by: Freddi Hamilton, MD   Critical care provider statement:    Critical care time (minutes):  40   Critical care time  was exclusive of:  Separately billable procedures and treating other patients   Critical care was necessary to treat or prevent imminent or life-threatening deterioration of the following conditions:  Metabolic crisis   Critical care was time spent personally by me on the following activities:  Development of treatment plan with patient or surrogate, discussions with consultants, evaluation of patient's response to treatment, examination of patient, ordering and review of laboratory studies, ordering and review of radiographic studies, ordering and  performing treatments and interventions, pulse oximetry, re-evaluation of patient's condition and review of old charts     Medications Ordered in ED Medications  sodium bicarbonate  tablet 1,300 mg (1,300 mg Oral Given 07/25/23 2116)  sodium zirconium cyclosilicate  (LOKELMA ) packet 10 g (10 g Oral Given 07/25/23 2208)  Chlorhexidine  Gluconate Cloth 2 % PADS 6 each (has no administration in time range)  acetaminophen  (TYLENOL ) tablet 650 mg (has no administration in time range)    Or  acetaminophen  (TYLENOL ) suppository 650 mg (has no administration in time range)  polyethylene glycol (MIRALAX  / GLYCOLAX ) packet 17 g (has no administration in time range)  bisacodyl  (DULCOLAX) EC tablet 5 mg (has no administration in time range)  heparin  injection 5,000 Units (has no administration in time range)  ondansetron  (ZOFRAN ) tablet 4 mg (has no administration in time range)    Or  ondansetron  (ZOFRAN ) injection 4 mg (has no administration in time range)  nicotine  (NICODERM CQ  - dosed in mg/24 hours) patch 14 mg (has no administration in time range)  amLODipine  (NORVASC ) tablet 5 mg (5 mg Oral Given 07/25/23 2208)  isosorbide  mononitrate (IMDUR ) 24 hr tablet 15 mg (15 mg Oral Given 07/25/23 2207)  calcium  gluconate 1 g/ 50 mL sodium chloride  IVPB (1,000 mg Intravenous New Bag/Given 07/25/23 2252)  sodium zirconium cyclosilicate  (LOKELMA ) packet 10 g (10 g Oral  Given 07/25/23 2042)  calcium  gluconate inj 10% (1 g) URGENT USE ONLY! (1 g Intravenous Given 07/25/23 2026)  albuterol  (PROVENTIL ) (2.5 MG/3ML) 0.083% nebulizer solution 10 mg (10 mg Nebulization Given 07/25/23 1952)  insulin  aspart (novoLOG ) injection 5 Units (5 Units Intravenous Given 07/25/23 2027)    And  dextrose  50 % solution 50 mL (50 mLs Intravenous Given 07/25/23 2027)  sodium bicarbonate  injection 50 mEq (50 mEq Intravenous Given 07/25/23 2027)    ED Course/ Medical Decision Making/ A&P                                 Medical Decision Making Amount and/or Complexity of Data Reviewed Labs: ordered.    Details: Hyperkalemia along with hypocalcemia.  Has metabolic acidosis. ECG/medicine tests: ordered and independent interpretation performed.    Details: Peaked T waves  Risk OTC drugs. Prescription drug management. Decision regarding hospitalization.   Patient presents with a complaint of diarrhea though he states that is improved.  However the bigger concern is that he has missed 2 dialysis sessions and is found to be hyperkalemic, acidotic, and hypocalcemic.  Fortunately, he actually appears well on exam.  He has peripheral edema but states that is chronic.  Does not appear overtly fluid overloaded.  Started on calcium , Lokelma , insulin , glucose, albuterol , and a bicarb dose.  Discussed with Dr. Gearline, she will adjust some of his meds as well and get him dialyzed first thing in the morning.  He is not distressed.  Discussed with Dr. Lenon, who will admit.        Final Clinical Impression(s) / ED Diagnoses Final diagnoses:  Hyperkalemia  Metabolic acidosis  ESRD (end stage renal disease) on dialysis [facility])    Rx / DC Orders ED Discharge Orders     None         Freddi Hamilton, MD 07/25/23 2348

## 2023-07-25 NOTE — H&P (Signed)
 History and Physical  ANANTH FIALLOS FMW:[id number] DOB: 05-29-1961 DOA: 07/25/2023 PCP: Benjamin Raina Elizabeth, NP  Chief Complaint: rectal pain Historian: patient  HPI:  Damon Williams is a 63 y.o. male with a PMH significant for ESRD on HD TThS, cardiomyopathy, tobacco use, HTN, anemia, CHF. At baseline, they live in a motel and are independent for their ADLs.  They presented from home to the ED on 07/25/2023 with rectal pain.  He states that he had several episodes of diarrhea over the past week and due to frequent wiping had pain of his rectum.  Tried to treat with alcohol wipe which caused more pain.  Denies any blood in his stool or with wiping.  He endorses that his diarrhea has resolved at this time. He also endorses missing his last 2 sessions of HD because he forgot.  He has not taken any of his medications for at least 4 days due to dropping all of them in the toilet on accident and has not requested any refills. He denies any shortness of breath, chest pain. His legs are swollen but he states that they are at their baseline level and have not been worsened recently.   In the ED, it was found that they had stable vital signs while on room air.  Observed him aspirating some of the fluid from his albuterol  nebulizer and subsequently had coughing spell which dropped his O2 sats down to the 80s while coughing and quickly recovered back to high 90s when he was not coughing any longer.  He is alert and oriented x 3. Significant findings included: WBC 6.1, Hgb 11.5, platelets 106.  Na+ 137, K+ 6.2, glucose 144, BUN 130, Cr 17.15, Ca++ 5.8.  Anion gap 27. AST 286, ALT 731, alk phos 145, total bilirubin 2.0.  They were initially treated with albuterol  nebulizer, calcium  gluconate, insulin , sodium bicarb.   Patient was admitted to medicine service for further workup and management of missed HD with fluid overload and hyperkalemia as outlined in detail below.  Assessment/Plan Active  Problems:   Acute hyperkalemia   ESRD (end stage renal disease) (HCC)   Hyperkalemia- ESRD-routine dialysis TThS but has missed his last 2 sessions.  His labs reflect hyperkalemia of K+ 6.2 and peaked T waves on ECG.  Nephrology has been consulted and initiated treatment with calcium  gluconate, Lokelma , albuterol , insulin  and plans to treat with HD in the morning. -Follow-up with nephrology -RFP a.m. -HD a.m. -Continuous cardiac telemetry -Continue sodium bicarbonate  tablets  HTN-pressures mildly elevated on presentation and he admits to not taking his medications for at least 4 days. 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  Hemoglobin stable at baseline.  11.5 on presentation. -Monitor routinely  Tobacco user -Nicotine  replacement as needed  HFrEF-last echo 11/04/2022 EF 30 to 35%.  Acutely exacerbated with pitting edema to lower extremities.  He states this is his baseline level of edema.  Not significant congestion on chest x-ray. -Volume treatment with urgent HD -Continue home medications -Strict I's/O -Daily weights  Transaminitis-significantly elevated AST, ALT, alk phos.  In context of recent diarrheal illness patient may have had viral infection and suspect that is numbers are elevated due to the lack of dialysis for several days.  Denies right upper quadrant pain. -Continue with HD as the plan described above and recheck liver function panel.  If remains elevated, consider right upper quadrant ultrasound.  Past Medical History:  Diagnosis Date   Cardiomyopathy (HCC)  CKD    Cocaine  use    ESRD (end stage renal disease) on dialysis (HCC) 12/05/2022   Hypertension    Tobacco abuse     Past Surgical History:  Procedure Laterality Date   AV FISTULA PLACEMENT Left 12/27/2022   Procedure: INSERTION OF LEFT ARM  ARTERIOVENOUS FISTULA;  Surgeon: Oris Krystal FALCON, MD;  Location: [facility];  Service: Vascular;  Laterality: Left;   BACK SURGERY      IR FLUORO GUIDE CV LINE RIGHT  12/07/2022   IR US  GUIDE VASC ACCESS RIGHT  12/07/2022     reports that he has been smoking cigarettes. He has a 30 pack-year smoking history. He quit smokeless tobacco use about 10 months ago. He reports that he does not currently use alcohol. He reports current drug use. Drug: Cocaine .  No Known Allergies  Family History  Problem Relation Age of Onset   Heart disease Neg Hx     Prior to Admission medications   Medication Sig Start Date End Date Taking? Authorizing Provider  amLODipine  (NORVASC ) 10 MG tablet Take 1 tablet (10 mg total) by mouth daily. 02/07/23   Pearlean Manus, MD  carvedilol  (COREG ) 12.5 MG tablet Take 1 tablet (12.5 mg total) by mouth 2 (two) times daily with a meal. 02/07/23   Emokpae, Courage, MD  furosemide  (LASIX ) 40 MG tablet Take 1 tablet (40 mg total) by mouth See admin instructions. Take 40 mg 4 times a week on non-dialysis days PRN for swelling,SOB, weight gain 03/15/23   Miriam Norris, NP  hydrALAZINE  (APRESOLINE ) 25 MG tablet Take 1 tablet (25 mg total) by mouth 3 (three) times daily. 02/07/23   Pearlean Manus, MD  isosorbide  mononitrate (IMDUR ) 30 MG 24 hr tablet Take 0.5 tablets (15 mg total) by mouth daily. 02/07/23   Pearlean Manus, MD  mupirocin  ointment (BACTROBAN ) 2 % Apply 1 Application topically 2 (two) times daily. 03/12/23   Silver Wonda LABOR, PA  traZODone  (DESYREL ) 100 MG tablet Take 100 mg by mouth at bedtime.    [provider]   I have personally, briefly reviewed patient's prior medical records in [facility]  Objective: Blood pressure (!) 134/95, pulse 97, temperature 97.8 F (36.6 C), resp. rate (!) 24, height 5' 7 (1.702 m), weight 61.2 kg, SpO2 94%.   Constitutional: NAD, calm, comfortable HEENT: lids and conjunctivae normal. MMM. Posterior pharynx clear of any exudate or lesions. Normal dentition.  Neck: normal, supple, no masses, no thyromegaly Respiratory: CTAB, no wheezing, no  crackles. Normal respiratory effort. No accessory muscle use.  Intermittent coughing after aspirated on his albuterol  solution Cardiovascular: RRR, no murmurs.  3+ pitting edema bilateral lower extremities Abdomen: soft, NT, ND, no masses or HSM palpated. Musculoskeletal: No joint deformity upper and lower extremities. Normal muscle tone.  Skin: Several superficial ulcers on lower extremities without drainage Neurologic: Alert and oriented x 3. Normal speech. Grossly non-focal exam. PERRL  Labs on Admission: I have personally reviewed admission labs and imaging studies  CBC    Component Value Date/Time   WBC 6.1 07/25/2023 1628   RBC 3.89 (L) 07/25/2023 1628   HGB 11.5 (L) 07/25/2023 1628   HCT 35.9 (L) 07/25/2023 1628   PLT 106 (L) 07/25/2023 1628   MCV 92.3 07/25/2023 1628   MCH 29.6 07/25/2023 1628   MCHC 32.0 07/25/2023 1628   RDW 17.3 (H) 07/25/2023 1628   LYMPHSABS 0.4 (L) 07/25/2023 1628   MONOABS 0.8 07/25/2023 1628   EOSABS 0.0 07/25/2023 1628  BASOSABS 0.0 07/25/2023 1628   CMP     Component Value Date/Time   NA 137 07/25/2023 1628   K 6.2 (H) 07/25/2023 1628   CL 98 07/25/2023 1628   CO2 12 (L) 07/25/2023 1628   GLUCOSE 144 (H) 07/25/2023 1628   BUN 130 (H) 07/25/2023 1628   CREATININE 17.15 (H) 07/25/2023 1628   CALCIUM  5.8 (LL) 07/25/2023 1628   CALCIUM  8.2 (L) 11/08/2022 0430   PROT 6.6 07/25/2023 1628   ALBUMIN 2.8 (L) 07/25/2023 1628   AST 286 (H) 07/25/2023 1628   ALT 731 (H) 07/25/2023 1628   ALKPHOS 145 (H) 07/25/2023 1628   BILITOT 2.0 (H) 07/25/2023 1628   GFRNONAA 3 (L) 07/25/2023 1628    Radiological Exams on Admission: No results found.  EKG: Independently reviewed. NSR. Anterior-lateral peaked T waves  DVT prophylaxis: heparin   Code Status: full  Family Communication: none   Disposition Plan: admit to stepdown due to hyperkalemia   Consults called: nephrology    Marien LITTIE Piety, DO [facility]  07/25/2023, 8:16 PM     To contact the appropriate [facility] Attending or Consulting provider: Check [email or web address] for coverage from 7pm-7am

## 2023-07-26 ENCOUNTER — Inpatient Hospital Stay (HOSPITAL_COMMUNITY): Payer: 59

## 2023-07-26 DIAGNOSIS — R7401 Elevation of levels of liver transaminase levels: Secondary | ICD-10-CM | POA: Diagnosis not present

## 2023-07-26 DIAGNOSIS — N186 End stage renal disease: Secondary | ICD-10-CM | POA: Diagnosis not present

## 2023-07-26 DIAGNOSIS — E875 Hyperkalemia: Secondary | ICD-10-CM | POA: Diagnosis not present

## 2023-07-26 LAB — HEPATIC FUNCTION PANEL
ALT: 616 U/L — ABNORMAL HIGH (ref 0–44)
AST: 196 U/L — ABNORMAL HIGH (ref 15–41)
Albumin: 2.6 g/dL — ABNORMAL LOW (ref 3.5–5.0)
Alkaline Phosphatase: 170 U/L — ABNORMAL HIGH (ref 38–126)
Bilirubin, Direct: 0.7 mg/dL — ABNORMAL HIGH (ref 0.0–0.2)
Indirect Bilirubin: 0.9 mg/dL (ref 0.3–0.9)
Total Bilirubin: 1.6 mg/dL — ABNORMAL HIGH (ref 0.0–1.2)
Total Protein: 6.4 g/dL — ABNORMAL LOW (ref 6.5–8.1)

## 2023-07-26 LAB — PROTIME-INR
INR: 1.8 — ABNORMAL HIGH (ref 0.8–1.2)
Prothrombin Time: 21.4 s — ABNORMAL HIGH (ref 11.4–15.2)

## 2023-07-26 LAB — RENAL FUNCTION PANEL
Albumin: 2.6 g/dL — ABNORMAL LOW (ref 3.5–5.0)
Anion gap: 25 — ABNORMAL HIGH (ref 5–15)
BUN: 139 mg/dL — ABNORMAL HIGH (ref 6–20)
CO2: 16 mmol/L — ABNORMAL LOW (ref 22–32)
Calcium: 6.4 mg/dL — CL (ref 8.9–10.3)
Chloride: 95 mmol/L — ABNORMAL LOW (ref 98–111)
Creatinine, Ser: 17.4 mg/dL — ABNORMAL HIGH (ref 0.61–1.24)
GFR, Estimated: 3 mL/min — ABNORMAL LOW (ref >60)
Glucose, Bld: 128 mg/dL — ABNORMAL HIGH (ref 70–99)
Phosphorus: 12.9 mg/dL — ABNORMAL HIGH (ref 2.5–4.6)
Potassium: 4.9 mmol/L (ref 3.5–5.1)
Sodium: 136 mmol/L (ref 135–145)

## 2023-07-26 LAB — MRSA NEXT GEN BY PCR, NASAL: MRSA by PCR Next Gen: NOT DETECTED

## 2023-07-26 LAB — HEPATITIS PANEL, ACUTE
HCV Ab: NONREACTIVE
Hep A IgM: NONREACTIVE
Hep B C IgM: NONREACTIVE
Hepatitis B Surface Ag: NONREACTIVE

## 2023-07-26 LAB — HEPATITIS B SURFACE ANTIGEN: Hepatitis B Surface Ag: NONREACTIVE

## 2023-07-26 MED ORDER — ORAL CARE MOUTH RINSE: 15.0000 mL | OROMUCOSAL | Status: DC | PRN

## 2023-07-26 MED ORDER — LIDOCAINE-PRILOCAINE 2.5-2.5 % EX CREA: 1.0000 | TOPICAL_CREAM | CUTANEOUS | Status: DC | PRN

## 2023-07-26 MED ORDER — FUROSEMIDE 40 MG PO TABS: 40.0000 mg | ORAL_TABLET | ORAL | Status: DC

## 2023-07-26 MED ORDER — PENTAFLUOROPROP-TETRAFLUOROETH EX AERO
1.0000 | INHALATION_SPRAY | CUTANEOUS | Status: DC | PRN
Start: 2023-07-26 — End: 2023-07-27

## 2023-07-26 MED ORDER — LIDOCAINE HCL (PF) 1 % IJ SOLN: 5.0000 mL | INTRAMUSCULAR | Status: DC | PRN

## 2023-07-26 MED ORDER — HYDRALAZINE HCL 25 MG PO TABS
25.0000 mg | ORAL_TABLET | Freq: Three times a day (TID) | ORAL | Status: DC
  Administered 2023-07-26 (×2): 25 mg via ORAL
  Filled 2023-07-26 (×3): qty 1

## 2023-07-26 MED ORDER — IRBESARTAN 150 MG PO TABS
75.0000 mg | ORAL_TABLET | Freq: Every day | ORAL | Status: DC
Start: 2023-07-26 — End: 2023-07-27
  Administered 2023-07-26: 75 mg via ORAL
  Filled 2023-07-26 (×2): qty 1

## 2023-07-26 MED ORDER — CALCIUM ACETATE (PHOS BINDER) 667 MG PO CAPS
667.0000 mg | ORAL_CAPSULE | Freq: Three times a day (TID) | ORAL | Status: DC
  Administered 2023-07-26 – 2023-07-27 (×3): 667 mg via ORAL
  Filled 2023-07-26 (×5): qty 1

## 2023-07-26 NOTE — Procedures (Signed)
 I was present at this dialysis session. I have reviewed the session itself and made appropriate changes.   Vital signs in last 24 hours:  Temp:  [97.8 F (36.6 C)-98.1 F (36.7 C)] 97.8 F (36.6 C) (01/15 0945) Pulse Rate:  [89-105] 89 (01/15 0945) Resp:  [15-33] 16 (01/15 0945) BP: (93-155)/(78-106) 131/99 (01/15 0945) SpO2:  [94 %-100 %] 95 % (01/15 0945) Weight:  [61.2 kg-69.5 kg] 69.5 kg (01/15 0434) Weight change:  Filed Weights   07/25/23 1613 07/26/23 0002 07/26/23 0434  Weight: 61.2 kg 69.5 kg 69.5 kg    Recent Labs  Lab 07/26/23 0516  NA 136  K 4.9  CL 95*  CO2 16*  GLUCOSE 128*  BUN 139*  CREATININE 17.40*  CALCIUM  6.4*  PHOS 12.9*    Recent Labs  Lab 07/25/23 1628  WBC 6.1  NEUTROABS 4.8  HGB 11.5*  HCT 35.9*  MCV 92.3  PLT 106*    Scheduled Meds:  amLODipine   5 mg Oral Daily   calcium  acetate  667 mg Oral TID WC   Chlorhexidine  Gluconate Cloth  6 each Topical Q0600   furosemide   40 mg Oral See admin instructions   heparin   5,000 Units Subcutaneous Q8H   hydrALAZINE   25 mg Oral TID   irbesartan   75 mg Oral Daily   isosorbide  mononitrate  15 mg Oral Daily   polyethylene glycol  17 g Oral Daily   sodium bicarbonate   1,300 mg Oral TID   sodium zirconium cyclosilicate   10 g Oral TID   Continuous Infusions: PRN Meds:.acetaminophen  **OR** acetaminophen , bisacodyl , lidocaine  (PF), lidocaine -prilocaine , nicotine , ondansetron  **OR** ondansetron  (ZOFRAN ) IV, mouth rinse, pentafluoroprop-tetrafluoroeth   Benjamin Brands,  MD 07/26/2023, 10:17 AM

## 2023-07-26 NOTE — Plan of Care (Signed)
  Problem: Nutrition: Goal: Adequate nutrition will be maintained Outcome: Progressing   Problem: Elimination: Goal: Will not experience complications related to bowel motility Outcome: Progressing   Problem: Pain Management: Goal: General experience of comfort will improve Outcome: Progressing   Problem: Safety: Goal: Ability to remain free from injury will improve Outcome: Progressing

## 2023-07-26 NOTE — Plan of Care (Signed)

## 2023-07-26 NOTE — Progress Notes (Signed)
   HEMODIALYSIS TREATMENT NOTE:  Uneventful 3.5 hour heparin -free treatment completed using left upper arm AVF (16g/antegrade). Goal met: 3.4 liters removed without interruption in UF.  All blood was returned.  Hemostasis was achieved in 5 minutes.  No changes from pre-HD assessment.  Post-HD:  07/26/23 1355  Vitals  Temp 97.9 F (36.6 C)  Temp Source Oral  BP (!) 146/100  MAP (mmHg) 111  BP Location Right Wrist  BP Method Automatic  Patient Position (if appropriate) Lying  Pulse Rate 92  Pulse Rate Source Monitor  ECG Heart Rate 93  Resp 20  Oxygen Therapy  SpO2 100 %  O2 Device Room Air  Post Treatment  Dialyzer Clearance Lightly streaked  Hemodialysis Intake (mL) 0 mL  Liters Processed 64  Fluid Removed (mL) 3400 mL  Tolerated HD Treatment Yes  Post-Hemodialysis Comments Goal met  AVG/AVF Arterial Site Held (minutes) 7 minutes  AVG/AVF Venous Site Held (minutes) 7 minutes  Fistula / Graft Left Upper arm Arteriovenous fistula  Placement Date/Time: 12/27/22 0835   Placed prior to admission: No  Orientation: Left  Access Location: Upper arm  Access Type: Arteriovenous fistula  Fistula / Graft Assessment Thrill;Bruit  Status Patent    Damon Haff, RN [facility]

## 2023-07-26 NOTE — Progress Notes (Signed)
 PROGRESS NOTE   Damon Williams  FAO:[id number] DOB: Dec 04, 1960 DOA: 07/25/2023 PCP: Brantley Caldwell, NP   No chief complaint on file.  Level of care: Stepdown  Brief Admission History:  63 y.o. male with a PMH significant for ESRD on HD TThS, cardiomyopathy, tobacco use, HTN, anemia, CHF. At baseline, they live in a motel and are independent for their ADLs.   They presented from home to the ED on 07/25/2023 with rectal pain.  He states that he had several episodes of diarrhea over the past week and due to frequent wiping had pain of his rectum.  Tried to treat with alcohol wipe which caused more pain.  Denies any blood in his stool or with wiping.  He endorses that his diarrhea has resolved at this time. He also endorses missing his last 2 sessions of HD because he "forgot".  He has not taken any of his medications for at least 4 days due to dropping all of them in the toilet on accident and has not requested any refills. He denies any shortness of breath, chest pain. His legs are swollen but he states that they are at their baseline level and have not been worsened recently.    In the ED, it was found that they had stable vital signs while on room air.  Observed him aspirating some of the fluid from his albuterol  nebulizer and subsequently had coughing spell which dropped his O2 sats down to the 80s while coughing and quickly recovered back to high 90s when he was not coughing any longer.  He is alert and oriented x 3. Significant findings included: WBC 6.1, Hgb 11.5, platelets 106.  Na+ 137, K+ 6.2, glucose 144, BUN 130, Cr 17.15, Ca++ 5.8.  Anion gap 27. AST 286, ALT 731, alk phos 145, total bilirubin 2.0.   They were initially treated with albuterol  nebulizer, calcium  gluconate, insulin , sodium bicarb.    Patient was admitted to medicine service for further workup and management of missed HD with fluid overload and hyperkalemia as outlined in detail below.   Assessment and  Plan:  Hyperkalemia-resolved ESRD-routine dialysis TThS but has missed his last 2 sessions.   -nephrology planning HD treatment today and tomorrow  -Continuous cardiac telemetry -Continue sodium bicarbonate  tablets   HTN-pressures mildly elevated on presentation and he admits to not taking his medications for at least 4 days. 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  Hemoglobin stable at baseline.  11.5 on presentation. -Monitor CBC for H/H   Tobacco user -Nicotine  replacement as needed   HFrEF-last echo 11/04/2022 EF 30 to 35%.  Acutely exacerbated with pitting edema to lower extremities.  He states this is his baseline level of edema.  Not significant congestion on chest x-ray. -Volume treatment with HD today and tomorrow -Continue home medications -Strict I's/O -Daily weights   Transaminitis-significantly elevated AST, ALT, alk phos.   -reported recent diarrheal illness -Checking right upper quadrant ultrasound -check PT/INR, added hepatic function panel to AM labs -if remains elevated or trending higher will ask for GI eval   DVT prophylaxis:  heparin  Code Status: Full  Family Communication:  Disposition: anticipate home in 1-2 days   Consultants:  nephrology Procedures:   Antimicrobials:    Subjective: Pt upset about right arm peripheral IV causing pain with BP cuff inflation.  Otherwise he denies SOB and chest pain.   Objective: Vitals:   07/26/23 0600 07/26/23 0744 07/26/23 0945 07/26/23 1004  BP: 131/78  Aaron Aas)  131/99 (!) 138/100  Pulse: 94 93 89   Resp:   16 14  Temp:  97.9 F (36.6 C) 97.8 F (36.6 C)   TempSrc:  Oral Oral   SpO2: 98% 99% 95%   Weight:   69.8 kg   Height:        Intake/Output Summary (Last 24 hours) at 07/26/2023 1040 Last data filed at 07/26/2023 0423 Gross per 24 hour  Intake 414.68 ml  Output --  Net 414.68 ml   Filed Weights   07/26/23 0002 07/26/23 0434 07/26/23 0945  Weight: 69.5 kg 69.5 kg  69.8 kg   Examination:  General exam: Appears calm and comfortable  Respiratory system: bibasilar crackles heard.  Respiratory effort normal. Cardiovascular system: normal S1 & S2 heard. No JVD, murmurs, rubs, gallops or clicks. 1+ pedal edema. Gastrointestinal system: Abdomen is nondistended, soft and nontender. No organomegaly or masses felt. Normal bowel sounds heard. Central nervous system: Alert and oriented. No focal neurological deficits. Extremities: Symmetric 5 x 5 power. Skin: No rashes, lesions or ulcers. Psychiatry: Judgement and insight appear poor. Mood & affect irritable.   Data Reviewed: I have personally reviewed following labs and imaging studies  CBC: Recent Labs  Lab 07/25/23 1628  WBC 6.1  NEUTROABS 4.8  HGB 11.5*  HCT 35.9*  MCV 92.3  PLT 106*    Basic Metabolic Panel: Recent Labs  Lab 07/25/23 1628 07/26/23 0516  NA 137 136  K 6.2* 4.9  CL 98 95*  CO2 12* 16*  GLUCOSE 144* 128*  BUN 130* 139*  CREATININE 17.15* 17.40*  CALCIUM  5.8* 6.4*  PHOS  --  12.9*    CBG: No results for input(s): "GLUCAP" in the last 168 hours.  Recent Results (from the past 240 hours)  MRSA Next Gen by PCR, Nasal     Status: None   Collection Time: 07/25/23 11:44 PM   Specimen: Nasal Mucosa; Nasal Swab  Result Value Ref Range Status   MRSA by PCR Next Gen NOT DETECTED NOT DETECTED Final    Comment: (NOTE) The GeneXpert MRSA Assay (FDA approved for NASAL specimens only), is one component of a comprehensive MRSA colonization surveillance program. It is not intended to diagnose MRSA infection nor to guide or monitor treatment for MRSA infections. Test performance is not FDA approved in patients less than 53 years old. Performed at [facility], [address], [place], [place] [address]      Radiology Studies: DG CHEST PORT 1 VIEW Result Date: 07/25/2023 CLINICAL DATA:  Hypoxia. EXAM: PORTABLE CHEST 1 VIEW COMPARISON:  May 08, 2023 FINDINGS: There is stable  mild to moderate severity cardiac silhouette enlargement. Very mild atelectasis and/or infiltrate is suspected along the periphery of the mid to upper left lung. No pleural effusion or pneumothorax is identified. The visualized skeletal structures are unremarkable. IMPRESSION: Stable cardiomegaly with very mild atelectasis and/or infiltrate along the periphery of the mid to upper left lung. Electronically Signed   By: Virgle Grime M.D.   On: 07/25/2023 21:36    Scheduled Meds:  amLODipine   5 mg Oral Daily   calcium  acetate  667 mg Oral TID WC   Chlorhexidine  Gluconate Cloth  6 each Topical Q0600   furosemide   40 mg Oral See admin instructions   heparin   5,000 Units Subcutaneous Q8H   hydrALAZINE   25 mg Oral TID   irbesartan   75 mg Oral Daily   isosorbide  mononitrate  15 mg Oral Daily   polyethylene glycol  17  g Oral Daily   sodium bicarbonate   1,300 mg Oral TID   Continuous Infusions:   LOS: 1 day   Critical Care Procedure Note Authorized and Performed by: Olga Berthold MD  Total Critical Care time:  54 mins Due to a high probability of clinically significant, life threatening deterioration, the patient required my highest level of preparedness to intervene emergently and I personally spent this critical care time directly and personally managing the patient.  This critical care time included obtaining a history; examining the patient, pulse oximetry; ordering and review of studies; arranging urgent treatment with development of a management plan; evaluation of patient's response of treatment; frequent reassessment; and discussions with other providers.  This critical care time was performed to assess and manage the high probability of imminent and life threatening deterioration that could result in multi-organ failure.  It was exclusive of separately billable procedures and treating other patients and teaching time.    Faustino Hook, MD How to contact the [facility] Attending or Consulting  provider 7A - 7P or covering provider during after hours 7P -7A, for this patient?  Check the care team in [facility] and look for a) attending/consulting [facility] provider listed and b) the [facility] team listed Log into [email or web address] to find provider on call.  Locate the [facility] provider you are looking for under [facility] and page to a number that you can be directly reached. If you still have difficulty reaching the provider, please page the [facility] (Director on Call) for the Hospitalists listed on amion for assistance.  07/26/2023, 10:40 AM

## 2023-07-26 NOTE — Consult Note (Signed)
 [facility] Renal Consultation Note    Indication for Consultation:  Management of ESRD/hemodialysis; anemia, hypertension/volume and secondary hyperparathyroidism  HPI: Damon Williams is a 63 y.o. 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  He also reports that he missed his last 2 HD sessions.  He has not taken any of his medications in the last 4 days or longer.  In the ED, Temp 97.8, Bp 93/80, HR 97, RR 24, SpO2 100%.  Labs notable for K 6.2, Co2 12, BUN 130, Cr 17.15, Ca 5.8, alb 2.8, AST 286, ALT 731, T bili 2, Hgb 11.5, plt 106.  CXR stable. He is being admitted for further workup and we were consulted to provide dialysis during his hospitalization.  He reports he is feeling better this morning.  Past Medical History:  Diagnosis Date   Cardiomyopathy [facility])    CKD    Cocaine  use    ESRD (end stage renal disease) on dialysis (HCC) 12/05/2022   Hypertension    Tobacco abuse    Past Surgical History:  Procedure Laterality Date   AV FISTULA PLACEMENT Left 12/27/2022   Procedure: INSERTION OF LEFT ARM  ARTERIOVENOUS FISTULA;  Surgeon: Mayo Speck, MD;  Location: [facility];  Service: Vascular;  Laterality: Left;   BACK SURGERY     IR FLUORO GUIDE CV LINE RIGHT  12/07/2022   IR US  GUIDE VASC ACCESS RIGHT  12/07/2022   Family History:   Family History  Problem Relation Age of Onset   Heart disease Neg Hx    Social History:  reports that he has been smoking cigarettes. He has a 30 pack-year smoking history. He quit smokeless tobacco use about 10 months ago. He reports that he does not currently use alcohol. He reports current drug use. Drug: Cocaine . No Known Allergies Prior to Admission medications   Medication Sig Start Date End Date Taking? Authorizing Provider  amLODipine  (NORVASC ) 5 MG tablet Take 5 mg by mouth daily. 07/20/23  Yes [provider]   calcium  acetate (PHOSLO ) 667 MG capsule Take 667 mg by mouth 3 (three) times daily. 07/20/23  Yes [provider]  furosemide  (LASIX ) 40 MG tablet Take 1 tablet (40 mg total) by mouth See admin instructions. Take 40 mg 4 times a week on non-dialysis days PRN for swelling,SOB, weight gain 03/15/23  Yes Lasalle Pointer, NP  hydrALAZINE  (APRESOLINE ) 25 MG tablet Take 1 tablet (25 mg total) by mouth 3 (three) times daily. 02/07/23  Yes Colin Dawley, MD  isosorbide  mononitrate (IMDUR ) 30 MG 24 hr tablet Take 0.5 tablets (15 mg total) by mouth daily. 02/07/23  Yes Emokpae, Courage, MD  telmisartan (MICARDIS) 80 MG tablet Take 80 mg by mouth daily. 06/26/23  Yes [provider]   Current Facility-Administered Medications  Medication Dose Route Frequency Provider Last Rate Last Admin   acetaminophen  (TYLENOL ) tablet 650 mg  650 mg Oral Q6H PRN Ree Candy, MD   650 mg at 07/26/23 1610   Or   acetaminophen  (TYLENOL ) suppository 650 mg  650 mg Rectal Q6H PRN Ree Candy, MD       amLODipine  (NORVASC ) tablet 5 mg  5 mg Oral Daily Ree Candy, MD   5 mg at 07/25/23 2208   bisacodyl  (DULCOLAX) EC tablet 5 mg  5 mg Oral Daily PRN Ree Candy, MD  calcium  acetate (PHOSLO ) capsule 667 mg  667 mg Oral TID WC Johnson, Clanford L, MD   667 mg at 07/26/23 0758   Chlorhexidine  Gluconate Cloth 2 % PADS 6 each  6 each Topical Q0600 Ree Candy, MD   6 each at 07/25/23 2359   furosemide  (LASIX ) tablet 40 mg  40 mg Oral See admin instructions Lincoln Renshaw, Clanford L, MD       heparin  injection 5,000 Units  5,000 Units Subcutaneous Q8H Ree Candy, MD   5,000 Units at 07/26/23 0543   hydrALAZINE  (APRESOLINE ) tablet 25 mg  25 mg Oral TID Lincoln Renshaw, Clanford L, MD       irbesartan  (AVAPRO ) tablet 75 mg  75 mg Oral Daily Johnson, Clanford L, MD       isosorbide  mononitrate (IMDUR ) 24 hr tablet 15 mg  15 mg Oral Daily Ree Candy, MD   15 mg at 07/25/23  2207   nicotine  (NICODERM CQ  - dosed in mg/24 hours) patch 14 mg  14 mg Transdermal Daily PRN Ree Candy, MD       ondansetron  (ZOFRAN ) tablet 4 mg  4 mg Oral Q6H PRN Ree Candy, MD       Or   ondansetron  (ZOFRAN ) injection 4 mg  4 mg Intravenous Q6H PRN Ree Candy, MD       Oral care mouth rinse  15 mL Mouth Rinse PRN Adefeso, Oladapo, DO       polyethylene glycol (MIRALAX  / GLYCOLAX ) packet 17 g  17 g Oral Daily Ree Candy, MD       sodium bicarbonate  tablet 1,300 mg  1,300 mg Oral TID Ree Candy, MD   1,300 mg at 07/25/23 2116   sodium zirconium cyclosilicate  (LOKELMA ) packet 10 g  10 g Oral TID Ree Candy, MD   10 g at 07/25/23 2208   Labs: Basic Metabolic Panel: Recent Labs  Lab 07/25/23 1628 07/26/23 0516  NA 137 136  K 6.2* 4.9  CL 98 95*  CO2 12* 16*  GLUCOSE 144* 128*  BUN 130* 139*  CREATININE 17.15* 17.40*  CALCIUM  5.8* 6.4*  PHOS  --  12.9*   Liver Function Tests: Recent Labs  Lab 07/25/23 1628 07/26/23 0516  AST 286*  --   ALT 731*  --   ALKPHOS 145*  --   BILITOT 2.0*  --   PROT 6.6  --   ALBUMIN 2.8* 2.6*   No results for input(s): "LIPASE", "AMYLASE" in the last 168 hours. No results for input(s): "AMMONIA" in the last 168 hours. CBC: Recent Labs  Lab 07/25/23 1628  WBC 6.1  NEUTROABS 4.8  HGB 11.5*  HCT 35.9*  MCV 92.3  PLT 106*   Cardiac Enzymes: No results for input(s): "CKTOTAL", "CKMB", "CKMBINDEX", "TROPONINI" in the last 168 hours. CBG: No results for input(s): "GLUCAP" in the last 168 hours. Iron Studies: No results for input(s): "IRON", "TIBC", "TRANSFERRIN", "FERRITIN" in the last 72 hours. Studies/Results: DG CHEST PORT 1 VIEW Result Date: 07/25/2023 CLINICAL DATA:  Hypoxia. EXAM: PORTABLE CHEST 1 VIEW COMPARISON:  May 08, 2023 FINDINGS: There is stable mild to moderate severity cardiac silhouette enlargement. Very mild atelectasis and/or infiltrate is suspected along the  periphery of the mid to upper left lung. No pleural effusion or pneumothorax is identified. The visualized skeletal structures are unremarkable. IMPRESSION: Stable cardiomegaly with very mild atelectasis and/or infiltrate along the periphery of the mid to upper left lung. Electronically Signed  By: Virgle Grime M.D.   On: 07/25/2023 21:36    ROS: Pertinent items are noted in HPI. Physical Exam: Vitals:   07/26/23 0434 07/26/23 0510 07/26/23 0600 07/26/23 0744  BP:  (!) 142/84 131/78   Pulse:   94 93  Resp:      Temp:    97.9 F (36.6 C)  TempSrc:    Oral  SpO2:   98% 99%  Weight: 69.5 kg     Height:          Weight change:   Intake/Output Summary (Last 24 hours) at 07/26/2023 1002 Last data filed at 07/26/2023 0423 Gross per 24 hour  Intake 414.68 ml  Output --  Net 414.68 ml   BP 131/78   Pulse 93   Temp 97.9 F (36.6 C) (Oral)   Resp 15   Ht 5\' 7"  (1.702 m)   Wt 69.5 kg   SpO2 99%   BMI 24.00 kg/m  General appearance: alert, cooperative, and no distress Head: Normocephalic, without obvious abnormality, atraumatic Resp: clear to auscultation bilaterally Cardio: regular rate and rhythm, S1, S2 normal, no murmur, click, rub or gallop GI: soft, non-tender; bowel sounds normal; no masses,  no organomegaly Extremities: edema 1+ pretibial edema bilaterally Dialysis Access:  Dialysis Orders: Center: [facility]  on TTS . EDW 68 kg HD Bath 2K/2.5Ca  Time 4:00 Heparin  1000 unit bolus, then 500 units/hr. Access RIJ TDC BFR 350 DFR 500     Assessment/Plan:  Abnormal LFT's - workup per primary svc  ESRD -  will plan for HD today and again tomorrow depending upon the HD census  Hypertension/volume  - UF as tolerated  Anemia  - No ESA, Hgb 11.5  Metabolic bone disease -   continue with home meds  Nutrition -  renal diet  HFrEF - UF with HD as tolerated.  Hyperkalemia - due to missed HD.  Improved and should resolve with IHD.   Benjamin Brands, MD [facility], [facility] 07/26/2023, 10:02 AM

## 2023-07-26 NOTE — Hospital Course (Signed)
 63 y.o. male with a PMH significant for ESRD on HD TThS, cardiomyopathy, tobacco use, HTN, anemia, CHF. At baseline, they live in a motel and are independent for their ADLs.   They presented from home to the ED on 07/25/2023 with rectal pain.  He states that he had several episodes of diarrhea over the past week and due to frequent wiping had pain of his rectum.  Tried to treat with alcohol wipe which caused more pain.  Denies any blood in his stool or with wiping.  He endorses that his diarrhea has resolved at this time. He also endorses missing his last 2 sessions of HD because he "forgot".  He has not taken any of his medications for at least 4 days due to dropping all of them in the toilet on accident and has not requested any refills. He denies any shortness of breath, chest pain. His legs are swollen but he states that they are at their baseline level and have not been worsened recently.    In the ED, it was found that they had stable vital signs while on room air.  Observed him aspirating some of the fluid from his albuterol  nebulizer and subsequently had coughing spell which dropped his O2 sats down to the 80s while coughing and quickly recovered back to high 90s when he was not coughing any longer.  He is alert and oriented x 3. Significant findings included: WBC 6.1, Hgb 11.5, platelets 106.  Na+ 137, K+ 6.2, glucose 144, BUN 130, Cr 17.15, Ca++ 5.8.  Anion gap 27. AST 286, ALT 731, alk phos 145, total bilirubin 2.0.   They were initially treated with albuterol  nebulizer, calcium  gluconate, insulin , sodium bicarb.    Patient was admitted to medicine service for further workup and management of missed HD with fluid overload and hyperkalemia as outlined in detail below.

## 2023-07-26 NOTE — TOC Initial Note (Addendum)
 Transition of Care [facility]) - Initial/Assessment Note    Patient Details  Name: Damon Williams MRN: [id number] Date of Birth: 05/22/1961  Transition of Care [facility]) CM/SW Contact:    Orelia Binet, RN Phone Number: 07/26/2023, 3:07 PM  Clinical Narrative:     Patient admitted with ESRD. CM at the bedside to assess, patient has a high risk for readmission. Patient states he lives with his parents, drives himself to [facility] in [place] for HD on TTHSAT. He is here for a series of HD. Will have HD tomorrow to get back on schedule and depending on labs discharge home. No needs identified. TOC following.               Expected Discharge Plan: Home/Self Care Barriers to Discharge: Continued Medical Work up   Patient Goals and CMS Choice Patient states their goals for this hospitalization and ongoing recovery are:: to go home. CMS Medicare.gov Compare Post Acute Care list provided to:: Patient      Expected Discharge Plan and Services      Living arrangements for the past 2 months: Single Family Home        Prior Living Arrangements/Services Living arrangements for the past 2 months: Single Family Home Lives with:: Parents         Activities of Daily Living   ADL Screening (condition at time of admission) Independently performs ADLs?: Yes (appropriate for developmental age) Is the patient deaf or have difficulty hearing?: No Does the patient have difficulty seeing, even when wearing glasses/contacts?: No Does the patient have difficulty concentrating, remembering, or making decisions?: No  Permission Sought/Granted    Emotional Assessment      Orientation: : Oriented to Self, Oriented to Place, Oriented to  Time, Oriented to Situation Alcohol / Substance Use: Not Applicable Psych Involvement: No (comment)  Admission diagnosis:  Hyperkalemia [E87.5] Metabolic acidosis [E87.20] ESRD (end stage renal disease) (HCC) [N18.6] ESRD (end stage renal disease) on dialysis (HCC)  [N18.6, Z99.2] Patient Active Problem List   Diagnosis Date Noted   Acute hyperkalemia 07/25/2023   ESRD (end stage renal disease) (HCC) 07/25/2023   Pulmonary edema 02/06/2023   Pleural effusion on right 02/06/2023   CHF exacerbation (HCC) 01/28/2023   Acute on chronic combined systolic and diastolic CHF (congestive heart failure) (HCC) 01/28/2023   Acute respiratory failure with hypoxia (HCC) 01/27/2023   Uremia 12/05/2022   ESRD on hemodialysis (HCC) 12/05/2022   AKI (acute kidney injury) (HCC) 12/04/2022   Hyperkalemia 12/04/2022   Metabolic acidosis 12/04/2022   Cardiomyopathy (HCC) 11/05/2022   Acute renal failure (ARF) (HCC) 11/04/2022   Cardiomegaly 11/04/2022   Uncontrolled hypertension 11/04/2022   Anemia in chronic kidney disease (CKD) 11/04/2022   CKD (chronic kidney disease) 11/04/2022   Current smoker 11/04/2022   PCP:  Brantley Caldwell, NP Pharmacy:   [facility] [phone] - [place], [place] - [address] AT [facility] [address] [place] [address] Phone: [phone] Fax: [phone]   Social Drivers of Health (SDOH) Social History: SDOH Screenings   Food Insecurity: No Food Insecurity (07/25/2023)  Housing: Low Risk  (07/25/2023)  Transportation Needs: No Transportation Needs (07/25/2023)  Utilities: Not At Risk (07/25/2023)  Tobacco Use: High Risk (07/25/2023)   SDOH Interventions:    Readmission Risk Interventions    07/26/2023    3:06 PM 02/06/2023    7:57 AM 12/05/2022   10:54 AM  Readmission Risk Prevention Plan  Transportation Screening Complete Complete Complete  Home Care Screening  Complete  Medication Review (RN CM)   Complete  HRI or Home Care Consult  Complete   Social Work Consult for Recovery Care Planning/Counseling  Complete   Palliative Care Screening  Not Applicable   Medication Review Ocean[employer]) Complete Complete   PCP or Specialist appointment within 3-5 days of discharge Not Complete    HRI or Home  Care Consult Complete    SW Recovery Care/Counseling Consult Complete    Palliative Care Screening Not Applicable    Skilled Nursing Facility Not Applicable

## 2023-07-27 ENCOUNTER — Telehealth: Payer: Self-pay | Admitting: Gastroenterology

## 2023-07-27 DIAGNOSIS — F1990 Other psychoactive substance use, unspecified, uncomplicated: Secondary | ICD-10-CM | POA: Diagnosis not present

## 2023-07-27 DIAGNOSIS — E875 Hyperkalemia: Secondary | ICD-10-CM | POA: Diagnosis not present

## 2023-07-27 DIAGNOSIS — R7989 Other specified abnormal findings of blood chemistry: Secondary | ICD-10-CM | POA: Diagnosis not present

## 2023-07-27 DIAGNOSIS — N186 End stage renal disease: Secondary | ICD-10-CM | POA: Diagnosis not present

## 2023-07-27 LAB — COMPREHENSIVE METABOLIC PANEL
ALT: 464 U/L — ABNORMAL HIGH (ref 0–44)
AST: 163 U/L — ABNORMAL HIGH (ref 15–41)
Albumin: 2.1 g/dL — ABNORMAL LOW (ref 3.5–5.0)
Alkaline Phosphatase: 166 U/L — ABNORMAL HIGH (ref 38–126)
Anion gap: 15 (ref 5–15)
BUN: 78 mg/dL — ABNORMAL HIGH (ref 6–20)
CO2: 23 mmol/L (ref 22–32)
Calcium: 6.3 mg/dL — CL (ref 8.9–10.3)
Chloride: 95 mmol/L — ABNORMAL LOW (ref 98–111)
Creatinine, Ser: 11.6 mg/dL — ABNORMAL HIGH (ref 0.61–1.24)
GFR, Estimated: 5 mL/min — ABNORMAL LOW (ref >60)
Glucose, Bld: 115 mg/dL — ABNORMAL HIGH (ref 70–99)
Potassium: 3.6 mmol/L (ref 3.5–5.1)
Sodium: 133 mmol/L — ABNORMAL LOW (ref 135–145)
Total Bilirubin: 1.4 mg/dL — ABNORMAL HIGH (ref 0.0–1.2)
Total Protein: 5.3 g/dL — ABNORMAL LOW (ref 6.5–8.1)

## 2023-07-27 LAB — HEPATITIS B SURFACE ANTIBODY, QUANTITATIVE: Hep B S AB Quant (Post): <3.5 m[IU]/mL — ABNORMAL LOW

## 2023-07-27 LAB — PROTIME-INR
INR: 1.6 — ABNORMAL HIGH (ref 0.8–1.2)
Prothrombin Time: 19.6 s — ABNORMAL HIGH (ref 11.4–15.2)

## 2023-07-27 NOTE — Telephone Encounter (Signed)
Patient needs hospital follow up for elevated LFTs. He has not been seen by any of [place] so he can see any provider. Recommend OV in about one week.

## 2023-07-27 NOTE — Discharge Summary (Signed)
Physician Discharge Summary  Damon Williams:[id number] DOB: 01-14-1961 DOA: 07/25/2023  PCP: Kara Pacer, NP  Admit date: 07/25/2023 Discharge date: 07/27/2023  Admitted From:  HOME  Disposition:  HOME   Recommendations for Outpatient Follow-up:  Follow up with PCP in 1 weeks Go to regular HD treatment on TTS as scheduled outpatient  Please follow up with [facility] in 1 week regarding LFTs  Please check LFTs in 1 week to follow up transient elevation   Discharge Condition: STABLE   CODE STATUS: FULL DIET: resume renal diet with fluid restriction   Brief Hospitalization Summary: Please see all hospital notes, images, labs for full details of the hospitalization. Admission provider HPI:  63 y.o. male with a PMH significant for ESRD on HD TThS, cardiomyopathy, tobacco use, HTN, anemia, CHF. At baseline, they live in a motel and are independent for their ADLs.   They presented from home to the ED on 07/25/2023 with rectal pain.  He states that he had several episodes of diarrhea over the past week and due to frequent wiping had pain of his rectum.  Tried to treat with alcohol wipe which caused more pain.  Denies any blood in his stool or with wiping.  He endorses that his diarrhea has resolved at this time. He also endorses missing his last 2 sessions of HD because he "forgot".  He has not taken any of his medications for at least 4 days due to dropping all of them in the toilet on accident and has not requested any refills. He denies any shortness of breath, chest pain. His legs are swollen but he states that they are at their baseline level and have not been worsened recently.    In the ED, it was found that they had stable vital signs while on room air.  Observed him aspirating some of the fluid from his albuterol nebulizer and subsequently had coughing spell which dropped his O2 sats down to the 80s while coughing and quickly recovered back to high 90s when he was  not coughing any longer.  He is alert and oriented x 3. Significant findings included: WBC 6.1, Hgb 11.5, platelets 106.  Na+ 137, K+ 6.2, glucose 144, BUN 130, Cr 17.15, Ca++ 5.8.  Anion gap 27. AST 286, ALT 731, alk phos 145, total bilirubin 2.0.   They were initially treated with albuterol nebulizer, calcium gluconate, insulin, sodium bicarb.    Patient was admitted to medicine service for further workup and management of missed HD with fluid overload and hyperkalemia as outlined in detail below.  Hospital Course by problem list   Hyperkalemia-resolved ESRD-routine dialysis TThS but has missed his last 2 sessions.   -nephrology planning HD treatments on 1/15 and 1/16 and to resume regular HD schedule at DC   -Continuous cardiac telemetry while in hospital and was stable    HTN-pressures mildly elevated on presentation and he admits to not taking his medications for at least 4 days. 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  Hemoglobin stable at baseline.  11.5 on presentation. -H/H has been stable.    Tobacco user -Nicotine replacement as needed   HFrEF-last echo 11/04/2022 EF 30 to 35%.  Acutely exacerbated with pitting edema to lower extremities.  He states this is his baseline level of edema.  Not significant congestion on chest x-ray. -Volume treatment with HD given on 1/15 and 1/16 -Continue home medications -Strict I's/O   Elevated LFTs/Transaminitis-significantly elevated AST, ALT, alk phos  now trending down  -reported recent diarrheal illness prior to arrival -Checking right upper quadrant ultrasound - no gallstones or ductal dilatation.  -check PT/INR and now trending down and LFTs trending down -discussed with [facility]: ok to follow up outpatient in 1 week for repeat LFTs at [facility] -Acute hepatitis panel negative  -pt strongly advised to stop all recreational drug use   Discharge Diagnoses:  Principal Problem:   ESRD (end  stage renal disease) (HCC) Active Problems:   Acute hyperkalemia  Discharge Instructions: Discharge Instructions     Ambulatory referral to Gastroenterology   Complete by: As directed    What is the reason for referral?: Other      Allergies as of 07/27/2023   No Known Allergies      Medication List     TAKE these medications    amLODipine 5 MG tablet Commonly known as: NORVASC Take 5 mg by mouth daily.   calcium acetate 667 MG capsule Commonly known as: PHOSLO Take 667 mg by mouth 3 (three) times daily.   furosemide 40 MG tablet Commonly known as: LASIX Take 1 tablet (40 mg total) by mouth See admin instructions. Take 40 mg 4 times a week on non-dialysis days PRN for swelling,SOB, weight gain   hydrALAZINE 25 MG tablet Commonly known as: APRESOLINE Take 1 tablet (25 mg total) by mouth 3 (three) times daily.   isosorbide mononitrate 30 MG 24 hr tablet Commonly known as: IMDUR Take 0.5 tablets (15 mg total) by mouth daily.   telmisartan 80 MG tablet Commonly known as: MICARDIS Take 80 mg by mouth daily.        No Known Allergies Allergies as of 07/27/2023   No Known Allergies      Medication List     TAKE these medications    amLODipine 5 MG tablet Commonly known as: NORVASC Take 5 mg by mouth daily.   calcium acetate 667 MG capsule Commonly known as: PHOSLO Take 667 mg by mouth 3 (three) times daily.   furosemide 40 MG tablet Commonly known as: LASIX Take 1 tablet (40 mg total) by mouth See admin instructions. Take 40 mg 4 times a week on non-dialysis days PRN for swelling,SOB, weight gain   hydrALAZINE 25 MG tablet Commonly known as: APRESOLINE Take 1 tablet (25 mg total) by mouth 3 (three) times daily.   isosorbide mononitrate 30 MG 24 hr tablet Commonly known as: IMDUR Take 0.5 tablets (15 mg total) by mouth daily.   telmisartan 80 MG tablet Commonly known as: MICARDIS Take 80 mg by mouth daily.        Procedures/Studies: US  Abdomen Limited RUQ (LIVER/GB) Result Date: 07/26/2023 CLINICAL DATA:  Elevated liver function tests. EXAM: ULTRASOUND ABDOMEN LIMITED RIGHT UPPER QUADRANT COMPARISON:  CT 12/04/2022. renal ultrasound 11/04/2022. FINDINGS: Gallbladder: Gallbladder is distended. No shadowing stones. Mild gallbladder wall thickening of 4 mm. No adjacent fluid. No reported sonographic Murphy's sign. Common bile duct: Diameter: 5 mm Liver: No focal lesion identified. Within normal limits in parenchymal echogenicity. Portal vein is patent on color Doppler imaging with normal direction of blood flow towards the liver. Other: Mild ascites.  Right sided pleural effusion IMPRESSION: No gallstones or ductal dilatation. Mild gallbladder wall thickening, nonspecific. There is mild ascites and right pleural effusion. Electronically Signed   By: Karen Kays M.D.   On: 07/26/2023 18:34   DG CHEST PORT 1 VIEW Result Date: 07/25/2023 CLINICAL DATA:  Hypoxia. EXAM: PORTABLE CHEST 1 VIEW COMPARISON:  May 08, 2023 FINDINGS: There is stable mild to moderate severity cardiac silhouette enlargement. Very mild atelectasis and/or infiltrate is suspected along the periphery of the mid to upper left lung. No pleural effusion or pneumothorax is identified. The visualized skeletal structures are unremarkable. IMPRESSION: Stable cardiomegaly with very mild atelectasis and/or infiltrate along the periphery of the mid to upper left lung. Electronically Signed   By: Aram Candela M.D.   On: 07/25/2023 21:36   DG Wrist Complete Left Result Date: 07/13/2023 CLINICAL DATA:  Hallucinations.  Wrist pain. EXAM: LEFT WRIST - COMPLETE 4 VIEW COMPARISON:  Left wrist radiograph dated 01/01/2018 FINDINGS: There is no evidence of fracture or dislocation. Lobulated ossification along the ulnar aspect of the distal ulna, at the site of prior fracture fragments. Soft tissues are unremarkable. IMPRESSION: No acute fracture or dislocation. Electronically Signed   By:  Agustin Cree M.D.   On: 07/13/2023 14:51   DG Hand Complete Right Result Date: 07/13/2023 CLINICAL DATA:  Hallucinations.  Hand pain. EXAM: RIGHT HAND - COMPLETE 3 VIEW COMPARISON:  None Available. FINDINGS: There is no evidence of fracture or dislocation. Old fracture deformity of fourth metacarpal shaft. Soft tissues are unremarkable. IMPRESSION: 1. No acute fracture or dislocation. 2. Old fracture deformity of fourth metacarpal shaft. Electronically Signed   By: Agustin Cree M.D.   On: 07/13/2023 14:48     Subjective: Pt says he is feeling well today, no abdominal pain, he is adamant he will stop missing dialysis treatments.  No SOB symptoms.  He says he will stop using recreational drugs.   Discharge Exam: Vitals:   07/27/23 1300 07/27/23 1327  BP: (!) 142/88 133/87  Pulse: 91 87  Resp: 18 18  Temp:  98.4 F (36.9 C)  SpO2:  98%   Vitals:   07/27/23 1230 07/27/23 1300 07/27/23 1323 07/27/23 1327  BP: (!) 134/90 (!) 142/88  133/87  Pulse: 92 91  87  Resp: 18 18  18   Temp:    98.4 F (36.9 C)  TempSrc:    Oral  SpO2:    98%  Weight:   64.2 kg   Height:        General: Pt is alert, awake, not in acute distress Cardiovascular: RRR, S1/S2 +, no rubs, no gallops Respiratory: CTA bilaterally, no wheezing, no rhonchi Abdominal: Soft, NT, ND, bowel sounds + Extremities: no edema, no cyanosis   The results of significant diagnostics from this hospitalization (including imaging, microbiology, ancillary and laboratory) are listed below for reference.     Microbiology: Recent Results (from the past 240 hours)  MRSA Next Gen by PCR, Nasal     Status: None   Collection Time: 07/25/23 11:44 PM   Specimen: Nasal Mucosa; Nasal Swab  Result Value Ref Range Status   MRSA by PCR Next Gen NOT DETECTED NOT DETECTED Final    Comment: (NOTE) The GeneXpert MRSA Assay (FDA approved for NASAL specimens only), is one component of a comprehensive MRSA colonization surveillance program. It is not  intended to diagnose MRSA infection nor to guide or monitor treatment for MRSA infections. Test performance is not FDA approved in patients less than 53 years old. Performed at [facility], [address] [place], [place] [address]      Labs: BNP (last 3 results) Recent Labs    12/04/22 1800 01/27/23 2010 02/11/23 1124  BNP 1,433.0* 1,945.0* 2,287.0*   Basic Metabolic Panel: Recent Labs  Lab 07/25/23 1628 07/26/23 0516 07/27/23 0416  NA  137 136 133*  K 6.2* 4.9 3.6  CL 98 95* 95*  CO2 12* 16* 23  GLUCOSE 144* 128* 115*  BUN 130* 139* 78*  CREATININE 17.15* 17.40* 11.60*  CALCIUM 5.8* 6.4* 6.3*  PHOS  --  12.9*  --    Liver Function Tests: Recent Labs  Lab 07/25/23 1628 07/26/23 0516 07/26/23 1111 07/27/23 0416  AST 286*  --  196* 163*  ALT 731*  --  616* 464*  ALKPHOS 145*  --  170* 166*  BILITOT 2.0*  --  1.6* 1.4*  PROT 6.6  --  6.4* 5.3*  ALBUMIN 2.8* 2.6* 2.6* 2.1*   No results for input(s): "LIPASE", "AMYLASE" in the last 168 hours. No results for input(s): "AMMONIA" in the last 168 hours. CBC: Recent Labs  Lab 07/25/23 1628  WBC 6.1  NEUTROABS 4.8  HGB 11.5*  HCT 35.9*  MCV 92.3  PLT 106*   Cardiac Enzymes: No results for input(s): "CKTOTAL", "CKMB", "CKMBINDEX", "TROPONINI" in the last 168 hours. BNP: Invalid input(s): "POCBNP" CBG: No results for input(s): "GLUCAP" in the last 168 hours. D-Dimer No results for input(s): "DDIMER" in the last 72 hours. Hgb A1c No results for input(s): "HGBA1C" in the last 72 hours. Lipid Profile No results for input(s): "CHOL", "HDL", "LDLCALC", "TRIG", "CHOLHDL", "LDLDIRECT" in the last 72 hours. Thyroid function studies No results for input(s): "TSH", "T4TOTAL", "T3FREE", "THYROIDAB" in the last 72 hours.  Invalid input(s): "FREET3" Anemia work up No results for input(s): "VITAMINB12", "FOLATE", "FERRITIN", "TIBC", "IRON", "RETICCTPCT" in the last 72 hours. Urinalysis    Component Value Date/Time    COLORURINE STRAW (A) 11/04/2022 1226   APPEARANCEUR CLEAR 11/04/2022 1226   LABSPEC 1.011 11/04/2022 1226   PHURINE 5.0 11/04/2022 1226   GLUCOSEU NEGATIVE 11/04/2022 1226   HGBUR SMALL (A) 11/04/2022 1226   BILIRUBINUR NEGATIVE 11/04/2022 1226   KETONESUR NEGATIVE 11/04/2022 1226   PROTEINUR 100 (A) 11/04/2022 1226   NITRITE NEGATIVE 11/04/2022 1226   LEUKOCYTESUR NEGATIVE 11/04/2022 1226   Sepsis Labs Recent Labs  Lab 07/25/23 1628  WBC 6.1   Microbiology Recent Results (from the past 240 hours)  MRSA Next Gen by PCR, Nasal     Status: None   Collection Time: 07/25/23 11:44 PM   Specimen: Nasal Mucosa; Nasal Swab  Result Value Ref Range Status   MRSA by PCR Next Gen NOT DETECTED NOT DETECTED Final    Comment: (NOTE) The GeneXpert MRSA Assay (FDA approved for NASAL specimens only), is one component of a comprehensive MRSA colonization surveillance program. It is not intended to diagnose MRSA infection nor to guide or monitor treatment for MRSA infections. Test performance is not FDA approved in patients less than 65 years old. Performed at [facility], [address] [place], [place] [address]     Time coordinating discharge:  40 mins  SIGNED:  Standley Dakins, MD  [facility] 07/27/2023, 2:55 PM How to contact the [facility] Attending or Consulting provider 7A - 7P or covering provider during after hours 7P -7A, for this patient?  Check the care team in [facility] and look for a) attending/consulting [facility] provider listed and b) the [facility] team listed Log into [email or web address] and use [place]'s universal password to access. If you do not have the password, please contact the hospital operator. Locate the [facility] provider you are looking for under [facility] and page to a number that you can be directly reached. If you still have difficulty reaching the provider, please page  the [facility] (Director on Call) for the Hospitalists listed on amion for assistance.

## 2023-07-27 NOTE — Progress Notes (Signed)
Critical lab value  Calcium 6.3

## 2023-07-27 NOTE — Progress Notes (Signed)
Damon Williams is an 63 y.o. 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  Missed multiple treatments.  Dialysis Orders: Center: [facility]  on TTS . EDW 68 kg HD Bath 2K/2.5Ca  Time 4:00 Heparin 1000 unit bolus, then 500 units/hr. Access RIJ TDC BFR 350 DFR 500     Assessment/Plan:  Abnormal LFT's - workup per primary svc  ESRD -tolerated dialysis on Wednesday for 3-1/2 hours with 3.4 L net UF.  Plan for HD today as well and then back to Saturday for TTS regimen.  Hypertension/volume  - UF as tolerated  Anemia  - No ESA, Hgb 11.5  Metabolic bone disease -   continue with home meds  Nutrition -  renal diet  HFrEF - UF with HD as tolerated.  Hyperkalemia - due to missed HD.  Improved and should resolve with IHD.    Subjective: Patient with good appetite; denies fever chills or any further rectal pain.   Chemistry and CBC: Creatinine, Ser  Date/Time Value Ref Range Status  07/27/2023 04:16 AM 11.60 (H) 0.61 - 1.24 mg/dL Final  98/05/9146 82:95 AM 17.40 (H) 0.61 - 1.24 mg/dL Final  62/13/0865 78:46 PM 17.15 (H) 0.61 - 1.24 mg/dL Final  96/29/5284 13:24 AM 10.50 (H) 0.61 - 1.24 mg/dL Final  40/04/2724 36:64 AM 8.22 (H) 0.61 - 1.24 mg/dL Final  40/34/7425 95:63 AM 14.83 (H) 0.61 - 1.24 mg/dL Final  87/56/4332 95:18 AM 8.68 (H) 0.61 - 1.24 mg/dL Final    Comment:    DELTA CHECK NOTED  02/11/2023 11:24 AM 3.68 (H) 0.61 - 1.24 mg/dL Final  84/16/6063 01:60 AM 12.47 (H) 0.61 - 1.24 mg/dL Final  10/93/2355 73:22 AM 10.26 (H) 0.61 - 1.24 mg/dL Final  02/54/2706 23:76 PM 9.17 (H) 0.61 - 1.24 mg/dL Final  28/31/5176 16:07 AM 10.60 (H) 0.61 - 1.24 mg/dL Final  37/04/6268 48:54 PM 9.88 (H) 0.61 - 1.24 mg/dL Final  62/70/3500 93:81 AM 13.87 (H) 0.61 - 1.24 mg/dL Final  82/99/3716 96:78 AM 14.69 (H) 0.61 - 1.24 mg/dL Final  93/81/0175 10:25 AM 16.86 (H) 0.61 - 1.24 mg/dL Final  85/27/7824 23:53 AM  20.32 (H) 0.61 - 1.24 mg/dL Final  61/44/3154 00:86 PM 19.37 (H) 0.61 - 1.24 mg/dL Final  76/19/5093 26:71 AM 7.16 (H) 0.61 - 1.24 mg/dL Final  24/58/0998 33:82 AM 6.39 (H) 0.61 - 1.24 mg/dL Final  50/53/9767 34:19 AM 6.00 (H) 0.61 - 1.24 mg/dL Final  37/90/2409 73:53 AM 5.75 (H) 0.61 - 1.24 mg/dL Final  29/92/4268 34:19 AM 5.80 (H) 0.61 - 1.24 mg/dL Final  62/22/9798 92:11 PM 1.96 (H) 0.61 - 1.24 mg/dL Final   Recent Labs  Lab 07/25/23 1628 07/26/23 0516 07/27/23 0416  NA 137 136 133*  K 6.2* 4.9 3.6  CL 98 95* 95*  CO2 12* 16* 23  GLUCOSE 144* 128* 115*  BUN 130* 139* 78*  CREATININE 17.15* 17.40* 11.60*  CALCIUM 5.8* 6.4* 6.3*  PHOS  --  12.9*  --    Recent Labs  Lab 07/25/23 1628  WBC 6.1  NEUTROABS 4.8  HGB 11.5*  HCT 35.9*  MCV 92.3  PLT 106*   Liver Function Tests: Recent Labs  Lab 07/25/23 1628 07/26/23 0516 07/26/23 1111 07/27/23 0416  AST 286*  --  196* 163*  ALT 731*  --  616* 464*  ALKPHOS 145*  --  170* 166*  BILITOT 2.0*  --  1.6* 1.4*  PROT 6.6  --  6.4* 5.3*  ALBUMIN 2.8* 2.6* 2.6* 2.1*   No results for input(s): "LIPASE", "AMYLASE" in the last 168 hours. No results for input(s): "AMMONIA" in the last 168 hours. Cardiac Enzymes: No results for input(s): "CKTOTAL", "CKMB", "CKMBINDEX", "TROPONINI" in the last 168 hours. Iron Studies: No results for input(s): "IRON", "TIBC", "TRANSFERRIN", "FERRITIN" in the last 72 hours. PT/INR: @LABRCNTIP (inr:5)  Xrays/Other Studies: ) Results for orders placed or performed during the hospital encounter of 07/25/23 (from the past 48 hours)  CBC with Differential     Status: Abnormal   Collection Time: 07/25/23  4:28 PM  Result Value Ref Range   WBC 6.1 4.0 - 10.5 K/uL   RBC 3.89 (L) 4.22 - 5.81 MIL/uL   Hemoglobin 11.5 (L) 13.0 - 17.0 g/dL   HCT 16.1 (L) 09.6 - 04.5 %   MCV 92.3 80.0 - 100.0 fL   MCH 29.6 26.0 - 34.0 pg   MCHC 32.0 30.0 - 36.0 g/dL   RDW 40.9 (H) 81.1 - 91.4 %   Platelets 106 (L)  150 - 400 K/uL   nRBC 1.3 (H) 0.0 - 0.2 %   Neutrophils Relative % 79 %   Neutro Abs 4.8 1.7 - 7.7 K/uL   Lymphocytes Relative 7 %   Lymphs Abs 0.4 (L) 0.7 - 4.0 K/uL   Monocytes Relative 13 %   Monocytes Absolute 0.8 0.1 - 1.0 K/uL   Eosinophils Relative 0 %   Eosinophils Absolute 0.0 0.0 - 0.5 K/uL   Basophils Relative 0 %   Basophils Absolute 0.0 0.0 - 0.1 K/uL   Immature Granulocytes 1 %   Abs Immature Granulocytes 0.03 0.00 - 0.07 K/uL    Comment: Performed at [facility], [address] [place], [place] [address]  Basic metabolic panel     Status: Abnormal   Collection Time: 07/25/23  4:28 PM  Result Value Ref Range   Sodium 137 135 - 145 mmol/L   Potassium 6.2 (H) 3.5 - 5.1 mmol/L   Chloride 98 98 - 111 mmol/L   CO2 12 (L) 22 - 32 mmol/L   Glucose, Bld 144 (H) 70 - 99 mg/dL    Comment: Glucose reference range applies only to samples taken after fasting for at least 8 hours.   BUN 130 (H) 6 - 20 mg/dL    Comment: RESULTS CONFIRMED BY MANUAL DILUTION   Creatinine, Ser 17.15 (H) 0.61 - 1.24 mg/dL   Calcium 5.8 (LL) 8.9 - 10.3 mg/dL    Comment: CRITICAL RESULT CALLED TO, READ BACK BY AND VERIFIED WITH WHITE,M ON 07/25/23 AT 1850 BY LOY,C   GFR, Estimated 3 (L) >60 mL/min    Comment: (NOTE) Calculated using the CKD-EPI Creatinine Equation (2021)    Anion gap 27 (H) 5 - 15    Comment: Electrolytes repeated to confirm. Electrolytes repeated to confirm. Performed at [facility], [address], [place], [place] [address]   Hepatic function panel     Status: Abnormal   Collection Time: 07/25/23  4:28 PM  Result Value Ref Range   Total Protein 6.6 6.5 - 8.1 g/dL   Albumin 2.8 (L) 3.5 - 5.0 g/dL   AST 865 (H) 15 - 41 U/L   ALT 731 (H) 0 - 44 U/L   Alkaline Phosphatase 145 (H) 38 - 126 U/L   Total Bilirubin 2.0 (H) 0.0 - 1.2 mg/dL   Bilirubin, Direct 1.0 (H) 0.0 - 0.2 mg/dL   Indirect Bilirubin 1.0 (H)  0.3 - 0.9 mg/dL    Comment: Performed at [facility], [address], [place], [place] [address]  Blood gas, venous     Status: Abnormal   Collection Time: 07/25/23  7:27 PM  Result Value Ref Range   pH, Ven 7.23 (L) 7.25 - 7.43   pCO2, Ven 34 (L) 44 - 60 mmHg   pO2, Ven 33 32 - 45 mmHg   Bicarbonate 14.2 (L) 20.0 - 28.0 mmol/L   Acid-base deficit 12.3 (H) 0.0 - 2.0 mmol/L   O2 Saturation 40.8 %   Patient temperature 36.8    Collection site BLOOD RIGHT ARM    Drawn by MM     Comment: Performed at [facility], [address] [place], [place] [address]  MRSA Next Gen by PCR, Nasal     Status: None   Collection Time: 07/25/23 11:44 PM   Specimen: Nasal Mucosa; Nasal Swab  Result Value Ref Range   MRSA by PCR Next Gen NOT DETECTED NOT DETECTED    Comment: (NOTE) The GeneXpert MRSA Assay (FDA approved for NASAL specimens only), is one component of a comprehensive MRSA colonization surveillance program. It is not intended to diagnose MRSA infection nor to guide or monitor treatment for MRSA infections. Test performance is not FDA approved in patients less than 67 years old. Performed at [facility], [address], [place], [place] [address]   Hepatitis B surface antigen     Status: None   Collection Time: 07/26/23  5:16 AM  Result Value Ref Range   Hepatitis B Surface Ag NON REACTIVE NON REACTIVE    Comment: Performed at [facility], [address] [place], [place] [address]  Renal function panel     Status: Abnormal   Collection Time: 07/26/23  5:16 AM  Result Value Ref Range   Sodium 136 135 - 145 mmol/L   Potassium 4.9 3.5 - 5.1 mmol/L    Comment: DELTA CHECK NOTED   Chloride 95 (L) 98 - 111 mmol/L   CO2 16 (L) 22 - 32 mmol/L   Glucose, Bld 128 (H) 70 - 99 mg/dL    Comment: Glucose reference range applies only to samples taken after fasting for at least 8 hours.   BUN 139 (H) 6 - 20 mg/dL    Comment: RESULT CONFIRMED BY MANUAL DILUTION   Creatinine, Ser 17.40 (H) 0.61 - 1.24 mg/dL   Calcium 6.4 (LL) 8.9 - 10.3 mg/dL    Comment:  CRITICAL RESULT CALLED TO, READ BACK BY AND VERIFIED WITH ROSE,C AT 6:20AM ON 07/26/23 BY FESTERMAN,C   Phosphorus 12.9 (H) 2.5 - 4.6 mg/dL    Comment: RESULT CONFIRMED BY MANUAL DILUTION   Albumin 2.6 (L) 3.5 - 5.0 g/dL   GFR, Estimated 3 (L) >60 mL/min    Comment: (NOTE) Calculated using the CKD-EPI Creatinine Equation (2021)    Anion gap 25 (H) 5 - 15    Comment: ELECTROLYTES REPEATED TO VERIFY Performed at [facility], [address] [place], [place] [address]   Hepatic function panel     Status: Abnormal   Collection Time: 07/26/23 11:11 AM  Result Value Ref Range   Total Protein 6.4 (L) 6.5 - 8.1 g/dL   Albumin 2.6 (L) 3.5 - 5.0 g/dL   AST 130 (H) 15 - 41 U/L   ALT 616 (H) 0 - 44 U/L   Alkaline Phosphatase 170 (H) 38 - 126 U/L   Total Bilirubin 1.6 (H) 0.0 - 1.2 mg/dL  Bilirubin, Direct 0.7 (H) 0.0 - 0.2 mg/dL   Indirect Bilirubin 0.9 0.3 - 0.9 mg/dL    Comment: Performed at [facility], [address] [place], [place] [address]  Protime-INR     Status: Abnormal   Collection Time: 07/26/23 11:11 AM  Result Value Ref Range   Prothrombin Time 21.4 (H) 11.4 - 15.2 seconds   INR 1.8 (H) 0.8 - 1.2    Comment: (NOTE) INR goal varies based on device and disease states. Performed at [facility], [address], [place], [place] [address]   Hepatitis panel, acute     Status: None   Collection Time: 07/26/23 11:11 AM  Result Value Ref Range   Hepatitis B Surface Ag NON REACTIVE NON REACTIVE   HCV Ab NON REACTIVE NON REACTIVE    Comment: (NOTE) Nonreactive HCV antibody screen is consistent with no HCV infections,  unless recent infection is suspected or other evidence exists to indicate HCV infection.     Hep A IgM NON REACTIVE NON REACTIVE   Hep B C IgM NON REACTIVE NON REACTIVE    Comment: Performed at [facility], [address], [place], [place] [address]  Protime-INR     Status: Abnormal   Collection Time: 07/27/23  4:16 AM  Result Value Ref Range    Prothrombin Time 19.6 (H) 11.4 - 15.2 seconds   INR 1.6 (H) 0.8 - 1.2    Comment: (NOTE) INR goal varies based on device and disease states. Performed at [facility], [address] [place], [place] [address]   Comprehensive metabolic panel     Status: Abnormal   Collection Time: 07/27/23  4:16 AM  Result Value Ref Range   Sodium 133 (L) 135 - 145 mmol/L   Potassium 3.6 3.5 - 5.1 mmol/L    Comment: DELTA CHECK NOTED   Chloride 95 (L) 98 - 111 mmol/L   CO2 23 22 - 32 mmol/L   Glucose, Bld 115 (H) 70 - 99 mg/dL    Comment: Glucose reference range applies only to samples taken after fasting for at least 8 hours.   BUN 78 (H) 6 - 20 mg/dL   Creatinine, Ser 02.72 (H) 0.61 - 1.24 mg/dL   Calcium 6.3 (LL) 8.9 - 10.3 mg/dL    Comment: CRITICAL RESULT CALLED TO, READ BACK BY AND VERIFIED WITH HELBERG,J AT 6:05AM ON 07/27/23 BY FESTERMAN,C   Total Protein 5.3 (L) 6.5 - 8.1 g/dL   Albumin 2.1 (L) 3.5 - 5.0 g/dL   AST 536 (H) 15 - 41 U/L   ALT 464 (H) 0 - 44 U/L   Alkaline Phosphatase 166 (H) 38 - 126 U/L   Total Bilirubin 1.4 (H) 0.0 - 1.2 mg/dL   GFR, Estimated 5 (L) >60 mL/min    Comment: (NOTE) Calculated using the CKD-EPI Creatinine Equation (2021)    Anion gap 15 5 - 15    Comment: Performed at [facility], [address] [place], [place] [address]   US Abdomen Limited RUQ (LIVER/GB) Result Date: 07/26/2023 CLINICAL DATA:  Elevated liver function tests. EXAM: ULTRASOUND ABDOMEN LIMITED RIGHT UPPER QUADRANT COMPARISON:  CT 12/04/2022. renal ultrasound 11/04/2022. FINDINGS: Gallbladder: Gallbladder is distended. No shadowing stones. Mild gallbladder wall thickening of 4 mm. No adjacent fluid. No reported sonographic Murphy's sign. Common bile duct: Diameter: 5 mm Liver: No focal lesion identified. Within normal limits in parenchymal echogenicity. Portal vein is patent on color Doppler imaging with normal direction of blood flow towards the liver.  Other: Mild ascites.  Right sided pleural  effusion IMPRESSION: No gallstones or ductal dilatation. Mild gallbladder wall thickening, nonspecific. There is mild ascites and right pleural effusion. Electronically Signed   By: Karen Kays M.D.   On: 07/26/2023 18:34   DG CHEST PORT 1 VIEW Result Date: 07/25/2023 CLINICAL DATA:  Hypoxia. EXAM: PORTABLE CHEST 1 VIEW COMPARISON:  May 08, 2023 FINDINGS: There is stable mild to moderate severity cardiac silhouette enlargement. Very mild atelectasis and/or infiltrate is suspected along the periphery of the mid to upper left lung. No pleural effusion or pneumothorax is identified. The visualized skeletal structures are unremarkable. IMPRESSION: Stable cardiomegaly with very mild atelectasis and/or infiltrate along the periphery of the mid to upper left lung. Electronically Signed   By: Aram Candela M.D.   On: 07/25/2023 21:36    PMH:   Past Medical History:  Diagnosis Date   Cardiomyopathy (HCC)    CKD    Cocaine use    ESRD (end stage renal disease) on dialysis (HCC) 12/05/2022   Hypertension    Tobacco abuse     PSH:   Past Surgical History:  Procedure Laterality Date   AV FISTULA PLACEMENT Left 12/27/2022   Procedure: INSERTION OF LEFT ARM  ARTERIOVENOUS FISTULA;  Surgeon: Larina Earthly, MD;  Location: [facility];  Service: Vascular;  Laterality: Left;   BACK SURGERY     IR FLUORO GUIDE CV LINE RIGHT  12/07/2022   IR US GUIDE VASC ACCESS RIGHT  12/07/2022    Allergies: No Known Allergies  Medications:   Prior to Admission medications   Medication Sig Start Date End Date Taking? Authorizing Provider  amLODipine (NORVASC) 5 MG tablet Take 5 mg by mouth daily. 07/20/23  Yes [provider]  calcium acetate (PHOSLO) 667 MG capsule Take 667 mg by mouth 3 (three) times daily. 07/20/23  Yes [provider]  furosemide (LASIX) 40 MG tablet Take 1 tablet (40 mg total) by mouth See admin instructions. Take 40 mg 4 times a week on non-dialysis days PRN for swelling,SOB,  weight gain 03/15/23  Yes Sharlene Dory, NP  hydrALAZINE (APRESOLINE) 25 MG tablet Take 1 tablet (25 mg total) by mouth 3 (three) times daily. 02/07/23  Yes Shon Hale, MD  isosorbide mononitrate (IMDUR) 30 MG 24 hr tablet Take 0.5 tablets (15 mg total) by mouth daily. 02/07/23  Yes Emokpae, Courage, MD  telmisartan (MICARDIS) 80 MG tablet Take 80 mg by mouth daily. 06/26/23  Yes [provider]    Discontinued Meds:   Medications Discontinued During This Encounter  Medication Reason   amLODipine (NORVASC) 10 MG tablet Dose change   carvedilol (COREG) 12.5 MG tablet Dose change   traZODone (DESYREL) 100 MG tablet Patient Preference   carvedilol (COREG) 6.25 MG tablet Discontinued by provider   lidocaine-prilocaine (EMLA) cream Patient Preference   mupirocin ointment (BACTROBAN) 2 % Patient Preference   calcium gluconate 2 g/ 100 mL sodium chloride IVPB Duplicate   sodium zirconium cyclosilicate (LOKELMA) packet 10 g     Social History:  reports that he has been smoking cigarettes. He has a 30 pack-year smoking history. He quit smokeless tobacco use about 10 months ago. He reports that he does not currently use alcohol. He reports current drug use. Drug: Cocaine.  Family History:   Family History  Problem Relation Age of Onset   Heart disease Neg Hx     Blood pressure 121/86, pulse 94, temperature 98.1 F (36.7 C), temperature source Oral,  resp. rate 16, height 5\' 7"  (1.702 m), weight 71.1 kg, SpO2 97%. Physical Exam: General appearance: NAD, pleasant Head: NCAT Resp: clear to auscultation bilaterally Cardio: regular rate and rhythm GI: soft, non-tender; bowel sounds normal; no masses,  no organomegaly Extremities: edema 1+ pretibial edema bilaterally Dialysis Access: Good bruit in AVF     Ethelene Hal, MD 07/27/2023, 9:34 AM

## 2023-07-27 NOTE — Progress Notes (Signed)
System clotted at last 10 mins. Pt goal met.   07/27/23 1327  Vitals  Temp 98.4 F (36.9 C)  Temp Source Oral  BP 133/87  BP Location Right Arm  BP Method Automatic  Patient Position (if appropriate) Lying  Pulse Rate 87  Resp 18  Oxygen Therapy  SpO2 98 %  O2 Device Room Air  During Treatment Monitoring  Intra-Hemodialysis Comments Tx completed  Post Treatment  Dialyzer Clearance Clotted  Hemodialysis Intake (mL) 0 mL  Liters Processed 50  Fluid Removed (mL) 2800 mL  Tolerated HD Treatment Yes  Post-Hemodialysis Comments Pt goal met.  AVG/AVF Arterial Site Held (minutes) 10 minutes  AVG/AVF Venous Site Held (minutes) 10 minutes  Fistula / Graft Left Upper arm Arteriovenous fistula  Placement Date/Time: 12/27/22 0835   Placed prior to admission: No  Orientation: Left  Access Location: Upper arm  Access Type: Arteriovenous fistula  Site Condition No complications  Fistula / Graft Assessment Present;Thrill;Bruit  Status Deaccessed  Needle Size 15  Drainage Description None

## 2023-07-27 NOTE — Progress Notes (Signed)
Alert and oriented this morning. Transported to dialysis this morning.  BP meds not given . IV removed and DC instructions reviewed with no new medications.  Drove self to ED for admission and was transported to ED entrance by secretary.

## 2023-07-27 NOTE — Consult Note (Signed)
GI consult for acute hepatitis with transaminitis. Spoke with Dr. Laural Benes, patient to be discharged after dialysis. LFTs and INR are trending downward. Acute hepatitis panel negative. LFTs had been normal two weeks ago. RUQ U/S with no gallstones or biliary dilation. Some nonspecific mild gb wall thickening but negative murphy's sign. Etiology unclear. UDS pending, history of cocaine use in past. Presenting with diarrhea, no dialysis in five days.   Recommending outpatient follow up for elevated LFTs, can trend to normal and if persist, then further work up as indicated. Our office to arrange follow up.   Leanna Battles. Dixon Boos [facility] [phone] 1/16/202510:33 AM

## 2023-07-29 DIAGNOSIS — N186 End stage renal disease: Secondary | ICD-10-CM | POA: Diagnosis not present

## 2023-07-29 DIAGNOSIS — Z992 Dependence on renal dialysis: Secondary | ICD-10-CM | POA: Diagnosis not present

## 2023-08-01 DIAGNOSIS — Z992 Dependence on renal dialysis: Secondary | ICD-10-CM | POA: Diagnosis not present

## 2023-08-01 DIAGNOSIS — N186 End stage renal disease: Secondary | ICD-10-CM | POA: Diagnosis not present

## 2023-08-01 DIAGNOSIS — Z131 Encounter for screening for diabetes mellitus: Secondary | ICD-10-CM | POA: Diagnosis not present

## 2023-08-01 NOTE — Progress Notes (Deleted)
 Referring Provider:*** Primary Care Physician:  Kara Pacer, NP Primary Gastroenterologist:  Dr. Bonnetta Barry chief complaint on file.   HPI:   Damon Williams is a 63 y.o. male with a PMH significant for ESRD on HD TThS, cardiomyopathy, tobacco use, HTN, anemia, CHF  presenting today at the request of Dr. Laural Benes for elevated LFTs.   Patient was admitted to the hospital 1/14-1/16/25 after presenting with diarrhea over the last week with associated rectal pain due to frequent wiping without BRBPR or melena.  By the time he presented to the hospital, his diarrhea had resolved.  Noted he had missed 2 sessions of hemodialysis and had not taken any of his medications in 4 days due to dropping them in the toilet.  He was noted to have elevated liver enzymes with AST 286, ALT 731, alk phos 145, total bilirubin 2.0, potassium elevated at 6.2.  He was ultimately admitted for further workup and management of missed hemodialysis, fluid overload, hyperkalemia.  He underwent dialysis x 2 with improvement in electrolytes and volume status.  Regarding LFT elevation, he had right upper quadrant ultrasound that showed no gallstones or ductal dilation, normal liver, mild ascites and R pleural effusion.  Acute hepatitis panel was negative.  INR was elevated at 1.8.  Labs were trended with improving LFTs and INR.  At discharge, AST 163, ALT 464, alk phos 166, T. bili 1.4, INR 1.6.  Dr. Laural Benes discussed with GI who recommended outpatient follow-up.  Today:     Past Medical History:  Diagnosis Date   Cardiomyopathy (HCC)    CKD    Cocaine use    ESRD (end stage renal disease) on dialysis (HCC) 12/05/2022   Hypertension    Tobacco abuse     Past Surgical History:  Procedure Laterality Date   AV FISTULA PLACEMENT Left 12/27/2022   Procedure: INSERTION OF LEFT ARM  ARTERIOVENOUS FISTULA;  Surgeon: Larina Earthly, MD;  Location: [facility];  Service: Vascular;  Laterality: Left;   BACK SURGERY     IR  FLUORO GUIDE CV LINE RIGHT  12/07/2022   IR US GUIDE VASC ACCESS RIGHT  12/07/2022    Current Outpatient Medications  Medication Sig Dispense Refill   amLODipine (NORVASC) 5 MG tablet Take 5 mg by mouth daily.     calcium acetate (PHOSLO) 667 MG capsule Take 667 mg by mouth 3 (three) times daily.     furosemide (LASIX) 40 MG tablet Take 1 tablet (40 mg total) by mouth See admin instructions. Take 40 mg 4 times a week on non-dialysis days PRN for swelling,SOB, weight gain 30 tablet 5   hydrALAZINE (APRESOLINE) 25 MG tablet Take 1 tablet (25 mg total) by mouth 3 (three) times daily. 90 tablet 3   isosorbide mononitrate (IMDUR) 30 MG 24 hr tablet Take 0.5 tablets (15 mg total) by mouth daily. 15 tablet 5   telmisartan (MICARDIS) 80 MG tablet Take 80 mg by mouth daily.     No current facility-administered medications for this visit.    Allergies as of 08/03/2023   (No Known Allergies)    Family History  Problem Relation Age of Onset   Heart disease Neg Hx     Social History   Socioeconomic History   Marital status: Married    Spouse name: Not on file   Number of children: Not on file   Years of education: Not on file   Highest education level: Not on file  Occupational History  Not on file  Tobacco Use   Smoking status: Every Day    Current packs/day: 1.00    Average packs/day: 1 pack/day for 30.0 years (30.0 ttl pk-yrs)    Types: Cigarettes   Smokeless tobacco: Former    Quit date: 09/2022  Vaping Use   Vaping status: Never Used  Substance and Sexual Activity   Alcohol use: Not Currently    Comment: quit 1989   Drug use: Yes    Types: Cocaine   Sexual activity: Yes    Birth control/protection: None  Other Topics Concern   Not on file  Social History Narrative   Not on file   Social Drivers of Health   Financial Resource Strain: Not on file  Food Insecurity: No Food Insecurity (07/25/2023)   Hunger Vital Sign    Worried About Running Out of Food in the Last  Year: Never true    Ran Out of Food in the Last Year: Never true  Transportation Needs: No Transportation Needs (07/25/2023)   PRAPARE - Administrator, Civil Service (Medical): No    Lack of Transportation (Non-Medical): No  Physical Activity: Not on file  Stress: Not on file  Social Connections: Not on file  Intimate Partner Violence: Not At Risk (07/25/2023)   Humiliation, Afraid, Rape, and Kick questionnaire    Fear of Current or Ex-Partner: No    Emotionally Abused: No    Physically Abused: No    Sexually Abused: No    Review of Systems: Gen: Denies any fever, chills, fatigue, weight loss, lack of appetite.  CV: Denies chest pain, heart palpitations, peripheral edema, syncope.  Resp: Denies shortness of breath at rest or with exertion. Denies wheezing or cough.  GI: Denies dysphagia or odynophagia. Denies jaundice, hematemesis, fecal incontinence. GU : Denies urinary burning, urinary frequency, urinary hesitancy MS: Denies joint pain, muscle weakness, cramps, or limitation of movement.  Derm: Denies rash, itching, dry skin Psych: Denies depression, anxiety, memory loss, and confusion Heme: Denies bruising, bleeding, and enlarged lymph nodes.  Physical Exam: There were no vitals taken for this visit. General:   Alert and oriented. Pleasant and cooperative. Well-nourished and well-developed.  Head:  Normocephalic and atraumatic. Eyes:  Without icterus, sclera clear and conjunctiva pink.  Ears:  Normal auditory acuity. Lungs:  Clear to auscultation bilaterally. No wheezes, rales, or rhonchi. No distress.  Heart:  S1, S2 present without murmurs appreciated.  Abdomen:  +BS, soft, non-tender and non-distended. No HSM noted. No guarding or rebound. No masses appreciated.  Rectal:  Deferred  Msk:  Symmetrical without gross deformities. Normal posture. Extremities:  Without edema. Neurologic:  Alert and  oriented x4;  grossly normal neurologically. Skin:  Intact without  significant lesions or rashes. Psych:  Alert and cooperative. Normal mood and affect.    Assessment:     Plan:  ***   Ermalinda Memos, PA-C [facility] 08/03/2023

## 2023-08-03 ENCOUNTER — Encounter: Payer: Self-pay | Admitting: Gastroenterology

## 2023-08-03 ENCOUNTER — Inpatient Hospital Stay: Payer: 59 | Admitting: Gastroenterology

## 2023-08-03 DIAGNOSIS — N186 End stage renal disease: Secondary | ICD-10-CM | POA: Diagnosis not present

## 2023-08-03 DIAGNOSIS — Z992 Dependence on renal dialysis: Secondary | ICD-10-CM | POA: Diagnosis not present

## 2023-08-05 DIAGNOSIS — N186 End stage renal disease: Secondary | ICD-10-CM | POA: Diagnosis not present

## 2023-08-05 DIAGNOSIS — Z992 Dependence on renal dialysis: Secondary | ICD-10-CM | POA: Diagnosis not present

## 2023-08-07 ENCOUNTER — Emergency Department (HOSPITAL_COMMUNITY)
Admission: EM | Admit: 2023-08-07 | Discharge: 2023-08-07 | Discharge status: Against Medical Advice | Payer: 59 | Attending: Emergency Medicine | Admitting: Emergency Medicine

## 2023-08-07 ENCOUNTER — Other Ambulatory Visit: Payer: Self-pay

## 2023-08-07 ENCOUNTER — Emergency Department (HOSPITAL_COMMUNITY): Payer: 59

## 2023-08-07 ENCOUNTER — Encounter (HOSPITAL_COMMUNITY): Payer: Self-pay

## 2023-08-07 DIAGNOSIS — N186 End stage renal disease: Secondary | ICD-10-CM | POA: Diagnosis not present

## 2023-08-07 DIAGNOSIS — Z992 Dependence on renal dialysis: Secondary | ICD-10-CM | POA: Insufficient documentation

## 2023-08-07 DIAGNOSIS — R600 Localized edema: Secondary | ICD-10-CM | POA: Insufficient documentation

## 2023-08-07 DIAGNOSIS — R0602 Shortness of breath: Secondary | ICD-10-CM | POA: Diagnosis present

## 2023-08-07 DIAGNOSIS — J984 Other disorders of lung: Secondary | ICD-10-CM | POA: Diagnosis not present

## 2023-08-07 DIAGNOSIS — R918 Other nonspecific abnormal finding of lung field: Secondary | ICD-10-CM | POA: Diagnosis not present

## 2023-08-07 LAB — BASIC METABOLIC PANEL
Anion gap: 20 — ABNORMAL HIGH (ref 5–15)
BUN: 45 mg/dL — ABNORMAL HIGH (ref 6–20)
CO2: 20 mmol/L — ABNORMAL LOW (ref 22–32)
Calcium: 8.1 mg/dL — ABNORMAL LOW (ref 8.9–10.3)
Chloride: 95 mmol/L — ABNORMAL LOW (ref 98–111)
Creatinine, Ser: 8.47 mg/dL — ABNORMAL HIGH (ref 0.61–1.24)
GFR, Estimated: 7 mL/min — ABNORMAL LOW (ref >60)
Glucose, Bld: 77 mg/dL (ref 70–99)
Potassium: 4.6 mmol/L (ref 3.5–5.1)
Sodium: 135 mmol/L (ref 135–145)

## 2023-08-07 LAB — DRUG SCREEN 10 W/CONF, SERUM
Amphetamines, IA: NEGATIVE ng/mL
Barbiturates, IA: NEGATIVE ug/mL
Benzodiazepines, IA: NEGATIVE ng/mL
Cocaine & Metabolite, IA: POSITIVE ng/mL — AB
Methadone, IA: NEGATIVE ng/mL
Opiates, IA: NEGATIVE ng/mL
Oxycodones, IA: NEGATIVE ng/mL
Phencyclidine, IA: NEGATIVE ng/mL
Propoxyphene, IA: NEGATIVE ng/mL
THC(Marijuana) Metabolite, IA: NEGATIVE ng/mL

## 2023-08-07 LAB — COCAINE,MS,WB/SP RFX
Benzoylecgonine: >1500 ng/mL
Cocaine Confirmation: POSITIVE
Cocaine: NEGATIVE ng/mL

## 2023-08-07 NOTE — ED Notes (Signed)
Pt reports concern for possible infection in his right arm, where Pt reports he recently had IV access in place during his hospital admission.

## 2023-08-07 NOTE — ED Notes (Signed)
Pt walked one lap around ED. Oxygen level remained at or above 97%. KG

## 2023-08-07 NOTE — ED Triage Notes (Signed)
Pt arrived via POV c/o SOB. Pt reports after he "ate some ice cream last night" he has been SOB. Pt reports he is scheduled to have dialysis tomorrow.

## 2023-08-07 NOTE — ED Notes (Signed)
Called for Pt from waiting room Xv 3. No answer.

## 2023-08-07 NOTE — ED Provider Notes (Signed)
[place] EMERGENCY DEPARTMENT AT [facility] Provider Note   CSN: [id number] Arrival date & time: 08/07/23  1051     History  Chief Complaint  Patient presents with   Shortness of Breath    Damon Williams is a 63 y.o. male with a history including end-stage renal disease, Tuesday Thursday Saturday dialysis who completed a full dialysis session 2 days ago, presenting with increased shortness of breath and peripheral edema.  He reports he has been compliant with his home medications, including his Lasix which he took this am but feels he needs early dialysis.  He endorses worsening swelling in his legs and has difficulty breathing, especially when lying flat.  He does endorse drinking more fluids yesterday (gingerale) than he is supposed to.  He does make a small amount of urine.  He denies chest pain, abd pain, n/v, fever, cough or other complaints.   The history is provided by the patient.       Home Medications Prior to Admission medications   Medication Sig Start Date End Date Taking? Authorizing Provider  amLODipine (NORVASC) 5 MG tablet Take 5 mg by mouth daily. 07/20/23   [provider]  calcium acetate (PHOSLO) 667 MG capsule Take 667 mg by mouth 3 (three) times daily. 07/20/23   [provider]  furosemide (LASIX) 40 MG tablet Take 1 tablet (40 mg total) by mouth See admin instructions. Take 40 mg 4 times a week on non-dialysis days PRN for swelling,SOB, weight gain 03/15/23   Sharlene Dory, NP  hydrALAZINE (APRESOLINE) 25 MG tablet Take 1 tablet (25 mg total) by mouth 3 (three) times daily. 02/07/23   Shon Hale, MD  isosorbide mononitrate (IMDUR) 30 MG 24 hr tablet Take 0.5 tablets (15 mg total) by mouth daily. 02/07/23   Shon Hale, MD  telmisartan (MICARDIS) 80 MG tablet Take 80 mg by mouth daily. 06/26/23   [provider]      Allergies    Patient has no known allergies.    Review of Systems   Review of Systems   Constitutional:  Negative for fever.  HENT:  Negative for congestion and sore throat.   Eyes: Negative.   Respiratory:  Positive for cough and shortness of breath. Negative for chest tightness.   Cardiovascular:  Positive for palpitations and leg swelling. Negative for chest pain.  Gastrointestinal:  Negative for abdominal pain, nausea and vomiting.  Genitourinary: Negative.   Musculoskeletal:  Negative for arthralgias, joint swelling and neck pain.  Skin: Negative.  Negative for rash and wound.  Neurological:  Negative for dizziness, weakness, light-headedness, numbness and headaches.  Psychiatric/Behavioral: Negative.      Physical Exam Updated Vital Signs BP (!) 147/108 (BP Location: Right Arm)   Pulse (!) 108   Temp 98 F (36.7 C) (Oral)   Resp 18   Ht 5\' 7"  (1.702 m)   Wt 64.2 kg   SpO2 95%   BMI 22.17 kg/m  Physical Exam Vitals and nursing note reviewed.  Constitutional:      Appearance: He is well-developed.  HENT:     Head: Normocephalic and atraumatic.  Eyes:     Conjunctiva/sclera: Conjunctivae normal.  Cardiovascular:     Rate and Rhythm: Normal rate and regular rhythm.     Heart sounds: Normal heart sounds.  Pulmonary:     Effort: Pulmonary effort is normal.     Breath sounds: Examination of the right-middle field reveals rales. Examination of the left-middle field reveals rales.  Examination of the right-lower field reveals rales. Examination of the left-lower field reveals rales. Decreased breath sounds and rales present. No wheezing.  Abdominal:     General: Bowel sounds are normal.     Palpations: Abdomen is soft.     Tenderness: There is no abdominal tenderness.  Musculoskeletal:        General: Normal range of motion.     Cervical back: Normal range of motion.     Right lower leg: Edema present.     Left lower leg: Edema present.     Comments: Bilateral pitting edema to upper thighs.  Skin:    General: Skin is warm and dry.  Neurological:      Mental Status: He is alert.     ED Results / Procedures / Treatments   Labs (all labs ordered are listed, but only abnormal results are displayed) Labs Reviewed  BASIC METABOLIC PANEL - Abnormal; Notable for the following components:      Result Value   Chloride 95 (*)    CO2 20 (*)    BUN 45 (*)    Creatinine, Ser 8.47 (*)    Calcium 8.1 (*)    GFR, Estimated 7 (*)    Anion gap 20 (*)    All other components within normal limits    EKG None  Radiology DG Chest Portable 1 View Result Date: 08/07/2023 CLINICAL DATA:  Shortness of breath. Peripheral edema. The patient had dialysis today. EXAM: PORTABLE CHEST 1 VIEW COMPARISON:  07/25/2023 FINDINGS: Mild enlargement of the cardiopericardial silhouette. The patient is rotated to the left on today's radiograph, reducing diagnostic sensitivity and specificity. Interstitial opacity and potentially mild hazy airspace opacity at the right lung base. Bandlike opacity at the left lung base. Chronic deformity of the left inferior scapula as on prior exams back through 2008. IMPRESSION: 1. Mild enlargement of the cardiopericardial silhouette, with interstitial opacity and potentially mild hazy airspace opacity at the right lung base. This could reflect edema or atypical infection. 2. Bandlike opacity at the left lung base, likely atelectasis. 3. Chronic deformity of the left inferior scapula. Electronically Signed   By: Gaylyn Rong M.D.   On: 08/07/2023 16:35    Procedures Procedures    Medications Ordered in ED Medications - No data to display  ED Course/ Medical Decision Making/ A&P                                 Medical Decision Making Presenting with shortness of breath and increased peripheral edema, he is a dialysis patient, Tuesday Thursday Saturday and did complete his dialysis on Saturday but endorses increased fluid intake which may have caused worsening symptoms.  He has no chest pain.  He is clinically fluid overloaded.   I be met and chest x-ray were ordered to further evaluate his need for early dialysis today.  While awaiting these results I was informed by the nursing staff that he was tired of waiting and he left the department.  I was not informed of his departure before he left.  He is scheduled for his routine dialysis in the morning.  Amount and/or Complexity of Data Reviewed Labs: ordered.    Details: Labs reviewed and are stable. Radiology: ordered.    Details: Chest x-ray with interstitial edema versus atypical pneumonia.           Final Clinical Impression(s) / ED Diagnoses Final diagnoses:  Shortness  of breath  Peripheral edema    Rx / DC Orders ED Discharge Orders     None         Victoriano Lain 08/07/23 2242    Benjiman Core, MD 08/08/23 [phone]

## 2023-08-07 NOTE — ED Notes (Signed)
Pt decided not to wait for further treatment and signed out AMA

## 2023-08-08 DIAGNOSIS — N186 End stage renal disease: Secondary | ICD-10-CM | POA: Diagnosis not present

## 2023-08-08 DIAGNOSIS — Z992 Dependence on renal dialysis: Secondary | ICD-10-CM | POA: Diagnosis not present

## 2023-08-10 DIAGNOSIS — Z992 Dependence on renal dialysis: Secondary | ICD-10-CM | POA: Diagnosis not present

## 2023-08-10 DIAGNOSIS — N186 End stage renal disease: Secondary | ICD-10-CM | POA: Diagnosis not present

## 2023-08-11 DIAGNOSIS — N186 End stage renal disease: Secondary | ICD-10-CM | POA: Diagnosis not present

## 2023-08-11 DIAGNOSIS — Z992 Dependence on renal dialysis: Secondary | ICD-10-CM | POA: Diagnosis not present

## 2023-08-15 ENCOUNTER — Encounter: Payer: Self-pay | Admitting: Nurse Practitioner

## 2023-08-15 ENCOUNTER — Emergency Department (HOSPITAL_COMMUNITY)
Admission: EM | Admit: 2023-08-15 | Discharge: 2023-08-15 | Discharge status: Against Medical Advice | Payer: 59 | Source: Home / Self Care

## 2023-08-15 ENCOUNTER — Ambulatory Visit: Payer: 59 | Attending: Nurse Practitioner | Admitting: Nurse Practitioner

## 2023-08-15 ENCOUNTER — Telehealth: Payer: Self-pay | Admitting: Nurse Practitioner

## 2023-08-15 DIAGNOSIS — I10 Essential (primary) hypertension: Secondary | ICD-10-CM | POA: Diagnosis not present

## 2023-08-15 DIAGNOSIS — Z720 Tobacco use: Secondary | ICD-10-CM

## 2023-08-15 DIAGNOSIS — I429 Cardiomyopathy, unspecified: Secondary | ICD-10-CM

## 2023-08-15 DIAGNOSIS — I428 Other cardiomyopathies: Secondary | ICD-10-CM

## 2023-08-15 DIAGNOSIS — I3139 Other pericardial effusion (noninflammatory): Secondary | ICD-10-CM | POA: Diagnosis not present

## 2023-08-15 DIAGNOSIS — F1991 Other psychoactive substance use, unspecified, in remission: Secondary | ICD-10-CM

## 2023-08-15 DIAGNOSIS — I340 Nonrheumatic mitral (valve) insufficiency: Secondary | ICD-10-CM

## 2023-08-15 DIAGNOSIS — Z992 Dependence on renal dialysis: Secondary | ICD-10-CM

## 2023-08-15 DIAGNOSIS — I77819 Aortic ectasia, unspecified site: Secondary | ICD-10-CM

## 2023-08-15 DIAGNOSIS — N186 End stage renal disease: Secondary | ICD-10-CM

## 2023-08-15 NOTE — Progress Notes (Addendum)
 Virtual Visit via Telephone Note   Because of Damon Williams's co-morbid illnesses, he is at least at moderate risk for complications without adequate follow up.  This format is felt to be most appropriate for this patient at this time.  The patient did not have access to video technology/had technical difficulties with video requiring transitioning to audio format only (telephone).  All issues noted in this document were discussed and addressed.  No physical exam could be performed with this format.  Please refer to the patient's chart for his consent to telehealth for [facility].    Date:  08/15/2023  ID:  Damon Williams, DOB 1961/01/19, MRN [id number] The patient was identified using 2 identifiers. Patient Location: Home Provider Location: Office/Clinic PCP:  Benjamin Raina Remmington Urieta, NP [facility] Providers Cardiologist:  Alvan Carrier, MD     Evaluation Performed:  Follow-Up Visit  Chief Complaint:  Here for follow-up  History of Present Illness:    Damon Williams is a 63 y.o. male with a PMH of HTN, ESRD, hx of polysubstance abuse (current cocaine  user, current smoker, former ETOH abuse), pericardial effusion, MR, aortic dilatation, and cardiomyopathy, who is seen today for follow-up. History of frequent ED visits and hospitalizations noted below.  TTE 10/2022 revealed EF 30-35%.   I last saw patient on March 15, 2023. Lost 13 lbs since last office visit. Was losing weight despite eating 3 meals per day. Denied any additional issues with insects after being out of the [employer] [place]. Denied any more cocaine  use. Only smoked about one cigarette per his report.   In the interim, he has had multiple ED visits/hospital admissions.   Most recently hospitalized in January 2025 for hyperkalemia. Missed 2 sessions of HD. Did not take his medications for at least 4 days, d/t dropping medications in the toilet, had not requested refills. Admitted to stable leg  edema. Hospital course noted with significantly elevated liver enzymes. RUQ US  was negative for gallstones or ductal dilations. Hepatitis panel negative. Outpt GI follow-up arranged.   Today he presents for telehealth follow-up. He states he has his good days and not so good days. Still doing dialysis. Has sometimes that he feels short of breath that is related to when he doesn't adhere to his fluid restrictions, says this improves usually on dialysis. BP at home is well controlled. Says his weight is coming back on. Does admit to stable leg edema, says this has improved than it was before. Continues to stay sober from cocaine . Occasionally smokes, says he is trying not to smoke and has not drank alcohol since 1999. Denies any chest pain,  palpitations, syncope, presyncope, dizziness, orthopnea, PND, significant weight changes, acute bleeding, or claudication.  Family History: Hypercholesterolemia: Mother and Father Stroke: Paternal Grandfather HTN: Brother and Sister  ROS:   Please see the history of present illness.    All other systems reviewed and are negative.   Prior CV studies:   The following studies were reviewed today:  Echo 10/2022: 1. Left ventricular ejection fraction, by estimation, is 30 to 35%. The  left ventricle has moderately decreased function. The left ventricle  demonstrates global hypokinesis. The left ventricular internal cavity size  was mildly to moderately dilated.  There is mild concentric left ventricular hypertrophy. Left ventricular  diastolic parameters are consistent with Grade II diastolic dysfunction  (pseudonormalization).   2. Right ventricular systolic function is low normal. The right  ventricular size is normal. Tricuspid regurgitation signal is  inadequate  for assessing PA pressure.   3. Left atrial size was moderately dilated.   4. Right atrial size was mildly dilated.   5. A small pericardial effusion is present. The pericardial effusion is  posterior to the left ventricle.   6. The mitral valve is grossly normal, mildly thickened. Mild mitral  valve regurgitation.   7. The aortic valve is tricuspid. Aortic valve regurgitation is not  visualized.   8. Aortic dilatation noted. There is moderate dilatation of the aortic  root, measuring 43 mm.   9. The inferior vena cava is dilated in size with >50% respiratory  variability, suggesting right atrial pressure of 8 mmHg.   Comparison(s): No prior Echocardiogram. Consider hypertensive versus infiltrative cardiomyopathy.   Labs/Other Tests and Data Reviewed:    EKG:  EKG is not ordered today.   Risk Assessment/Calculations:    The 10-year ASCVD risk score (Arnett DK, et al., 2019) is: 24.2%   Values used to calculate the score:     Age: 59 years     Sex: Male     Is Non-Hispanic African American: Yes     Diabetic: No     Tobacco smoker: Yes     Systolic Blood Pressure: 134 mmHg     Is BP treated: Yes     HDL Cholesterol: 31 mg/dL     Total Cholesterol: 151 mg/dL       Objective:    Vital Signs:  There were no vitals taken for this visit.   Due to nature of today's visit a physical exam was unable to be performed.   ASSESSMENT & PLAN:    Hypertensive cardiomyopathy, NICM Diagnosed 10/2022. Stage C, NYHA class I symptoms. TTE revealed EF 30-35%, grade 2 DD, was recommended to consider hypertensive vs infiltrative CM. No FH of CHF in his family per his report. GDMT limited d/t ESRD. Continue hydralazine , Lasix , telmisartan, and Imdur .  Low sodium diet, fluid restriction <2L, and daily weights encouraged. Educated to contact our office for weight gain of 2 lbs overnight or 5 lbs in one week. No longer using cocaine , I congratulated him. Previously referred to cardiac rehab. Continue HD as scheduled. Care and ED precautions discussed. Will update Echo at this time and obtain BNP, BMET and Mag level.      HTN States BP well controlled at home. Discussed to monitor BP at  home at least 2 hours after medications and sitting for 5-10 minutes. Goal SBP < 130. Continue medication regimen. Low salt, heart healthy diet encouraged.    3. Pericardial effusion Small pericardial effusion noted on past TTE, pt denies any red flag symptoms.  ED precautions discussed. Updating Echo at this time.    4. Mitral regurgitation Mild mitral regurgitation noted on TTE. Will update Echo.    5. Aortic dilatation Moderate dilatation of aortic root at 43 mm. Will update Echo at this time. Care and ED precautions discussed. Heart healthy diet encouraged.    6. ESRD on HD Currently on a Tues/Thurs/Sat schedule for HD. No medication changes at this time. Continue to follow-up with Dr. Rachele.    7. Tobacco use, hx of drug use Congratulated him on stopping cocaine  and previously discussed risks of cocaine  use if he were to continue using it. Smoking cessation encouraged and discussed.   Time:   Today, I have spent 12 minutes with the patient with telehealth technology discussing the above problems.     Medication Adjustments/Labs and Tests Ordered: Current medicines are  reviewed at length with the patient today.  Concerns regarding medicines are outlined above.   Tests Ordered: Orders Placed This Encounter  Procedures   Brain natriuretic peptide   Magnesium   Basic metabolic panel   ECHOCARDIOGRAM COMPLETE    Medication Changes: No orders of the defined types were placed in this encounter.   Follow Up:  In Person in 6-8 week(s)  Signed, Almarie Crate, NP  08/15/2023 2:33 PM    [facility]

## 2023-08-15 NOTE — Telephone Encounter (Signed)
  Patient Consent for Virtual Visit        Damon Williams has provided verbal consent on 08/15/2023 for a virtual visit (video or telephone).   CONSENT FOR VIRTUAL VISIT FOR:  Damon Williams  By participating in this virtual visit I agree to the following:  I hereby voluntarily request, consent and authorize [facility] and its employed or contracted physicians, physician assistants, nurse practitioners or other licensed health care professionals (the Practitioner), to provide me with telemedicine health care services (the "Services) as deemed necessary by the treating Practitioner. I acknowledge and consent to receive the Services by the Practitioner via telemedicine. I understand that the telemedicine visit will involve communicating with the Practitioner through live audiovisual communication technology and the disclosure of certain medical information by electronic transmission. I acknowledge that I have been given the opportunity to request an in-person assessment or other available alternative prior to the telemedicine visit and am voluntarily participating in the telemedicine visit.  I understand that I have the right to withhold or withdraw my consent to the use of telemedicine in the course of my care at any time, without affecting my right to future care or treatment, and that the Practitioner or I may terminate the telemedicine visit at any time. I understand that I have the right to inspect all information obtained and/or recorded in the course of the telemedicine visit and may receive copies of available information for a reasonable fee.  I understand that some of the potential risks of receiving the Services via telemedicine include:  Delay or interruption in medical evaluation due to technological equipment failure or disruption; Information transmitted may not be sufficient (e.g. poor resolution of images) to allow for appropriate medical decision making by the  Practitioner; and/or  In rare instances, security protocols could fail, causing a breach of personal health information.  Furthermore, I acknowledge that it is my responsibility to provide information about my medical history, conditions and care that is complete and accurate to the best of my ability. I acknowledge that Practitioner's advice, recommendations, and/or decision may be based on factors not within their control, such as incomplete or inaccurate data provided by me or distortions of diagnostic images or specimens that may result from electronic transmissions. I understand that the practice of medicine is not an exact science and that Practitioner makes no warranties or guarantees regarding treatment outcomes. I acknowledge that a copy of this consent can be made available to me via my patient portal [facility] MyChart), or I can request a printed copy by calling the office of [facility].    I understand that my insurance will be billed for this visit.   I have read or had this consent read to me. I understand the contents of this consent, which adequately explains the benefits and risks of the Services being provided via telemedicine.  I have been provided ample opportunity to ask questions regarding this consent and the Services and have had my questions answered to my satisfaction. I give my informed consent for the services to be provided through the use of telemedicine in my medical care

## 2023-08-15 NOTE — Patient Instructions (Addendum)
 Medication Instructions:  Your physician recommends that you continue on your current medications as directed. Please refer to the Current Medication list given to you today.  Labwork: 2-3 weeks at [facility] orders have been sent around 2/18-2/25  Testing/Procedures: Your physician has requested that you have an echocardiogram. Echocardiography is a painless test that uses sound waves to create images of your heart. It provides your doctor with information about the size and shape of your heart and how well your heart's chambers and valves are working. This procedure takes approximately one hour. There are no restrictions for this procedure. Please do NOT wear cologne, perfume, aftershave, or lotions (deodorant is allowed). Please arrive 15 minutes prior to your appointment time.  Please note: We ask at that you not bring children with you during ultrasound (echo/ vascular) testing. Due to room size and safety concerns, children are not allowed in the ultrasound rooms during exams. Our front office staff cannot provide observation of children in our lobby area while testing is being conducted. An adult accompanying a patient to their appointment will only be allowed in the ultrasound room at the discretion of the ultrasound technician under special circumstances. We apologize for any inconvenience.  Follow-Up: Your physician recommends that you schedule a follow-up appointment in: 6-8 weeks   Any Other Special Instructions Will Be Listed Below (If Applicable).  If you need a refill on your cardiac medications before your next appointment, please call your pharmacy.

## 2023-08-22 ENCOUNTER — Telehealth: Payer: 59 | Admitting: Nurse Practitioner

## 2023-08-29 ENCOUNTER — Encounter (INDEPENDENT_AMBULATORY_CARE_PROVIDER_SITE_OTHER): Payer: Self-pay | Admitting: *Deleted

## 2023-08-29 ENCOUNTER — Ambulatory Visit (HOSPITAL_COMMUNITY): Payer: 59

## 2023-09-10 ENCOUNTER — Emergency Department (HOSPITAL_COMMUNITY)
Admission: EM | Admit: 2023-09-10 | Discharge: 2023-09-10 | Disposition: A | Discharge status: Home / Self Care | Attending: Emergency Medicine | Admitting: Emergency Medicine

## 2023-09-10 ENCOUNTER — Encounter (HOSPITAL_COMMUNITY): Payer: Self-pay | Admitting: Emergency Medicine

## 2023-09-10 ENCOUNTER — Other Ambulatory Visit: Payer: Self-pay

## 2023-09-10 ENCOUNTER — Emergency Department (HOSPITAL_COMMUNITY)

## 2023-09-10 DIAGNOSIS — J90 Pleural effusion, not elsewhere classified: Secondary | ICD-10-CM | POA: Insufficient documentation

## 2023-09-10 DIAGNOSIS — E875 Hyperkalemia: Secondary | ICD-10-CM | POA: Insufficient documentation

## 2023-09-10 DIAGNOSIS — R0602 Shortness of breath: Secondary | ICD-10-CM | POA: Diagnosis present

## 2023-09-10 LAB — CBC WITH DIFFERENTIAL/PLATELET
Abs Immature Granulocytes: 0.03 10*3/uL (ref 0.00–0.07)
Basophils Absolute: 0.1 10*3/uL (ref 0.0–0.1)
Basophils Relative: 1 %
Eosinophils Absolute: 0 10*3/uL (ref 0.0–0.5)
Eosinophils Relative: 1 %
HCT: 35.7 % — ABNORMAL LOW (ref 39.0–52.0)
Hemoglobin: 11.8 g/dL — ABNORMAL LOW (ref 13.0–17.0)
Immature Granulocytes: 1 %
Lymphocytes Relative: 17 %
Lymphs Abs: 1 10*3/uL (ref 0.7–4.0)
MCH: 30.9 pg (ref 26.0–34.0)
MCHC: 33.1 g/dL (ref 30.0–36.0)
MCV: 93.5 fL (ref 80.0–100.0)
Monocytes Absolute: 0.7 10*3/uL (ref 0.1–1.0)
Monocytes Relative: 12 %
Neutro Abs: 4.1 10*3/uL (ref 1.7–7.7)
Neutrophils Relative %: 68 %
Platelets: 215 10*3/uL (ref 150–400)
RBC: 3.82 MIL/uL — ABNORMAL LOW (ref 4.22–5.81)
RDW: 16.5 % — ABNORMAL HIGH (ref 11.5–15.5)
WBC: 5.9 10*3/uL (ref 4.0–10.5)
nRBC: 0 % (ref 0.0–0.2)

## 2023-09-10 LAB — COMPREHENSIVE METABOLIC PANEL
ALT: 15 U/L (ref 0–44)
AST: 14 U/L — ABNORMAL LOW (ref 15–41)
Albumin: 3.3 g/dL — ABNORMAL LOW (ref 3.5–5.0)
Alkaline Phosphatase: 74 U/L (ref 38–126)
Anion gap: 20 — ABNORMAL HIGH (ref 5–15)
BUN: 86 mg/dL — ABNORMAL HIGH (ref 6–20)
CO2: 18 mmol/L — ABNORMAL LOW (ref 22–32)
Calcium: 8.7 mg/dL — ABNORMAL LOW (ref 8.9–10.3)
Chloride: 97 mmol/L — ABNORMAL LOW (ref 98–111)
Creatinine, Ser: 14.62 mg/dL — ABNORMAL HIGH (ref 0.61–1.24)
GFR, Estimated: 3 mL/min — ABNORMAL LOW (ref >60)
Glucose, Bld: 80 mg/dL (ref 70–99)
Potassium: 5.2 mmol/L — ABNORMAL HIGH (ref 3.5–5.1)
Sodium: 135 mmol/L (ref 135–145)
Total Bilirubin: 1.3 mg/dL — ABNORMAL HIGH (ref 0.0–1.2)
Total Protein: 7.5 g/dL (ref 6.5–8.1)

## 2023-09-10 LAB — RESP PANEL BY RT-PCR (RSV, FLU A&B, COVID)  RVPGX2
Influenza A by PCR: NEGATIVE
Influenza B by PCR: NEGATIVE
Resp Syncytial Virus by PCR: NEGATIVE
SARS Coronavirus 2 by RT PCR: NEGATIVE

## 2023-09-10 LAB — HEPATITIS B SURFACE ANTIGEN: Hepatitis B Surface Ag: NONREACTIVE

## 2023-09-10 MED ORDER — PENTAFLUOROPROP-TETRAFLUOROETH EX AERO: 1.0000 | INHALATION_SPRAY | CUTANEOUS | Status: DC | PRN

## 2023-09-10 MED ORDER — HEPARIN SODIUM (PORCINE) 1000 UNIT/ML DIALYSIS: 20.0000 [IU]/kg | INTRAMUSCULAR | Status: DC | PRN

## 2023-09-10 MED ORDER — CHLORHEXIDINE GLUCONATE CLOTH 2 % EX PADS
6.0000 | MEDICATED_PAD | Freq: Every day | CUTANEOUS | Status: DC
  Administered 2023-09-10: 6 via TOPICAL

## 2023-09-10 MED ORDER — LIDOCAINE HCL (PF) 1 % IJ SOLN: 5.0000 mL | INTRAMUSCULAR | Status: DC | PRN

## 2023-09-10 MED ORDER — LIDOCAINE-PRILOCAINE 2.5-2.5 % EX CREA: 1.0000 | TOPICAL_CREAM | CUTANEOUS | Status: DC | PRN

## 2023-09-10 NOTE — Progress Notes (Signed)
 Called by ER. Patient missed HD Thurs and Sat. SOB here. Small effusions on CXR, K 5.2. Outpatient HD unit not open today therefore using orders from recent admit in Jan. [facility].  TTS.  EDW 68 kg.  [place]: 2K/2.5 calcium.  Heparin 1000 unit bolus, 500 units/h.  Access: RIJ TDC.  Flow rates of 350/500.  Plan: -HD today, off schedule.  Tight heparin.  HD RN informed.  Plan thereafter is to evaluate by ER MD and then possible discharge -Please give [place] a call/page if patient is to be admitted for full consult Please call with any questions/concerns in the interim  Anthony Sar, MD [facility]

## 2023-09-10 NOTE — ED Provider Notes (Signed)
 [place] EMERGENCY DEPARTMENT AT [facility] Provider Note   CSN: [id number] Arrival date & time: 09/10/23  4098     History  Chief Complaint  Patient presents with   Shortness of Breath    Damon Williams is a 63 y.o. male.  Presents to ED for sob after missing two rounds of dialysis. Normally goes t/th/sa but missed Thursday because of oversleeping then there was some issue with his car Saturday so didn't make it then. Go SOB tonight and came here for eval. Similar to previous episodes of fluid overload. No fevers. No cough. Has some pressure feeling around his upper chest as well.    Shortness of Breath      Home Medications Prior to Admission medications   Medication Sig Start Date End Date Taking? Authorizing Provider  amLODipine (NORVASC) 5 MG tablet Take 5 mg by mouth daily. 07/20/23   [provider]  calcium acetate (PHOSLO) 667 MG capsule Take 667 mg by mouth 3 (three) times daily. 07/20/23   [provider]  furosemide (LASIX) 40 MG tablet Take 1 tablet (40 mg total) by mouth See admin instructions. Take 40 mg 4 times a week on non-dialysis days PRN for swelling,SOB, weight gain 03/15/23   Sharlene Dory, NP  hydrALAZINE (APRESOLINE) 25 MG tablet Take 1 tablet (25 mg total) by mouth 3 (three) times daily. 02/07/23   Shon Hale, MD  isosorbide mononitrate (IMDUR) 30 MG 24 hr tablet Take 0.5 tablets (15 mg total) by mouth daily. 02/07/23   Shon Hale, MD  telmisartan (MICARDIS) 80 MG tablet Take 80 mg by mouth daily. 06/26/23   [provider]      Allergies    Patient has no known allergies.    Review of Systems   Review of Systems  Respiratory:  Positive for shortness of breath.     Physical Exam Updated Vital Signs BP (!) 141/91   Pulse (!) 106   Temp 98 F (36.7 C) (Oral)   Resp (!) 27   Ht 5\' 7"  (1.702 m)   Wt 63.5 kg   SpO2 97%   BMI 21.93 kg/m  Physical Exam Vitals and nursing note reviewed.   Constitutional:      Appearance: He is well-developed.  HENT:     Head: Normocephalic and atraumatic.  Cardiovascular:     Rate and Rhythm: Tachycardia present.  Pulmonary:     Effort: Pulmonary effort is normal. Tachypnea present. No respiratory distress.     Breath sounds: No wheezing.  Abdominal:     General: There is no distension.  Musculoskeletal:        General: Normal range of motion.     Cervical back: Normal range of motion.  Neurological:     Mental Status: He is alert.     ED Results / Procedures / Treatments   Labs (all labs ordered are listed, but only abnormal results are displayed) Labs Reviewed  CBC WITH DIFFERENTIAL/PLATELET - Abnormal; Notable for the following components:      Result Value   RBC 3.82 (*)    Hemoglobin 11.8 (*)    HCT 35.7 (*)    RDW 16.5 (*)    All other components within normal limits  COMPREHENSIVE METABOLIC PANEL - Abnormal; Notable for the following components:   Potassium 5.2 (*)    Chloride 97 (*)    CO2 18 (*)    BUN 86 (*)    Creatinine, Ser 14.62 (*)  Calcium 8.7 (*)    Albumin 3.3 (*)    AST 14 (*)    Total Bilirubin 1.3 (*)    GFR, Estimated 3 (*)    Anion gap 20 (*)    All other components within normal limits  RESP PANEL BY RT-PCR (RSV, FLU A&B, COVID)  [id number]    EKG None  Radiology DG Chest Portable 1 View Result Date: 09/10/2023 CLINICAL DATA:  Difficulty breathing for 2 days EXAM: PORTABLE CHEST 1 VIEW COMPARISON:  08/07/2023 FINDINGS: Cardiac shadow is enlarged but stable. Lungs are well aerated bilaterally. Blunting of the costophrenic angles is seen consistent with small effusions. No focal infiltrate is noted. No bony abnormality is seen. IMPRESSION: Small pleural effusions. Stable cardiomegaly. Electronically Signed   By: Alcide Clever M.D.   On: 09/10/2023 02:54    Procedures Procedures    Medications Ordered in ED Medications - No data to display  ED Course/ Medical Decision Making/ A&P                                  Medical Decision Making Amount and/or Complexity of Data Reviewed Labs: ordered. Radiology: ordered. ECG/medicine tests: ordered.   No hypoxia, but small effusions on xr and tachypneic with mild hyperkalemia nd no ecg changes. Probably not ok to wait until Monday or Tuesday for dialysis will d/w Nephrology.   D/w Dr. Thedore Mins, will get on schedule later today. Will likely come back to ED for reeval.    Final Clinical Impression(s) / ED Diagnoses Final diagnoses:  Pleural effusion    Rx / DC Orders ED Discharge Orders     None         Shanie Mauzy, Barbara Cower, MD 09/10/23 [phone]

## 2023-09-10 NOTE — ED Notes (Signed)
 Patient arrived back from dialysis treatment

## 2023-09-10 NOTE — ED Triage Notes (Signed)
 Pt here with c/o difficulty breathing since missing dialysis x 2 days. States his throat is closing up and his neck hurts.

## 2023-09-10 NOTE — Progress Notes (Signed)
   HEMODIALYSIS TREATMENT NOTE:  3 hour heparin-free treatment completed using left upper arm AVF (15g/antegrade). Goal met: 3.7 liters removed without interruption in UF.  All blood was returned.  Hemostasis was achieved in 15 minutes.  Post-HD:  09/10/23 1735  Vitals  Temp 98.1 F (36.7 C)  Temp Source Oral  BP (!) 145/85  MAP (mmHg) 111  BP Location Right Arm  BP Method Automatic  Patient Position (if appropriate) Standing  Pulse Rate 99  Pulse Rate Source Monitor  ECG Heart Rate 98  Resp 19  Oxygen Therapy  SpO2 96 %  O2 Device Room Air  Post Treatment  Dialyzer Clearance Lightly streaked  Hemodialysis Intake (mL) 0 mL  Liters Processed 57.1  Fluid Removed (mL) 3700 mL  Tolerated HD Treatment Yes  Post-Hemodialysis Comments Goal met  AVG/AVF Arterial Site Held (minutes) 7 minutes  AVG/AVF Venous Site Held (minutes) 7 minutes  Fistula / Graft Left Upper arm Arteriovenous fistula  Placement Date/Time: 12/27/22 0835   Placed prior to admission: No  Orientation: Left  Access Location: Upper arm  Access Type: Arteriovenous fistula  Fistula / Graft Assessment Thrill;Bruit  Status Patent    Arman Filter, RN AP K[facility]

## 2023-09-10 NOTE — Discharge Instructions (Addendum)
 Continue your scheduled dialysis.  Return to the emergency department for any new or worsening symptoms of concern.

## 2023-09-10 NOTE — ED Provider Notes (Signed)
 Care of patient assumed from Dr. Clayborne Dana.  This patient arrived in the ED last night with shortness of breath in the setting of missed dialysis.  Plan is for dialysis today. Physical Exam  BP (!) 142/95   Pulse 99   Temp 97.9 F (36.6 C) (Oral)   Resp (!) 32   Ht 5\' 7"  (1.702 m)   Wt 59.8 kg   SpO2 96%   BMI 20.65 kg/m   Physical Exam Vitals and nursing note reviewed.  Constitutional:      General: He is not in acute distress.    Appearance: He is well-developed. He is not ill-appearing, toxic-appearing or diaphoretic.  HENT:     Head: Normocephalic and atraumatic.     Mouth/Throat:     Mouth: Mucous membranes are moist.  Eyes:     Extraocular Movements: Extraocular movements intact.     Conjunctiva/sclera: Conjunctivae normal.  Cardiovascular:     Rate and Rhythm: Normal rate and regular rhythm.     Heart sounds: No murmur heard. Pulmonary:     Effort: Pulmonary effort is normal. No respiratory distress.     Breath sounds: Normal breath sounds.  Abdominal:     Palpations: Abdomen is soft.     Tenderness: There is no abdominal tenderness.  Musculoskeletal:        General: No swelling. Normal range of motion.     Cervical back: Normal range of motion and neck supple.  Skin:    General: Skin is warm and dry.     Coloration: Skin is not cyanotic or pale.  Neurological:     General: No focal deficit present.     Mental Status: He is alert and oriented to person, place, and time.  Psychiatric:        Mood and Affect: Mood normal.        Behavior: Behavior normal.     Procedures  Procedures  ED Course / MDM    Medical Decision Making Amount and/or Complexity of Data Reviewed Labs: ordered. Radiology: ordered. ECG/medicine tests: ordered.   Patient underwent dialysis session.  He returned to the ED with improved shortness of breath.  Current breathing is unlabored.  SpO2 is normal on room air.  Patient does request discharge home at this time.       Gloris Manchester, MD 09/10/23 [phone]

## 2023-09-12 LAB — HEPATITIS B SURFACE ANTIBODY, QUANTITATIVE: Hep B S AB Quant (Post): <3.5 m[IU]/mL — ABNORMAL LOW

## 2023-09-13 ENCOUNTER — Ambulatory Visit (HOSPITAL_COMMUNITY): Payer: 59 | Attending: Nurse Practitioner

## 2023-10-24 ENCOUNTER — Encounter: Payer: Self-pay | Admitting: Student

## 2023-10-24 ENCOUNTER — Ambulatory Visit: Payer: 59 | Admitting: Student

## 2023-10-24 NOTE — Progress Notes (Deleted)
 Cardiology Office Note    Date:  10/24/2023  ID:  Damon Williams, DOB 1961-02-10, MRN [id number] Cardiologist: Dina Rich, MD    History of Present Illness:    Damon Williams is a 63 y.o. male with past medical history of chronic HFrEF (EF 30-35% by echo in 10/2022), Stage III CKD, HTN, cocaine use and tobacco use who presents to the office today for overdue follow-up.  He was examined by Sharlene Dory, NP in 03/2023 and had lost over 35 pounds unintentionally over the past few months. Denied any recent anginal symptoms or recurrent cocaine use at that time. He appeared euvolemic by examination and was continued on Coreg, Hydralazine and Imdur. GDMT had overall been limited due to ESRD. Was encouraged to follow-up with his PCP for further workup of unintentional weight loss.  He was admitted in 07/2023 for hyperkalemia after having missed HD sessions. Received HD during admission and was discharged on Amlodipine 5 mg daily, Lasix 40 mg as needed, Hydralazine 25 mg 3 times daily, Imdur 15 mg daily and Telmisartan 80 mg daily. He did have a telehealth visit with Sharlene Dory, NP in 08/2023 and reported having "good and bad days". A follow-up echocardiogram was recommended for reassessment of his EF, pericardial effusion and mitral regurgitation. Unfortunately, he no showed for this.  ROS: ***  Studies Reviewed:   EKG: EKG is*** ordered today and demonstrates ***   EKG Interpretation Date/Time:    Ventricular Rate:    PR Interval:    QRS Duration:    QT Interval:    QTC Calculation:   R Axis:      Text Interpretation:         Echocardiogram: 10/2022 IMPRESSIONS     1. Left ventricular ejection fraction, by estimation, is 30 to 35%. The  left ventricle has moderately decreased function. The left ventricle  demonstrates global hypokinesis. The left ventricular internal cavity size  was mildly to moderately dilated.  There is mild concentric left ventricular  hypertrophy. Left ventricular  diastolic parameters are consistent with Grade II diastolic dysfunction  (pseudonormalization).   2. Right ventricular systolic function is low normal. The right  ventricular size is normal. Tricuspid regurgitation signal is inadequate  for assessing PA pressure.   3. Left atrial size was moderately dilated.   4. Right atrial size was mildly dilated.   5. A small pericardial effusion is present. The pericardial effusion is  posterior to the left ventricle.   6. The mitral valve is grossly normal, mildly thickened. Mild mitral  valve regurgitation.   7. The aortic valve is tricuspid. Aortic valve regurgitation is not  visualized.   8. Aortic dilatation noted. There is moderate dilatation of the aortic  root, measuring 43 mm.   9. The inferior vena cava is dilated in size with >50% respiratory  variability, suggesting right atrial pressure of 8 mmHg.   Comparison(s): No prior Echocardiogram. Consider hypertensive versus  infiltrative cardiomyopathy.    Risk Assessment/Calculations:   {Does this patient have ATRIAL FIBRILLATION?[phone]} No BP recorded.  {Refresh Note OR Click here to enter BP  :1}***         Physical Exam:   VS:  There were no vitals taken for this visit.   Wt Readings from Last 3 Encounters:  09/10/23 131 lb 13.4 oz (59.8 kg)  08/07/23 141 lb 8.6 oz (64.2 kg)  07/27/23 141 lb 8.6 oz (64.2 kg)     GEN: Well nourished, well developed in no  acute distress NECK: No JVD; No carotid bruits CARDIAC: ***RRR, no murmurs, rubs, gallops RESPIRATORY:  Clear to auscultation without rales, wheezing or rhonchi  ABDOMEN: Appears non-distended. No obvious abdominal masses. EXTREMITIES: No clubbing or cyanosis. No edema.  Distal pedal pulses are 2+ bilaterally.   Assessment and Plan:      {Are you ordering a CV Procedure (e.g. stress test, cath, DCCV, TEE, etc)?   Press F2        :[id number]}   Signed, Dorma Gash, PA-C

## 2023-11-30 ENCOUNTER — Other Ambulatory Visit (HOSPITAL_COMMUNITY): Payer: Self-pay | Admitting: Internal Medicine

## 2023-11-30 ENCOUNTER — Encounter (INDEPENDENT_AMBULATORY_CARE_PROVIDER_SITE_OTHER): Payer: Self-pay | Admitting: *Deleted

## 2023-11-30 DIAGNOSIS — Z87891 Personal history of nicotine dependence: Secondary | ICD-10-CM

## 2023-12-16 ENCOUNTER — Ambulatory Visit (HOSPITAL_COMMUNITY)
Admission: RE | Admit: 2023-12-16 | Discharge: 2023-12-16 | Disposition: A | Discharge status: Home / Self Care | Source: Ambulatory Visit | Attending: Internal Medicine | Admitting: Internal Medicine

## 2023-12-16 DIAGNOSIS — Z87891 Personal history of nicotine dependence: Secondary | ICD-10-CM | POA: Insufficient documentation

## 2023-12-21 ENCOUNTER — Telehealth: Payer: Self-pay

## 2023-12-21 NOTE — Telephone Encounter (Signed)
 Who is your primary care physician: DR.SYED HASSANI  Reasons for the colonoscopy: NO  Have you had a colonoscopy before?  NO  Do you have family history of colon cancer? NO  Previous colonoscopy with polyps removed? NO  Do you have a history colorectal cancer?   NO  Are you diabetic? If yes, Type 1 or Type 2?    NO  Do you have a prosthetic or mechanical heart valve? NO  Do you have a pacemaker/defibrillator?   NO  Have you had endocarditis/atrial fibrillation? NO  Have you had joint replacement within the last 12 months?  NO  Do you tend to be constipated or have to use laxatives? NO  Do you have any history of drugs or alchohol?  NO  Do you use supplemental oxygen?  NO  Have you had a stroke or heart attack within the last 6 months? NO  Do you take weight loss medication?  NO    Do you take any blood-thinning medications such as: (aspirin, warfarin, Plavix, Aggrenox)  NO  If yes we need the name, milligram, dosage and who is prescribing doctor  Current Outpatient Medications on File Prior to Visit  Medication Sig Dispense Refill   acetaminophen  (TYLENOL ) 500 MG tablet Take 1,000 mg by mouth 2 (two) times daily as needed for moderate pain (pain score 4-6), fever or headache.     amLODipine  (NORVASC ) 5 MG tablet Take 5 mg by mouth daily.     benzonatate (TESSALON) 100 MG capsule Take 100 mg by mouth 3 (three) times daily as needed for cough.     calcium  acetate (PHOSLO ) 667 MG capsule Take 667 mg by mouth 3 (three) times daily with meals.     furosemide  (LASIX ) 40 MG tablet Take 1 tablet (40 mg total) by mouth See admin instructions. Take 40 mg 4 times a week on non-dialysis days PRN for swelling,SOB, weight gain (Patient not taking: Reported on 09/10/2023) 30 tablet 5   hydrALAZINE  (APRESOLINE ) 25 MG tablet Take 1 tablet (25 mg total) by mouth 3 (three) times daily. 90 tablet 3   isosorbide  mononitrate (IMDUR ) 30 MG 24 hr tablet Take 0.5 tablets (15 mg total) by mouth  daily. (Patient not taking: Reported on 09/10/2023) 15 tablet 5   telmisartan (MICARDIS) 80 MG tablet Take 80 mg by mouth daily.     No current facility-administered medications on file prior to visit.    No Known Allergies   Pharmacy: [facility]  Primary Insurance Name: [employer] number where you can be reached: [phone]

## 2023-12-21 NOTE — Telephone Encounter (Signed)
Room 3 Thanks 

## 2023-12-22 NOTE — Telephone Encounter (Signed)
 LMOVM to return call.

## 2023-12-28 ENCOUNTER — Encounter: Payer: Self-pay | Admitting: *Deleted

## 2023-12-28 NOTE — Telephone Encounter (Signed)
 Attempted to call pt, states call can not be completed at this time, try again later. Letter mailed

## 2024-01-22 ENCOUNTER — Telehealth (INDEPENDENT_AMBULATORY_CARE_PROVIDER_SITE_OTHER): Payer: Self-pay | Admitting: *Deleted

## 2024-01-22 NOTE — Telephone Encounter (Signed)
 Rec'd updated phone number from PCP - looks like you tried calling but couldn't get through on the number we had - I have updated the new number

## 2024-01-23 NOTE — Telephone Encounter (Signed)
 Attempted to call pt, unable to leave message due to mailbox not being set up

## 2024-01-31 ENCOUNTER — Encounter: Payer: Self-pay | Admitting: *Deleted

## 2024-01-31 NOTE — Telephone Encounter (Signed)
 Attempted to contact pt, unable to leave vm due to mailbox not being set up. Letter mailed

## 2024-02-01 ENCOUNTER — Encounter: Payer: Self-pay | Admitting: *Deleted

## 2024-02-01 ENCOUNTER — Other Ambulatory Visit: Payer: Self-pay | Admitting: *Deleted

## 2024-02-01 MED ORDER — PEG 3350-KCL-NA BICARB-NACL 420 G PO SOLR: 4000.0000 mL | Freq: Once | ORAL | 0 refills | Status: AC

## 2024-02-01 NOTE — Telephone Encounter (Signed)
 Attempted to call pt, unable to leave message due to vm not being set up

## 2024-02-01 NOTE — Telephone Encounter (Signed)
 Pt states that he can't drink the prep and wants to know if there is anything else he can do ? Please advise. Thank you

## 2024-02-01 NOTE — Telephone Encounter (Signed)
 What does he mean with not being able to drink the prep? He cannot swallow? He needs to have a prep to have a colonoscopy, whether it is a regular or smaller volume.

## 2024-02-01 NOTE — Telephone Encounter (Signed)
 Dr.Castaneda informed that pt does want to have the procedure done.  Pt is just concerned that he can only have 32 oz total of fluid per day and afraid to drink any more because he doesn't want to add fluid to himself.  Per Dr.Castaneda: Please let him know the liquid will not be absorbed as it is all pooped , even though is a large amount, it will be defeated with the rest of the stool - it does not get absorbed

## 2024-02-01 NOTE — Telephone Encounter (Signed)
 Dr.Castaneda informed that pt is on dialysis and can only have trilyte .  Per Dr.Castaneda: but did he say he does not want to have it? please let him know that is the only prep we hae approved for dialysis patients unfortunately, so if he wants to have the colonoscopy we will have to use. can send some Zofran  to his pharmacy if the issue is nausea

## 2024-02-01 NOTE — Telephone Encounter (Signed)
 Thanks

## 2024-02-01 NOTE — Telephone Encounter (Signed)
 Pt has been scheduled for 03/01/24. Instructions mailed and prep sent to pharmacy.

## 2024-02-01 NOTE — Telephone Encounter (Signed)
 Pt has been informed and verbalized understanding of providers message. He states he would like the Zofran  to be sent [facility] to [facility] [place], [place]

## 2024-02-02 ENCOUNTER — Other Ambulatory Visit (INDEPENDENT_AMBULATORY_CARE_PROVIDER_SITE_OTHER): Payer: Self-pay | Admitting: Gastroenterology

## 2024-02-02 ENCOUNTER — Encounter (INDEPENDENT_AMBULATORY_CARE_PROVIDER_SITE_OTHER): Payer: Self-pay | Admitting: *Deleted

## 2024-02-02 MED ORDER — ONDANSETRON HCL 4 MG PO TABS: 4.0000 mg | ORAL_TABLET | Freq: Four times a day (QID) | ORAL | 0 refills | Status: DC | PRN

## 2024-02-02 NOTE — Telephone Encounter (Signed)
 Zofran sent to pharmacy

## 2024-02-02 NOTE — Telephone Encounter (Signed)
 Pt would like for the Zofran  to be sent in to help with nausea when doing the prep.

## 2024-02-02 NOTE — Telephone Encounter (Signed)
 Pt informed of rx sent to pharmacy.

## 2024-02-02 NOTE — Telephone Encounter (Signed)
 Referral completed, TCS apt letter sent to PCP

## 2024-02-22 ENCOUNTER — Encounter (HOSPITAL_COMMUNITY): Payer: Self-pay | Admitting: Emergency Medicine

## 2024-02-22 ENCOUNTER — Emergency Department (HOSPITAL_COMMUNITY)
Admission: EM | Admit: 2024-02-22 | Discharge: 2024-02-22 | Disposition: A | Discharge status: Home / Self Care | Attending: Emergency Medicine | Admitting: Emergency Medicine

## 2024-02-22 ENCOUNTER — Emergency Department (HOSPITAL_COMMUNITY)

## 2024-02-22 ENCOUNTER — Other Ambulatory Visit: Payer: Self-pay

## 2024-02-22 DIAGNOSIS — R000 Tachycardia, unspecified: Secondary | ICD-10-CM | POA: Diagnosis present

## 2024-02-22 DIAGNOSIS — N189 Chronic kidney disease, unspecified: Secondary | ICD-10-CM | POA: Insufficient documentation

## 2024-02-22 DIAGNOSIS — I471 Supraventricular tachycardia, unspecified: Secondary | ICD-10-CM | POA: Diagnosis not present

## 2024-02-22 DIAGNOSIS — I509 Heart failure, unspecified: Secondary | ICD-10-CM | POA: Insufficient documentation

## 2024-02-22 LAB — COMPREHENSIVE METABOLIC PANEL WITH GFR
ALT: 14 U/L (ref 0–44)
AST: 20 U/L (ref 15–41)
Albumin: 3 g/dL — ABNORMAL LOW (ref 3.5–5.0)
Alkaline Phosphatase: 70 U/L (ref 38–126)
Anion gap: 16 — ABNORMAL HIGH (ref 5–15)
BUN: 23 mg/dL — ABNORMAL HIGH (ref 6–20)
CO2: 24 mmol/L (ref 22–32)
Calcium: 8.2 mg/dL — ABNORMAL LOW (ref 8.9–10.3)
Chloride: 99 mmol/L (ref 98–111)
Creatinine, Ser: 7.15 mg/dL — ABNORMAL HIGH (ref 0.61–1.24)
GFR, Estimated: 8 mL/min — ABNORMAL LOW (ref >60)
Glucose, Bld: 113 mg/dL — ABNORMAL HIGH (ref 70–99)
Potassium: 3.6 mmol/L (ref 3.5–5.1)
Sodium: 139 mmol/L (ref 135–145)
Total Bilirubin: 0.7 mg/dL (ref 0.0–1.2)
Total Protein: 6.7 g/dL (ref 6.5–8.1)

## 2024-02-22 LAB — CBC WITH DIFFERENTIAL/PLATELET
Abs Immature Granulocytes: 0.01 K/uL (ref 0.00–0.07)
Basophils Absolute: 0 K/uL (ref 0.0–0.1)
Basophils Relative: 1 %
Eosinophils Absolute: 0.1 K/uL (ref 0.0–0.5)
Eosinophils Relative: 3 %
HCT: 34.8 % — ABNORMAL LOW (ref 39.0–52.0)
Hemoglobin: 11.4 g/dL — ABNORMAL LOW (ref 13.0–17.0)
Immature Granulocytes: 0 %
Lymphocytes Relative: 11 %
Lymphs Abs: 0.5 K/uL — ABNORMAL LOW (ref 0.7–4.0)
MCH: 31.6 pg (ref 26.0–34.0)
MCHC: 32.8 g/dL (ref 30.0–36.0)
MCV: 96.4 fL (ref 80.0–100.0)
Monocytes Absolute: 0.6 K/uL (ref 0.1–1.0)
Monocytes Relative: 14 %
Neutro Abs: 2.9 K/uL (ref 1.7–7.7)
Neutrophils Relative %: 71 %
Platelets: 227 K/uL (ref 150–400)
RBC: 3.61 MIL/uL — ABNORMAL LOW (ref 4.22–5.81)
RDW: 15 % (ref 11.5–15.5)
WBC: 4.1 K/uL (ref 4.0–10.5)
nRBC: 0 % (ref 0.0–0.2)

## 2024-02-22 LAB — TROPONIN I (HIGH SENSITIVITY)
Troponin I (High Sensitivity): 34 ng/L — ABNORMAL HIGH (ref <18)
Troponin I (High Sensitivity): 40 ng/L — ABNORMAL HIGH (ref <18)

## 2024-02-22 LAB — BRAIN NATRIURETIC PEPTIDE: B Natriuretic Peptide: 2184 pg/mL — ABNORMAL HIGH (ref 0.0–100.0)

## 2024-02-22 LAB — TSH: TSH: 2.676 u[IU]/mL (ref 0.350–4.500)

## 2024-02-22 LAB — MAGNESIUM: Magnesium: 1.7 mg/dL (ref 1.7–2.4)

## 2024-02-22 MED ORDER — ADENOSINE 6 MG/2ML IV SOLN
INTRAVENOUS | Status: AC
  Administered 2024-02-22: 12 mg via INTRAVENOUS
  Filled 2024-02-22: qty 2

## 2024-02-22 MED ORDER — ADENOSINE 6 MG/2ML IV SOLN: 12.0000 mg | Freq: Once | INTRAVENOUS | Status: AC

## 2024-02-22 MED ORDER — ADENOSINE 6 MG/2ML IV SOLN: 12.0000 mg | Freq: Once | INTRAVENOUS | Status: DC

## 2024-02-22 NOTE — Discharge Instructions (Signed)
 Please follow-up closely with your cardiologist on an outpatient basis.  Return to emergency department immediately for any new or worsening symptoms.

## 2024-02-22 NOTE — ED Notes (Signed)
 Pt cardioverted w/ 12mg  of adenosine . HR in NSR w/ rate in the 90s at thsi time. Pt tolerated procedure well.

## 2024-02-22 NOTE — ED Provider Notes (Signed)
 [place] EMERGENCY DEPARTMENT AT [facility] Provider Note   CSN: [id number] Arrival date & time: 02/22/24  1318     Patient presents with: Tachycardia   Damon Williams is a 63 y.o. male.   Patient is a 63 year old male with a past medical history of chronic disease kidney disease and who is dialysis dependent who presents to the emergency department secondary to palpitations and shortness of breath.  Patient notes symptoms began approximate 30 minutes prior to arrival.  He does admit to some associated dizziness.  He states he does have a history of CHF but no other known cardiac history.  He denies any active chest pain at this time.  He has had no increased lower extremity edema.  He denies any associated syncope.  There has been no abdominal pain.  He did receive a full course of dialysis today.        Prior to Admission medications   Medication Sig Start Date End Date Taking? Authorizing Provider  acetaminophen (TYLENOL) 500 MG tablet Take 1,000 mg by mouth 2 (two) times daily as needed for moderate pain (pain score 4-6), fever or headache.    [provider]  amLODipine (NORVASC) 5 MG tablet Take 5 mg by mouth daily. 07/20/23   [provider]  calcium acetate (PHOSLO) 667 MG capsule Take 667 mg by mouth 3 (three) times daily with meals. 07/20/23   [provider]  furosemide (LASIX) 40 MG tablet Take 1 tablet (40 mg total) by mouth See admin instructions. Take 40 mg 4 times a week on non-dialysis days PRN for swelling,SOB, weight gain 03/15/23   Miriam Norris, NP  hydrALAZINE (APRESOLINE) 25 MG tablet Take 1 tablet (25 mg total) by mouth 3 (three) times daily. 02/07/23   Pearlean Manus, MD  isosorbide mononitrate (IMDUR) 30 MG 24 hr tablet Take 0.5 tablets (15 mg total) by mouth daily. 02/07/23   Pearlean Manus, MD  ondansetron (ZOFRAN) 4 MG tablet Take 1 tablet (4 mg total) by mouth every 6 (six) hours as needed for nausea or vomiting. To  take with bowel prep if nauseated 02/02/24   Eartha Flavors, Toribio, MD  telmisartan (MICARDIS) 80 MG tablet Take 80 mg by mouth daily. 06/26/23   [provider]    Allergies: Patient has no known allergies.    Review of Systems  Respiratory:  Positive for shortness of breath.   Cardiovascular:  Positive for palpitations.  All other systems reviewed and are negative.   Updated Vital Signs BP (!) 110/90   Pulse (!) 179   Resp (!) 40   Ht 5' 7 (1.702 m)   Wt 71 kg   SpO2 97%   BMI 24.52 kg/m   Physical Exam Vitals and nursing note reviewed.  Constitutional:      Appearance: Normal appearance.  HENT:     Head: Normocephalic and atraumatic.     Nose: Nose normal.     Mouth/Throat:     Mouth: Mucous membranes are moist.  Eyes:     Extraocular Movements: Extraocular movements intact.     Conjunctiva/sclera: Conjunctivae normal.     Pupils: Pupils are equal, round, and reactive to light.  Cardiovascular:     Rate and Rhythm: Regular rhythm. Tachycardia present.     Pulses: Normal pulses.     Heart sounds: Normal heart sounds. No murmur heard.    No gallop.  Pulmonary:     Effort: Pulmonary effort is normal. No respiratory distress.  Breath sounds: Normal breath sounds. No stridor. No wheezing, rhonchi or rales.  Abdominal:     General: Abdomen is flat. Bowel sounds are normal. There is no distension.     Palpations: Abdomen is soft.     Tenderness: There is no abdominal tenderness. There is no guarding.  Musculoskeletal:        General: Normal range of motion.     Cervical back: Normal range of motion and neck supple.  Skin:    General: Skin is warm and dry.  Neurological:     General: No focal deficit present.     Mental Status: He is alert and oriented to person, place, and time. Mental status is at baseline.  Psychiatric:        Mood and Affect: Mood normal.        Behavior: Behavior normal.        Thought Content: Thought content normal.         Judgment: Judgment normal.     (all labs ordered are listed, but only abnormal results are displayed) Labs Reviewed  BRAIN NATRIURETIC PEPTIDE  COMPREHENSIVE METABOLIC PANEL WITH GFR  CBC WITH DIFFERENTIAL/PLATELET  MAGNESIUM  TSH  TROPONIN I (HIGH SENSITIVITY)    EKG: None  Radiology: No results found.   .Critical Care  Performed by: Daralene Lonni BIRCH, PA-C Authorized by: Daralene Lonni BIRCH, PA-C   Critical care provider statement:    Critical care time (minutes):  35   Critical care was necessary to treat or prevent imminent or life-threatening deterioration of the following conditions: SVT.   Critical care was time spent personally by me on the following activities:  Development of treatment plan with patient or surrogate, discussions with consultants, evaluation of patient's response to treatment, examination of patient, ordering and review of laboratory studies, ordering and review of radiographic studies, ordering and performing treatments and interventions, pulse oximetry, re-evaluation of patient's condition and review of old charts   I assumed direction of critical care for this patient from another provider in my specialty: no      Medications Ordered in the ED  adenosine  (ADENOCARD ) 6 MG/2ML injection 12 mg (12 mg Intravenous Given 02/22/24 1338)                                    Medical Decision Making Amount and/or Complexity of Data Reviewed Labs: ordered. Radiology: ordered.  Risk Prescription drug management.   This patient presents to the ED for concern of shortness of breath, palpitations differential diagnosis includes ACS, pulmonary embolus, pericarditis, myocarditis, endocarditis, pneumothorax, hemothorax, pneumonia, A-fib, a flutter, SVT    Additional history obtained:  Additional history obtained from medical records External records from outside source obtained and reviewed including medical records   Lab Tests:  I Ordered,  and personally interpreted labs.  The pertinent results include: No leukocytosis, anemia at baseline, normal electrolytes, creatinine at baseline, normal liver function, stable serial troponins, BNP at baseline   Imaging Studies ordered:  I ordered imaging studies including chest x-ray I independently visualized and interpreted imaging which showed no acute cardiopulmonary process I agree with the radiologist interpretation   Medicines ordered and prescription drug management:  I ordered medication including adenosine  for SVT Reevaluation of the patient after these medicines showed that the patient resolved I have reviewed the patients home medicines and have made adjustments as needed   Problem List / ED Course:  Patient is doing well at this time and is stable for discharge home.  Heart rate is currently 88 at this time and patient notes he is completely asymptomatic and feels at his baseline.  The exact cause of his SVT is unclear and he will follow-up closely with his cardiologist on an outpatient basis.  Blood work has overall been unremarkable.  Serial troponins have remained flat.  He denies any chest pain.  Shortness of breath has resolved.  BNP is at baseline as well and he has no clinical indication for fluid overload.  He did receive dialysis today.  The importance of close follow-up was discussed as well as strict turn precautions for any new or worsening symptoms.  Patient voiced understanding and had no additional questions.   Social Determinants of Health:  None        Final diagnoses:  None    ED Discharge Orders     None          Daralene Lonni JONETTA DEVONNA 02/22/24 1636    Suzette Pac, MD 02/23/24 1455

## 2024-02-22 NOTE — ED Notes (Signed)
 Pt in SVT w/ rate of 180s. EDP at bedside to do cardioversion w/ adenison. Suction is set up, pt placed on 2L O2 W/ capnography & placed on zoll pads.

## 2024-02-22 NOTE — ED Notes (Signed)
 X-ray at bedside.

## 2024-02-22 NOTE — ED Triage Notes (Addendum)
 Pt states around 1245 today pt became SOB, dizzy and vomited twice. Pt is in SVT during triage. HR in 180s. Pt had scheduled dialysis tx today.

## 2024-02-28 ENCOUNTER — Encounter (HOSPITAL_COMMUNITY): Payer: Self-pay

## 2024-02-28 ENCOUNTER — Encounter (HOSPITAL_COMMUNITY)
Admission: RE | Admit: 2024-02-28 | Discharge: 2024-02-28 | Disposition: A | Discharge status: Home / Self Care | Source: Ambulatory Visit | Attending: Gastroenterology | Admitting: Gastroenterology

## 2024-02-28 ENCOUNTER — Other Ambulatory Visit: Payer: Self-pay

## 2024-02-28 VITALS — Ht 67.0 in | Wt 156.5 lb

## 2024-02-28 DIAGNOSIS — N186 End stage renal disease: Secondary | ICD-10-CM

## 2024-02-28 NOTE — Pre-Procedure Instructions (Signed)
 Did pre-op phone call. Patient states he did not receive information for bowel prep. I went over information with him and printed the letter for him to pick up and left it at the Main entrance for him.

## 2024-03-01 ENCOUNTER — Ambulatory Visit (HOSPITAL_COMMUNITY)
Admission: EM | Admit: 2024-03-01 | Discharge: 2024-03-01 | Disposition: A | Discharge status: Home / Self Care | Attending: Gastroenterology | Admitting: Gastroenterology

## 2024-03-01 ENCOUNTER — Encounter (HOSPITAL_COMMUNITY): Payer: Self-pay | Admitting: Gastroenterology

## 2024-03-01 ENCOUNTER — Encounter (HOSPITAL_COMMUNITY)
Admission: EM | Disposition: A | Discharge status: Home / Self Care | Payer: Self-pay | Source: Home / Self Care | Attending: Gastroenterology

## 2024-03-01 ENCOUNTER — Ambulatory Visit (HOSPITAL_COMMUNITY): Admitting: Anesthesiology

## 2024-03-01 DIAGNOSIS — Z1211 Encounter for screening for malignant neoplasm of colon: Secondary | ICD-10-CM | POA: Diagnosis present

## 2024-03-01 DIAGNOSIS — D125 Benign neoplasm of sigmoid colon: Secondary | ICD-10-CM | POA: Diagnosis not present

## 2024-03-01 DIAGNOSIS — K635 Polyp of colon: Secondary | ICD-10-CM

## 2024-03-01 DIAGNOSIS — D124 Benign neoplasm of descending colon: Secondary | ICD-10-CM | POA: Insufficient documentation

## 2024-03-01 DIAGNOSIS — F17200 Nicotine dependence, unspecified, uncomplicated: Secondary | ICD-10-CM

## 2024-03-01 DIAGNOSIS — Z992 Dependence on renal dialysis: Secondary | ICD-10-CM | POA: Insufficient documentation

## 2024-03-01 DIAGNOSIS — I5043 Acute on chronic combined systolic (congestive) and diastolic (congestive) heart failure: Secondary | ICD-10-CM | POA: Diagnosis not present

## 2024-03-01 DIAGNOSIS — F17210 Nicotine dependence, cigarettes, uncomplicated: Secondary | ICD-10-CM | POA: Diagnosis not present

## 2024-03-01 DIAGNOSIS — I120 Hypertensive chronic kidney disease with stage 5 chronic kidney disease or end stage renal disease: Secondary | ICD-10-CM | POA: Insufficient documentation

## 2024-03-01 DIAGNOSIS — I509 Heart failure, unspecified: Secondary | ICD-10-CM | POA: Diagnosis not present

## 2024-03-01 DIAGNOSIS — N186 End stage renal disease: Secondary | ICD-10-CM | POA: Diagnosis not present

## 2024-03-01 DIAGNOSIS — I110 Hypertensive heart disease with heart failure: Secondary | ICD-10-CM | POA: Diagnosis not present

## 2024-03-01 HISTORY — PX: COLONOSCOPY: SHX5424

## 2024-03-01 LAB — POCT I-STAT, CHEM 8
BUN: 32 mg/dL — ABNORMAL HIGH (ref 6–20)
Calcium, Ion: 0.9 mmol/L — ABNORMAL LOW (ref 1.15–1.40)
Chloride: 94 mmol/L — ABNORMAL LOW (ref 98–111)
Creatinine, Ser: 11 mg/dL — ABNORMAL HIGH (ref 0.61–1.24)
Glucose, Bld: 70 mg/dL (ref 70–99)
HCT: 40 % (ref 39.0–52.0)
Hemoglobin: 13.6 g/dL (ref 13.0–17.0)
Potassium: 3.8 mmol/L (ref 3.5–5.1)
Sodium: 135 mmol/L (ref 135–145)
TCO2: 28 mmol/L (ref 22–32)

## 2024-03-01 LAB — HM COLONOSCOPY

## 2024-03-01 SURGERY — COLONOSCOPY
Anesthesia: Monitor Anesthesia Care

## 2024-03-01 MED ORDER — ONDANSETRON HCL 4 MG/2ML IJ SOLN
INTRAMUSCULAR | Status: DC | PRN
  Administered 2024-03-01: 4 mg via INTRAVENOUS

## 2024-03-01 MED ORDER — PROPOFOL 10 MG/ML IV BOLUS
INTRAVENOUS | Status: AC
Start: 2024-03-01 — End: 2024-03-01
  Filled 2024-03-01: qty 40

## 2024-03-01 MED ORDER — PROPOFOL 10 MG/ML IV BOLUS: INTRAVENOUS | Status: DC | PRN

## 2024-03-01 MED ORDER — ONDANSETRON HCL 4 MG/2ML IJ SOLN
INTRAMUSCULAR | Status: AC
  Filled 2024-03-01: qty 4

## 2024-03-01 MED ORDER — PROPOFOL 10 MG/ML IV BOLUS
INTRAVENOUS | Status: DC | PRN
  Administered 2024-03-01: 100 mg via INTRAVENOUS
  Administered 2024-03-01 (×4): 50 mg via INTRAVENOUS
  Administered 2024-03-01: 100 mg via INTRAVENOUS

## 2024-03-01 MED ORDER — STERILE WATER FOR IRRIGATION IR SOLN
Status: DC | PRN
  Administered 2024-03-01: 250 mL

## 2024-03-01 MED ORDER — LACTATED RINGERS IV SOLN
INTRAVENOUS | Status: DC | PRN
Start: 2024-03-01 — End: 2024-03-01

## 2024-03-01 NOTE — Op Note (Signed)
 [facility] Patient Name: Damon Williams Procedure Date: 03/01/2024 9:11 AM MRN: [id number] Date of Birth: March 31, 1961 Attending MD: Toribio Fortune , , [id number] CSN: [id number] Age: 63 Admit Type: Outpatient Procedure:                Colonoscopy Indications:              Screening for colorectal malignant neoplasm Providers:                Toribio Fortune, Olam Ada, RN, Jon Loge Referring MD:             Toribio Fortune Medicines:                Monitored Anesthesia Care Complications:            No immediate complications. Estimated Blood Loss:     Estimated blood loss: none. Procedure:                Pre-Anesthesia Assessment:                           - Prior to the procedure, a History and Physical                            was performed, and patient medications, allergies                            and sensitivities were reviewed. The patient's                            tolerance of previous anesthesia was reviewed.                           - The risks and benefits of the procedure and the                            sedation options and risks were discussed with the                            patient. All questions were answered and informed                            consent was obtained.                           - ASA Grade Assessment: III - A patient with severe                            systemic disease.                           After obtaining informed consent, the colonoscope                            was passed under direct vision. Throughout the                            procedure, the patient's  blood pressure, pulse, and                            oxygen saturations were monitored continuously. The                            [id number] ([id number]) was introduced through the                            anus and advanced to the the cecum, identified by                            appendiceal orifice and ileocecal valve. The                             colonoscopy was performed without difficulty. The                            patient tolerated the procedure well. The quality                            of the bowel preparation was good. Scope In: 9:29:39 AM Scope Out: 9:59:13 AM Scope Withdrawal Time: 0 hours 21 minutes 13 seconds  Total Procedure Duration: 0 hours 29 minutes 34 seconds  Findings:      The perianal and digital rectal examinations were normal.      A 5 mm polyp was found in the descending colon. The polyp was sessile.       The polyp was removed with a cold snare. Resection and retrieval were       complete.      Three sessile polyps were found in the sigmoid colon. The polyps were 3       to 8 mm in size. These polyps were removed with a cold snare. Resection       and retrieval were complete.      The retroflexed view of the distal rectum and anal verge was normal and       showed no anal or rectal abnormalities. Impression:               - One 5 mm polyp in the descending colon, removed                            with a cold snare. Resected and retrieved.                           - Three 3 to 8 mm polyps in the sigmoid colon,                            removed with a cold snare. Resected and retrieved.                           - The distal rectum and anal verge are normal on                            retroflexion view.  Moderate Sedation:      Per Anesthesia Care Recommendation:           - Discharge patient to home (ambulatory).                           - Resume previous diet.                           - Await pathology results.                           - Repeat colonoscopy for surveillance based on                            pathology results. Procedure Code(s):        --- Professional ---                           [phone], Colonoscopy, flexible; with removal of                            tumor(s), polyp(s), or other lesion(s) by snare                            technique Diagnosis Code(s):        ---  Professional ---                           Z12.11, Encounter for screening for malignant                            neoplasm of colon                           D12.4, Benign neoplasm of descending colon                           D12.5, Benign neoplasm of sigmoid colon CPT copyright 2022 [employer]. All rights reserved. The codes documented in this report are preliminary and upon coder review may  be revised to meet current compliance requirements. Toribio Fortune, MD Toribio Fortune,  03/01/2024 10:05:18 AM This report has been signed electronically. Number of Addenda: 0

## 2024-03-01 NOTE — Discharge Instructions (Signed)
 You are being discharged to home.  Resume your previous diet.  We are waiting for your pathology results.  Your physician has recommended a repeat colonoscopy for surveillance based on pathology results.

## 2024-03-01 NOTE — H&P (Signed)
 Damon Williams is an 63 y.o. male.   Chief Complaint: Colorectal cancer screening HPI: 63 year old male with past medical history of ESRD on HD, HTN, CKD, cardiomyopathy, coming for screening colonoscopy. The patient has never had a colonoscopy in the past.  The patient denies having any complaints such as melena, hematochezia, abdominal pain or distention, change in her bowel movement consistency or frequency, no changes in weight recently.  No family history of colorectal cancer.   Past Medical History:  Diagnosis Date   Cardiomyopathy (HCC)    CKD    Cocaine  use    ESRD (end stage renal disease) on dialysis (HCC) 12/05/2022   Hypertension    Tobacco abuse     Past Surgical History:  Procedure Laterality Date   AV FISTULA PLACEMENT Left 12/27/2022   Procedure: INSERTION OF LEFT ARM  ARTERIOVENOUS FISTULA;  Surgeon: Oris Krystal FALCON, MD;  Location: [facility];  Service: Vascular;  Laterality: Left;   BACK SURGERY     IR FLUORO GUIDE CV LINE RIGHT  12/07/2022   IR US  GUIDE VASC ACCESS RIGHT  12/07/2022    Family History  Problem Relation Age of Onset   Heart disease Neg Hx    Social History:  reports that he has been smoking cigarettes. He has a 30 pack-year smoking history. He quit smokeless tobacco use about 17 months ago. He reports that he does not currently use alcohol. He reports current drug use. Drug: Cocaine .  Allergies: No Known Allergies  Medications Prior to Admission  Medication Sig Dispense Refill   acetaminophen  (TYLENOL ) 500 MG tablet Take 1,000 mg by mouth 2 (two) times daily as needed for moderate pain (pain score 4-6), fever or headache.     amLODipine  (NORVASC ) 5 MG tablet Take 5 mg by mouth daily.     calcium  acetate (PHOSLO ) 667 MG capsule Take 667 mg by mouth 3 (three) times daily with meals.     furosemide  (LASIX ) 40 MG tablet Take 1 tablet (40 mg total) by mouth See admin instructions. Take 40 mg 4 times a week on non-dialysis days PRN for swelling,SOB, weight  gain 30 tablet 5   hydrALAZINE  (APRESOLINE ) 25 MG tablet Take 1 tablet (25 mg total) by mouth 3 (three) times daily. 90 tablet 3   hydrOXYzine  (ATARAX ) 25 MG tablet Take 25 mg by mouth every 12 (twelve) hours as needed for anxiety or itching.     isosorbide  mononitrate (ISMO ) 10 MG tablet Take 10 mg by mouth daily.     losartan (COZAAR) 50 MG tablet Take 50 mg by mouth daily.     ondansetron  (ZOFRAN ) 4 MG tablet Take 1 tablet (4 mg total) by mouth every 6 (six) hours as needed for nausea or vomiting. To take with bowel prep if nauseated 4 tablet 0   polyethylene glycol-electrolytes (NULYTELY ) 420 g solution Take 4,000 mLs by mouth once.     telmisartan (MICARDIS) 80 MG tablet Take 80 mg by mouth daily.      No results found for this or any previous visit (from the past 48 hours). No results found.  Review of Systems  All other systems reviewed and are negative.   There were no vitals taken for this visit. Physical Exam  GENERAL: The patient is AO x3, in no acute distress. HEENT: Head is normocephalic and atraumatic. EOMI are intact. Mouth is well hydrated and without lesions. NECK: Supple. No masses LUNGS: Clear to auscultation. No presence of rhonchi/wheezing/rales. Adequate chest expansion HEART: RRR, normal  s1 and s2. ABDOMEN: Soft, nontender, no guarding, no peritoneal signs, and nondistended. BS +. No masses. EXTREMITIES: Without any cyanosis, clubbing, rash, lesions or edema. NEUROLOGIC: AOx3, no focal motor deficit. SKIN: no jaundice, no rashes  Assessment/Plan 63 year old male with past medical history of ESRD on HD, HTN, CKD, cardiomyopathy, coming for screening colonoscopy. The patient is at average risk for colorectal cancer.  We will proceed with colonoscopy today.   Toribio Eartha Flavors, MD 03/01/2024, 9:04 AM

## 2024-03-01 NOTE — Anesthesia Preprocedure Evaluation (Signed)
 Anesthesia Evaluation  Patient identified by MRN, date of birth, ID band Patient awake    Reviewed: Allergy & Precautions, H&P , NPO status , Patient's Chart, lab work & pertinent test results, reviewed documented beta blocker date and time   Airway Mallampati: II  TM Distance: >3 FB Neck ROM: full    Dental no notable dental hx.    Pulmonary neg pulmonary ROS, Current Smoker   Pulmonary exam normal breath sounds clear to auscultation       Cardiovascular Exercise Tolerance: Good hypertension, +CHF   Rhythm:regular Rate:Normal     Neuro/Psych negative neurological ROS  negative psych ROS   GI/Hepatic negative GI ROS,,,(+)     substance abuse  cocaine  use  Endo/Other  negative endocrine ROS    Renal/GU Renal disease  negative genitourinary   Musculoskeletal   Abdominal   Peds  Hematology  (+) Blood dyscrasia, anemia   Anesthesia Other Findings Recent chemical cardioversion for SVT - secondary to paint fumes  Recent negative toxicology (02/05/24).  Reproductive/Obstetrics negative OB ROS                              Anesthesia Physical Anesthesia Plan  ASA: 3  Anesthesia Plan: General   Post-op Pain Management:    Induction:   PONV Risk Score and Plan: Propofol  infusion  Airway Management Planned:   Additional Equipment:   Intra-op Plan:   Post-operative Plan:   Informed Consent: I have reviewed the patients History and Physical, chart, labs and discussed the procedure including the risks, benefits and alternatives for the proposed anesthesia with the patient or authorized representative who has indicated his/her understanding and acceptance.     Dental Advisory Given  Plan Discussed with: CRNA  Anesthesia Plan Comments:         Anesthesia Quick Evaluation

## 2024-03-01 NOTE — Transfer of Care (Signed)
 Immediate Anesthesia Transfer of Care Note  Patient: Damon Williams  Procedure(s) Performed: COLONOSCOPY  Patient Location: PACU  Anesthesia Type:MAC  Level of Consciousness: awake and drowsy  Airway & Oxygen Therapy: Patient Spontanous Breathing  Post-op Assessment: Report given to RN and Patient moving all extremities X 4  Post vital signs: Reviewed and stable  Last Vitals:  Vitals Value Taken Time  BP    Temp    Pulse    Resp    SpO2      Last Pain: There were no vitals filed for this visit.       Complications: No notable events documented.

## 2024-03-04 ENCOUNTER — Encounter (INDEPENDENT_AMBULATORY_CARE_PROVIDER_SITE_OTHER): Payer: Self-pay | Admitting: *Deleted

## 2024-03-04 LAB — SURGICAL PATHOLOGY

## 2024-03-05 ENCOUNTER — Ambulatory Visit: Payer: Self-pay | Admitting: Gastroenterology

## 2024-03-09 NOTE — Anesthesia Postprocedure Evaluation (Signed)
 Anesthesia Post Note  Patient: CAM DAUPHIN  Procedure(s) Performed: COLONOSCOPY  Patient location during evaluation: Phase II Anesthesia Type: MAC Level of consciousness: awake Pain management: pain level controlled Vital Signs Assessment: post-procedure vital signs reviewed and stable Respiratory status: spontaneous breathing and respiratory function stable Cardiovascular status: blood pressure returned to baseline and stable Postop Assessment: no headache and no apparent nausea or vomiting Anesthetic complications: no Comments: Late entry   No notable events documented.   Last Vitals:  Vitals:   03/01/24 1004  BP: 109/63  Pulse: 72  Resp: 19  Temp: 36.4 C  SpO2: 100%    Last Pain:  Vitals:   03/04/24 1605  TempSrc:   PainSc: 0-No pain                 Yvonna JINNY Bosworth

## 2024-03-12 DIAGNOSIS — Z992 Dependence on renal dialysis: Secondary | ICD-10-CM | POA: Diagnosis not present

## 2024-03-12 DIAGNOSIS — N186 End stage renal disease: Secondary | ICD-10-CM | POA: Diagnosis not present

## 2024-03-18 ENCOUNTER — Encounter (INDEPENDENT_AMBULATORY_CARE_PROVIDER_SITE_OTHER): Payer: Self-pay | Admitting: *Deleted

## 2024-03-18 NOTE — Progress Notes (Signed)
 5 yr TCS noted in recall Patient result letter mailed procedure note and pathology result faxed to PCP

## 2024-03-19 DIAGNOSIS — Z992 Dependence on renal dialysis: Secondary | ICD-10-CM | POA: Diagnosis not present

## 2024-03-21 DIAGNOSIS — Z992 Dependence on renal dialysis: Secondary | ICD-10-CM | POA: Diagnosis not present

## 2024-03-23 DIAGNOSIS — Z992 Dependence on renal dialysis: Secondary | ICD-10-CM | POA: Diagnosis not present

## 2024-03-26 DIAGNOSIS — N186 End stage renal disease: Secondary | ICD-10-CM | POA: Diagnosis not present

## 2024-03-30 DIAGNOSIS — Z992 Dependence on renal dialysis: Secondary | ICD-10-CM | POA: Diagnosis not present

## 2024-03-30 DIAGNOSIS — N186 End stage renal disease: Secondary | ICD-10-CM | POA: Diagnosis not present

## 2024-04-05 ENCOUNTER — Encounter: Payer: Self-pay | Admitting: Nurse Practitioner

## 2024-04-05 ENCOUNTER — Telehealth: Payer: Self-pay | Admitting: Nurse Practitioner

## 2024-04-05 ENCOUNTER — Ambulatory Visit: Attending: Nurse Practitioner | Admitting: Nurse Practitioner

## 2024-04-05 ENCOUNTER — Other Ambulatory Visit: Payer: Self-pay | Admitting: Nurse Practitioner

## 2024-04-05 ENCOUNTER — Other Ambulatory Visit

## 2024-04-05 VITALS — BP 148/98 | HR 102 | Ht 68.0 in | Wt 163.4 lb

## 2024-04-05 DIAGNOSIS — N179 Acute kidney failure, unspecified: Secondary | ICD-10-CM

## 2024-04-05 DIAGNOSIS — I5023 Acute on chronic systolic (congestive) heart failure: Secondary | ICD-10-CM

## 2024-04-05 DIAGNOSIS — N1832 Chronic kidney disease, stage 3b: Secondary | ICD-10-CM

## 2024-04-05 DIAGNOSIS — I471 Supraventricular tachycardia, unspecified: Secondary | ICD-10-CM

## 2024-04-05 DIAGNOSIS — I10 Essential (primary) hypertension: Secondary | ICD-10-CM

## 2024-04-05 DIAGNOSIS — N186 End stage renal disease: Secondary | ICD-10-CM

## 2024-04-05 DIAGNOSIS — M7989 Other specified soft tissue disorders: Secondary | ICD-10-CM

## 2024-04-05 DIAGNOSIS — I428 Other cardiomyopathies: Secondary | ICD-10-CM

## 2024-04-05 DIAGNOSIS — I77819 Aortic ectasia, unspecified site: Secondary | ICD-10-CM

## 2024-04-05 DIAGNOSIS — I340 Nonrheumatic mitral (valve) insufficiency: Secondary | ICD-10-CM

## 2024-04-05 DIAGNOSIS — I429 Cardiomyopathy, unspecified: Secondary | ICD-10-CM | POA: Diagnosis not present

## 2024-04-05 DIAGNOSIS — I3139 Other pericardial effusion (noninflammatory): Secondary | ICD-10-CM

## 2024-04-05 DIAGNOSIS — F1991 Other psychoactive substance use, unspecified, in remission: Secondary | ICD-10-CM

## 2024-04-05 NOTE — Progress Notes (Addendum)
 Cardiology Office Note   Date:  04/05/2024 ID:  Damon Williams, DOB [birth year], MRN [id number] PCP: Orpha Yancey LABOR, MD  [facility] Providers Cardiologist:  Alvan Carrier, MD     History of Present Illness Damon Williams is a 63 y.o. male with a PMH of HTN, ESRD, hx of polysubstance abuse (quit recently) (past cocaine  user, former smoker, former ETOH abuse), SVT, pericardial effusion, MR, aortic dilatation, and cardiomyopathy, who is seen today for follow-up. History of frequent ED visits and hospitalizations noted below.  TTE 10/2022 revealed EF 30-35%.   In the interim, he has had multiple ED visits/hospital admissions.   Most recently hospitalized in January 2025 for hyperkalemia. Missed 2 sessions of HD. Did not take his medications for at least 4 days, d/t dropping medications in the toilet, had not requested refills. Admitted to stable leg edema. Hospital course noted with significantly elevated liver enzymes. RUQ US  was negative for gallstones or ductal dilations. Hepatitis panel negative. Outpt GI follow-up arranged.   I last saw him for telehealth follow-up on 08/15/2023. Told me he had his good days and not so good days.  Has sometimes that he feels short of breath that is related to when he doesn't adhere to his fluid restrictions, says this improves usually on dialysis. BP at home is well controlled. Says his weight is coming back on. Does admit to stable leg edema, says this has improved than it was before. Continues to stay sober from cocaine . Occasionally smokes, says he is trying not to smoke and has not drank alcohol since 1999. Denies any chest pain,  palpitations, syncope, presyncope, dizziness, orthopnea, PND, significant weight changes, acute bleeding, or claudication.  ED visit on September 10, 2023 due to pleural effusion, he had chief complaint of shortness of breath in setting of missed dialysis.  Chest x-ray revealed small pleural effusions.  Plan was for  dialysis that day.  ED visit on February 22, 2024 due to palpitations, tachycardia.  He also noted shortness of breath with his symptoms.  Did admit to some associated dizziness, denied any active chest pain or syncope.  Heart rate was found to be over 150 and was found in SVT.  He did receive adenosine  while in the ED.  CXR negative for anything acute.  BNP 2,184. Trop 34 >> 40. Underwent colonoscopy on 03/01/2024.   Today he presents for ED follow-up. He states he is doing much better. He recently got right with God and was baptized, and tells me he has quit smoking and using cocaine . 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 Denies any chest pain, shortness of breath, syncope, presyncope, dizziness, orthopnea, PND, swelling or significant weight changes, acute bleeding, or claudication.  Family History: Hypercholesterolemia: Mother and Father Stroke: Paternal Grandfather HTN: Brother and Sister  ROS:   Please see the history of present illness.    All other systems reviewed and are negative.  Studies Reviewed EKG Interpretation Date/Time:  Friday April 05 2024 10:00:07 EDT Ventricular Rate:  98 PR Interval:  160 QRS Duration:  100 QT Interval:  392 QTC Calculation: 500 R Axis:   80  Text Interpretation: Normal sinus rhythm Possible Left atrial enlargement Left ventricular hypertrophy with repolarization abnormality ( Sokolow-Lyon , Cornell product , Romhilt-Estes ) Prolonged QT When compared with ECG of 22-Feb-2024 13:39, PREVIOUS ECG IS PRESENT Confirmed by Miriam Norris (  [id number]) on 04/05/2024 10:03:01 AM    Echo 10/2022: 1. Left ventricular ejection fraction, by estimation, is 30 to 35%. The  left ventricle has moderately decreased function. The left ventricle  demonstrates global hypokinesis. The left ventricular internal cavity size  was mildly to moderately dilated.  There is mild  concentric left ventricular hypertrophy. Left ventricular  diastolic parameters are consistent with Grade II diastolic dysfunction  (pseudonormalization).   2. Right ventricular systolic function is low normal. The right  ventricular size is normal. Tricuspid regurgitation signal is inadequate  for assessing PA pressure.   3. Left atrial size was moderately dilated.   4. Right atrial size was mildly dilated.   5. A small pericardial effusion is present. The pericardial effusion is posterior to the left ventricle.   6. The mitral valve is grossly normal, mildly thickened. Mild mitral  valve regurgitation.   7. The aortic valve is tricuspid. Aortic valve regurgitation is not  visualized.   8. Aortic dilatation noted. There is moderate dilatation of the aortic  root, measuring 43 mm.   9. The inferior vena cava is dilated in size with >50% respiratory  variability, suggesting right atrial pressure of 8 mmHg.   Comparison(s): No prior Echocardiogram. Consider hypertensive versus infiltrative cardiomyopathy.  Risk Assessment/Calculations     The 10-year ASCVD risk score (Arnett DK, et al., 2019) is: 28.4%   Values used to calculate the score:     Age: 58 years     Clincally relevant sex: Male     Is Non-Hispanic African American: Yes     Diabetic: No     Tobacco smoker: Yes     Systolic Blood Pressure: 148 mmHg     Is BP treated: Yes     HDL Cholesterol: 31 mg/dL     Total Cholesterol: 130 mg/dL  Physical Exam VS:  BP (!) 148/98   Pulse (!) 102   Ht 5' 8 (1.727 m)   Wt 163 lb 6.4 oz (74.1 kg)   SpO2 99%   BMI 24.84 kg/m        Wt Readings from Last 3 Encounters:  04/05/24 163 lb 6.4 oz (74.1 kg)  02/28/24 156 lb 8.4 oz (71 kg)  02/22/24 156 lb 8.4 oz (71 kg)    GEN: Well nourished, well developed in no acute distress NECK: No JVD; No carotid bruits CARDIAC: S1/S2, RRR, no murmurs, rubs, gallops RESPIRATORY:  Clear to auscultation without rales, wheezing or rhonchi   ABDOMEN: Soft, non-tender, non-distended EXTREMITIES:  No edema; No deformity   ASSESSMENT AND PLAN  Acute on chronic systolic CHF, Hypertensive cardiomyopathy, NICM Diagnosed 10/2022. Stage C, NYHA class I symptoms. TTE revealed EF 30-35%, grade 2 DD, was recommended to consider hypertensive vs infiltrative CM. No FH of CHF in his family per his report. GDMT limited d/t ESRD. Continue hydralazine , Lasix , telmisartan, and Imdur .  Low sodium diet, fluid restriction <2L, and daily weights encouraged. Educated to contact our office for weight gain of 2 lbs overnight or 5 lbs in one week. No longer using cocaine , I congratulated him. Previously referred to cardiac rehab. Continue HD as scheduled. Care and ED precautions discussed. Will restart Lasix  as his last BNP was over 2,000 and will update Echo at this time and obtain BNP, CMET and Mag level.   SVT Denies any recent tachycardia or palpitations recently. Presented to ED in August 2025 for symptomatic sustained SVT, received adenosine . 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 EKG today shows HR 98 bpm on EKG, NSR. Will arrange 14 day Zio for further evaluation.  Obtaining labs as noted above. Depending on results, may need to consider low dose Toprol XL at follow-up. Heart healthy diet and regular cardiovascular exercise encouraged. Care and ED precautions discussed.   HTN States BP well controlled at home. Discussed to monitor BP at home at least 2 hours after medications and sitting for 5-10 minutes. Goal SBP < 130. Continue medication regimen. Low salt, heart healthy diet encouraged.   3. Pericardial effusion Small pericardial effusion noted on past TTE, pt denies any red flag symptoms.  ED precautions discussed. Updating Echo at this time.   4. Mitral regurgitation Mild mitral regurgitation noted on TTE. Will update Echo.   5. Aortic dilatation Moderate  dilatation of aortic root at 43 mm. Will update Echo at this time. Care and ED precautions discussed. Heart healthy diet encouraged.   6. ESRD on HD Currently on a Tues/Thurs/Sat schedule for HD. No medication changes at this time. Continue to follow-up with Dr. Rachele.   7. Hx of drug use Congratulated him on stopping all drugs since I have last seen him and has remained sober since 1989.     Medication Adjustments/Labs and Tests Ordered: Current medicines are reviewed at length with the patient today.  Concerns regarding medicines are outlined above.   Tests Ordered: Orders Placed This Encounter  Procedures   Comprehensive Metabolic Panel (CMET)   Magnesium   Brain natriuretic peptide   EKG 12-Lead   ECHOCARDIOGRAM COMPLETE    Medication Changes: No orders of the defined types were placed in this encounter.   Follow Up:  In Person in 6-8 week(s)  Signed, Almarie Crate, NP

## 2024-04-05 NOTE — Telephone Encounter (Signed)
 Checking percert on the following   LONG TERM MONITOR-LIVE TELEMETRY (3-14 DAYS)

## 2024-04-05 NOTE — Patient Instructions (Addendum)
 Medication Instructions:  Your physician has recommended you make the following change in your medication:  Please restart Lasix  as prescribed.   Labwork: In 1 week at [facility]   Testing/Procedures: Your physician has requested that you have an echocardiogram. Echocardiography is a painless test that uses sound waves to create images of your heart. It provides your doctor with information about the size and shape of your heart and how well your heart's chambers and valves are working. This procedure takes approximately one hour. There are no restrictions for this procedure. Please do NOT wear cologne, perfume, aftershave, or lotions (deodorant is allowed). Please arrive 15 minutes prior to your appointment time.  Please note: We ask at that you not bring children with you during ultrasound (echo/ vascular) testing. Due to room size and safety concerns, children are not allowed in the ultrasound rooms during exams. Our front office staff cannot provide observation of children in our lobby area while testing is being conducted. An adult accompanying a patient to their appointment will only be allowed in the ultrasound room at the discretion of the ultrasound technician under special circumstances. We apologize for any inconvenience. ZIO- Long Term Monitor Instructions   Your physician has requested you wear your ZIO patch monitor 14 days.   This is a single patch monitor.  [employer] supplies one patch monitor per enrollment.  Additional stickers are not available.   Please do not apply patch if you will be having a Nuclear Stress Test, Echocardiogram, Cardiac CT, MRI, or Chest Xray during the time frame you would be wearing the monitor. The patch cannot be worn during these tests.  You cannot remove and re-apply the ZIO XT patch monitor.   Your ZIO patch monitor will be sent USPS Priority mail from [facility] directly to your home address. The monitor may also be mailed to a PO BOX  if home delivery is not available.   It may take 3-5 days to receive your monitor after you have been enrolled.   Once you have received you monitor, please review enclosed instructions.  Your monitor has already been registered assigning a specific monitor serial # to you.   Applying the monitor   Shave hair from upper left chest.   Hold abrader disc by orange tab.  Rub abrader in 40 strokes over left upper chest as indicated in your monitor instructions.   Clean area with 4 enclosed alcohol pads .  Use all pads to assure are is cleaned thoroughly.  Let dry.   Apply patch as indicated in monitor instructions.  Patch will be place under collarbone on left side of chest with arrow pointing upward.   Rub patch adhesive wings for 2 minutes.Remove white label marked 1.  Remove white label marked 2.  Rub patch adhesive wings for 2 additional minutes.   While looking in a mirror, press and release button in center of patch.  A small green light will flash 3-4 times .  This will be your only indicator the monitor has been turned on.     Do not shower for the first 24 hours.  You may shower after the first 24 hours.   Press button if you feel a symptom. You will hear a small click.  Record Date, Time and Symptom in the Patient Log Book.   When you are ready to remove patch, follow instructions on last 2 pages of Patient Log Book.  Stick patch monitor onto last page of Patient Log  Book.   Place Patient Log Book in [place] box.  Use locking tab on box and tape box closed securely.  The [employer] has [employer] on it.  Please place in mailbox as soon as possible.  Your physician should have your test results approximately 7 days after the monitor has been mailed back to [facility].   Call [facility] Customer Care at [phone] if you have questions regarding your ZIO XT patch monitor.  Call them immediately if you see an orange light blinking on your monitor.   If your  monitor falls off in less than 4 days contact our Monitor department at [phone].  If your monitor becomes loose or falls off after 4 days call [employer] at [phone] for suggestions on securing your monitor.  Follow-Up: Your physician recommends that you schedule a follow-up appointment in: 6-8 weeks   Any Other Special Instructions Will Be Listed Below (If Applicable).  If you need a refill on your cardiac medications before your next appointment, please call your pharmacy.

## 2024-04-06 DIAGNOSIS — Z992 Dependence on renal dialysis: Secondary | ICD-10-CM | POA: Diagnosis not present

## 2024-04-09 DIAGNOSIS — Z992 Dependence on renal dialysis: Secondary | ICD-10-CM | POA: Diagnosis not present

## 2024-04-09 DIAGNOSIS — N186 End stage renal disease: Secondary | ICD-10-CM | POA: Diagnosis not present

## 2024-04-11 DIAGNOSIS — N186 End stage renal disease: Secondary | ICD-10-CM | POA: Diagnosis not present

## 2024-04-11 DIAGNOSIS — Z992 Dependence on renal dialysis: Secondary | ICD-10-CM | POA: Diagnosis not present

## 2024-04-11 DIAGNOSIS — Z23 Encounter for immunization: Secondary | ICD-10-CM | POA: Diagnosis not present

## 2024-04-13 DIAGNOSIS — Z23 Encounter for immunization: Secondary | ICD-10-CM | POA: Diagnosis not present

## 2024-04-13 DIAGNOSIS — N186 End stage renal disease: Secondary | ICD-10-CM | POA: Diagnosis not present

## 2024-04-13 DIAGNOSIS — Z992 Dependence on renal dialysis: Secondary | ICD-10-CM | POA: Diagnosis not present

## 2024-04-16 DIAGNOSIS — Z992 Dependence on renal dialysis: Secondary | ICD-10-CM | POA: Diagnosis not present

## 2024-04-16 DIAGNOSIS — N186 End stage renal disease: Secondary | ICD-10-CM | POA: Diagnosis not present

## 2024-04-16 DIAGNOSIS — Z23 Encounter for immunization: Secondary | ICD-10-CM | POA: Diagnosis not present

## 2024-04-17 DIAGNOSIS — N186 End stage renal disease: Secondary | ICD-10-CM | POA: Diagnosis not present

## 2024-04-17 DIAGNOSIS — Z992 Dependence on renal dialysis: Secondary | ICD-10-CM | POA: Diagnosis not present

## 2024-04-17 DIAGNOSIS — Z23 Encounter for immunization: Secondary | ICD-10-CM | POA: Diagnosis not present

## 2024-04-18 DIAGNOSIS — Z23 Encounter for immunization: Secondary | ICD-10-CM | POA: Diagnosis not present

## 2024-04-18 DIAGNOSIS — Z992 Dependence on renal dialysis: Secondary | ICD-10-CM | POA: Diagnosis not present

## 2024-04-18 DIAGNOSIS — N186 End stage renal disease: Secondary | ICD-10-CM | POA: Diagnosis not present

## 2024-04-20 DIAGNOSIS — Z23 Encounter for immunization: Secondary | ICD-10-CM | POA: Diagnosis not present

## 2024-04-20 DIAGNOSIS — N186 End stage renal disease: Secondary | ICD-10-CM | POA: Diagnosis not present

## 2024-04-20 DIAGNOSIS — Z992 Dependence on renal dialysis: Secondary | ICD-10-CM | POA: Diagnosis not present

## 2024-04-23 DIAGNOSIS — N186 End stage renal disease: Secondary | ICD-10-CM | POA: Diagnosis not present

## 2024-04-23 DIAGNOSIS — Z23 Encounter for immunization: Secondary | ICD-10-CM | POA: Diagnosis not present

## 2024-04-23 DIAGNOSIS — Z992 Dependence on renal dialysis: Secondary | ICD-10-CM | POA: Diagnosis not present

## 2024-04-24 ENCOUNTER — Ambulatory Visit: Attending: Nurse Practitioner

## 2024-04-24 DIAGNOSIS — I5023 Acute on chronic systolic (congestive) heart failure: Secondary | ICD-10-CM | POA: Diagnosis not present

## 2024-04-24 LAB — ECHOCARDIOGRAM COMPLETE
AR max vel: 2.37 cm2
AV Peak grad: 6.2 mmHg
Ao pk vel: 1.24 m/s
Area-P 1/2: 6.54 cm2
Calc EF: 30.1 %
MV M vel: 4.48 m/s
MV Peak grad: 80.3 mmHg
S' Lateral: 5.5 cm
Single Plane A2C EF: 34.4 %
Single Plane A4C EF: 33.8 %

## 2024-04-25 DIAGNOSIS — N186 End stage renal disease: Secondary | ICD-10-CM | POA: Diagnosis not present

## 2024-04-25 DIAGNOSIS — Z131 Encounter for screening for diabetes mellitus: Secondary | ICD-10-CM | POA: Diagnosis not present

## 2024-04-25 DIAGNOSIS — Z23 Encounter for immunization: Secondary | ICD-10-CM | POA: Diagnosis not present

## 2024-04-25 DIAGNOSIS — Z992 Dependence on renal dialysis: Secondary | ICD-10-CM | POA: Diagnosis not present

## 2024-04-27 DIAGNOSIS — Z23 Encounter for immunization: Secondary | ICD-10-CM | POA: Diagnosis not present

## 2024-04-27 DIAGNOSIS — Z992 Dependence on renal dialysis: Secondary | ICD-10-CM | POA: Diagnosis not present

## 2024-04-27 DIAGNOSIS — N186 End stage renal disease: Secondary | ICD-10-CM | POA: Diagnosis not present

## 2024-04-30 DIAGNOSIS — Z23 Encounter for immunization: Secondary | ICD-10-CM | POA: Diagnosis not present

## 2024-04-30 DIAGNOSIS — Z992 Dependence on renal dialysis: Secondary | ICD-10-CM | POA: Diagnosis not present

## 2024-04-30 DIAGNOSIS — N186 End stage renal disease: Secondary | ICD-10-CM | POA: Diagnosis not present

## 2024-05-01 ENCOUNTER — Other Ambulatory Visit: Payer: Self-pay | Admitting: Nurse Practitioner

## 2024-05-02 DIAGNOSIS — Z992 Dependence on renal dialysis: Secondary | ICD-10-CM | POA: Diagnosis not present

## 2024-05-02 DIAGNOSIS — N186 End stage renal disease: Secondary | ICD-10-CM | POA: Diagnosis not present

## 2024-05-02 DIAGNOSIS — Z23 Encounter for immunization: Secondary | ICD-10-CM | POA: Diagnosis not present

## 2024-05-04 DIAGNOSIS — Z992 Dependence on renal dialysis: Secondary | ICD-10-CM | POA: Diagnosis not present

## 2024-05-04 DIAGNOSIS — Z23 Encounter for immunization: Secondary | ICD-10-CM | POA: Diagnosis not present

## 2024-05-04 DIAGNOSIS — N186 End stage renal disease: Secondary | ICD-10-CM | POA: Diagnosis not present

## 2024-05-07 DIAGNOSIS — Z992 Dependence on renal dialysis: Secondary | ICD-10-CM | POA: Diagnosis not present

## 2024-05-07 DIAGNOSIS — Z23 Encounter for immunization: Secondary | ICD-10-CM | POA: Diagnosis not present

## 2024-05-07 DIAGNOSIS — N186 End stage renal disease: Secondary | ICD-10-CM | POA: Diagnosis not present

## 2024-05-08 ENCOUNTER — Telehealth: Payer: Self-pay | Admitting: *Deleted

## 2024-05-08 NOTE — Telephone Encounter (Signed)
 Reports mailing monitor almost 1 & 1/2 week ago. Gave number to call [employer] [phone]) to give status of monitor for tracking purposes.

## 2024-05-08 NOTE — Telephone Encounter (Signed)
 Email received from [employer] 05/06/2024:Overdue Pending Return Monitors These monitors are over 30 days past the day they were registered and we do not have the monitor to upload. We want to make you aware that if these monitors do not make it to us  by the 45th day after they were registered, we will consider them lost and the system will mark them as such. Please reach out to your patients to encourage them to return their Zio patch as soon as possible. If the monitor comes in at any time after it is marked lost, we will upload the data and provide you the report.

## 2024-05-09 DIAGNOSIS — N186 End stage renal disease: Secondary | ICD-10-CM | POA: Diagnosis not present

## 2024-05-09 DIAGNOSIS — Z23 Encounter for immunization: Secondary | ICD-10-CM | POA: Diagnosis not present

## 2024-05-09 DIAGNOSIS — Z992 Dependence on renal dialysis: Secondary | ICD-10-CM | POA: Diagnosis not present

## 2024-05-16 ENCOUNTER — Encounter (INDEPENDENT_AMBULATORY_CARE_PROVIDER_SITE_OTHER): Payer: Self-pay | Admitting: Gastroenterology

## 2024-05-27 ENCOUNTER — Telehealth: Payer: Self-pay | Admitting: Nurse Practitioner

## 2024-05-27 ENCOUNTER — Ambulatory Visit: Attending: Nurse Practitioner | Admitting: Nurse Practitioner

## 2024-05-27 ENCOUNTER — Encounter: Payer: Self-pay | Admitting: Nurse Practitioner

## 2024-05-27 VITALS — BP 138/82 | HR 102 | Ht 68.0 in | Wt 153.6 lb

## 2024-05-27 DIAGNOSIS — I38 Endocarditis, valve unspecified: Secondary | ICD-10-CM

## 2024-05-27 DIAGNOSIS — R002 Palpitations: Secondary | ICD-10-CM

## 2024-05-27 DIAGNOSIS — I5023 Acute on chronic systolic (congestive) heart failure: Secondary | ICD-10-CM

## 2024-05-27 DIAGNOSIS — I429 Cardiomyopathy, unspecified: Secondary | ICD-10-CM | POA: Diagnosis not present

## 2024-05-27 DIAGNOSIS — I428 Other cardiomyopathies: Secondary | ICD-10-CM | POA: Diagnosis not present

## 2024-05-27 DIAGNOSIS — N186 End stage renal disease: Secondary | ICD-10-CM

## 2024-05-27 DIAGNOSIS — R9431 Abnormal electrocardiogram [ECG] [EKG]: Secondary | ICD-10-CM

## 2024-05-27 DIAGNOSIS — I272 Pulmonary hypertension, unspecified: Secondary | ICD-10-CM

## 2024-05-27 DIAGNOSIS — I471 Supraventricular tachycardia, unspecified: Secondary | ICD-10-CM

## 2024-05-27 DIAGNOSIS — R000 Tachycardia, unspecified: Secondary | ICD-10-CM

## 2024-05-27 DIAGNOSIS — I10 Essential (primary) hypertension: Secondary | ICD-10-CM

## 2024-05-27 DIAGNOSIS — F1991 Other psychoactive substance use, unspecified, in remission: Secondary | ICD-10-CM

## 2024-05-27 DIAGNOSIS — I3139 Other pericardial effusion (noninflammatory): Secondary | ICD-10-CM

## 2024-05-27 NOTE — Patient Instructions (Addendum)
 Medication Instructions:   Telmisartan (Micardis) removed from list today  Talk with pcp about stopping the Atarax  as this medication can cause changes on your EKG Continue all other medications.     Labwork:  Get labs done as requested - orders given again today   Testing/Procedures:  Your physician has recommended that you have a pulmonary function test. Pulmonary Function Tests are a group of tests that measure how well air moves in and out of your lungs.   Follow-Up:  6-8 weeks   Any Other Special Instructions Will Be Listed Below (If Applicable).  Nurse visit in 1 week for EKG  Handicap placard application given today   If you need a refill on your cardiac medications before your next appointment, please call your pharmacy.

## 2024-05-27 NOTE — Progress Notes (Unsigned)
 Cardiology Office Note   Date:  04/05/2024 ID:  Damon Williams, DOB December 15, 1960, MRN [id number] PCP: Orpha Yancey LABOR, MD  [facility] Providers Cardiologist:  Alvan Carrier, MD     History of Present Illness Damon Williams is a 63 y.o. male with a PMH of HTN, ESRD, hx of polysubstance abuse (quit recently) (past cocaine  user, former smoker, former ETOH abuse), SVT, pericardial effusion, MR, aortic dilatation, and cardiomyopathy, who is seen today for follow-up. History of frequent ED visits and hospitalizations noted below.  TTE 10/2022 revealed EF 30-35%.   In the interim, he has had multiple ED visits/hospital admissions.   Most recently hospitalized in January 2025 for hyperkalemia. Missed 2 sessions of HD. Did not take his medications for at least 4 days, d/t dropping medications in the toilet, had not requested refills. Admitted to stable leg edema. Hospital course noted with significantly elevated liver enzymes. RUQ US  was negative for gallstones or ductal dilations. Hepatitis panel negative. Outpt GI follow-up arranged.   I last saw him for telehealth follow-up on 08/15/2023. Told me he had his good days and not so good days.  Has sometimes that he feels short of breath that is related to when he doesn't adhere to his fluid restrictions, says this improves usually on dialysis. BP at home is well controlled. Says his weight is coming back on. Does admit to stable leg edema, says this has improved than it was before. Continues to stay sober from cocaine . Occasionally smokes, says he is trying not to smoke and has not drank alcohol since 1999. Denies any chest pain,  palpitations, syncope, presyncope, dizziness, orthopnea, PND, significant weight changes, acute bleeding, or claudication.  ED visit on September 10, 2023 due to pleural effusion, he had chief complaint of shortness of breath in setting of missed dialysis.  Chest x-ray revealed small pleural effusions.  Plan was for  dialysis that day.  ED visit on February 22, 2024 due to palpitations, tachycardia.  He also noted shortness of breath with his symptoms.  Did admit to some associated dizziness, denied any active chest pain or syncope.  Heart rate was found to be over 150 and was found in SVT.  He did receive adenosine  while in the ED.  CXR negative for anything acute.  BNP 2,184. Trop 34 >> 40. Underwent colonoscopy on 03/01/2024.   Today he presents for ED follow-up. He states he is doing much better. He recently got right with God and was baptized, and tells me he has quit smoking and using cocaine . 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 Denies any chest pain, shortness of breath, syncope, presyncope, dizziness, orthopnea, PND, swelling or significant weight changes, acute bleeding, or claudication.  Family History: Hypercholesterolemia: Mother and Father Stroke: Paternal Grandfather HTN: Brother and Sister  ROS:   Please see the history of present illness.    All other systems reviewed and are negative.  Studies Reviewed      Echo 10/2022: 1. Left ventricular ejection fraction, by estimation, is 30 to 35%. The  left ventricle has moderately decreased function. The left ventricle  demonstrates global hypokinesis. The left ventricular internal cavity size  was mildly to moderately dilated.  There is mild concentric left ventricular hypertrophy. Left ventricular  diastolic parameters are consistent with Grade II diastolic dysfunction  (pseudonormalization).   2. Right ventricular systolic function is  low normal. The right  ventricular size is normal. Tricuspid regurgitation signal is inadequate  for assessing PA pressure.   3. Left atrial size was moderately dilated.   4. Right atrial size was mildly dilated.   5. A small pericardial effusion is present. The pericardial effusion is posterior to the left  ventricle.   6. The mitral valve is grossly normal, mildly thickened. Mild mitral  valve regurgitation.   7. The aortic valve is tricuspid. Aortic valve regurgitation is not  visualized.   8. Aortic dilatation noted. There is moderate dilatation of the aortic  root, measuring 43 mm.   9. The inferior vena cava is dilated in size with >50% respiratory  variability, suggesting right atrial pressure of 8 mmHg.   Comparison(s): No prior Echocardiogram. Consider hypertensive versus infiltrative cardiomyopathy.  Risk Assessment/Calculations     The 10-year ASCVD risk score (Arnett DK, et al., 2019) is: 16.1%   Values used to calculate the score:     Age: 7 years     Clincally relevant sex: Male     Is Non-Hispanic African American: Yes     Diabetic: No     Tobacco smoker: No     Systolic Blood Pressure: 138 mmHg     Is BP treated: Yes     HDL Cholesterol: 31 mg/dL     Total Cholesterol: 130 mg/dL  Physical Exam VS:  BP 138/82   Pulse (!) 102   Ht 5' 8 (1.727 m)   Wt 153 lb 9.6 oz (69.7 kg)   SpO2 96%   BMI 23.35 kg/m        Wt Readings from Last 3 Encounters:  05/27/24 153 lb 9.6 oz (69.7 kg)  04/05/24 163 lb 6.4 oz (74.1 kg)  02/28/24 156 lb 8.4 oz (71 kg)    GEN: Well nourished, well developed in no acute distress NECK: No JVD; No carotid bruits CARDIAC: S1/S2, RRR, no murmurs, rubs, gallops RESPIRATORY:  Clear to auscultation without rales, wheezing or rhonchi  ABDOMEN: Soft, non-tender, non-distended EXTREMITIES:  No edema; No deformity   ASSESSMENT AND PLAN  Acute on chronic systolic CHF, Hypertensive cardiomyopathy, NICM Diagnosed 10/2022. Stage C, NYHA class I symptoms. TTE revealed EF 30-35%, grade 2 DD, was recommended to consider hypertensive vs infiltrative CM. No FH of CHF in his family per his report. GDMT limited d/t ESRD. Continue hydralazine , Lasix , telmisartan, and Imdur .  Low sodium diet, fluid restriction <2L, and daily weights encouraged. Educated to  contact our office for weight gain of 2 lbs overnight or 5 lbs in one week. No longer using cocaine , I congratulated him. Previously referred to cardiac rehab. Continue HD as scheduled. Care and ED precautions discussed. Will restart Lasix  as his last BNP was over 2,000 and will update Echo at this time and obtain BNP, CMET and Mag level.   SVT Denies any recent tachycardia or palpitations recently. Presented to ED in August 2025 for symptomatic sustained SVT, received adenosine . No recurrences in SVT per patient's report, but did not HR went up while spray painting with no mask on, has not recurred since. EKG today shows HR 98 bpm on EKG, NSR. Will arrange 14 day Zio for further evaluation.  Obtaining labs as noted above. Depending on results, may need to consider low dose Toprol XL at follow-up. Heart healthy diet and regular cardiovascular exercise encouraged. Care and ED precautions discussed.   HTN States BP well controlled at home. Discussed to monitor BP at home at least  2 hours after medications and sitting for 5-10 minutes. Goal SBP < 130. Continue medication regimen. Low salt, heart healthy diet encouraged.   3. Pericardial effusion Small pericardial effusion noted on past TTE, pt denies any red flag symptoms.  ED precautions discussed. Updating Echo at this time.   4. Mitral regurgitation Mild mitral regurgitation noted on TTE. Will update Echo.   5. Aortic dilatation Moderate dilatation of aortic root at 43 mm. Will update Echo at this time. Care and ED precautions discussed. Heart healthy diet encouraged.   6. ESRD on HD Currently on a Tues/Thurs/Sat schedule for HD. No medication changes at this time. Continue to follow-up with Dr. Rachele.   7. Hx of drug use Congratulated him on stopping all drugs since I have last seen him and has remained sober since 1989.     Medication Adjustments/Labs and Tests Ordered: Current medicines are reviewed at length with the patient today.   Concerns regarding medicines are outlined above.   Tests Ordered: No orders of the defined types were placed in this encounter.   Medication Changes: No orders of the defined types were placed in this encounter.   Follow Up:  In Person in 6-8 week(s)  Signed, Almarie Crate, NP

## 2024-05-27 NOTE — Telephone Encounter (Signed)
 Checking percert on the following patient for testing scheduled [facility]  PFT'S 06-05-24

## 2024-05-28 ENCOUNTER — Other Ambulatory Visit (HOSPITAL_COMMUNITY)
Admission: RE | Admit: 2024-05-28 | Discharge: 2024-05-28 | Disposition: A | Discharge status: Home / Self Care | Source: Ambulatory Visit | Attending: Nurse Practitioner | Admitting: Nurse Practitioner

## 2024-05-28 DIAGNOSIS — N186 End stage renal disease: Secondary | ICD-10-CM | POA: Diagnosis present

## 2024-05-28 DIAGNOSIS — I5023 Acute on chronic systolic (congestive) heart failure: Secondary | ICD-10-CM | POA: Diagnosis present

## 2024-05-28 LAB — COMPREHENSIVE METABOLIC PANEL WITH GFR
ALT: 23 U/L (ref 0–44)
AST: 19 U/L (ref 15–41)
Albumin: 3.7 g/dL (ref 3.5–5.0)
Alkaline Phosphatase: 79 U/L (ref 38–126)
Anion gap: 16 — ABNORMAL HIGH (ref 5–15)
BUN: 55 mg/dL — ABNORMAL HIGH (ref 8–23)
CO2: 27 mmol/L (ref 22–32)
Calcium: 7.9 mg/dL — ABNORMAL LOW (ref 8.9–10.3)
Chloride: 96 mmol/L — ABNORMAL LOW (ref 98–111)
Creatinine, Ser: 11.9 mg/dL — ABNORMAL HIGH (ref 0.61–1.24)
GFR, Estimated: 4 mL/min — ABNORMAL LOW (ref >60)
Glucose, Bld: 88 mg/dL (ref 70–99)
Potassium: 5 mmol/L (ref 3.5–5.1)
Sodium: 139 mmol/L (ref 135–145)
Total Bilirubin: 0.5 mg/dL (ref 0.0–1.2)
Total Protein: 6.7 g/dL (ref 6.5–8.1)

## 2024-05-28 LAB — MAGNESIUM: Magnesium: 2.3 mg/dL (ref 1.7–2.4)

## 2024-05-28 LAB — PRO BRAIN NATRIURETIC PEPTIDE: Pro Brain Natriuretic Peptide: >35000 pg/mL — ABNORMAL HIGH (ref <300.0)

## 2024-05-30 DIAGNOSIS — I471 Supraventricular tachycardia, unspecified: Secondary | ICD-10-CM

## 2024-06-03 ENCOUNTER — Ambulatory Visit

## 2024-06-03 MED ORDER — CARVEDILOL 3.125 MG PO TABS: 3.1250 mg | ORAL_TABLET | Freq: Two times a day (BID) | ORAL | 6 refills | Status: DC

## 2024-06-03 NOTE — Addendum Note (Signed)
 Addended by: Garrette Caine G on: 06/03/2024 12:58 PM   Modules accepted: Orders

## 2024-06-03 NOTE — Progress Notes (Signed)
 Patient notified and verbalized understanding.   Will send new medication into [facility] [address] in [place] now - Coreg  3.125mg  twice a day.    States he recognized this name Carvedilol  - thinks he may have been on this in the past, but not sure why it was ever stopped.  Does not remember.   States that he stopped the Hydroxyzine  (Atarax ) the same day of the visit.  Has not been in touch with his pcp yet.    Missed nurse visit this morning for EKG but stated he didn't know anything about that.  This was scheduled upon his check out when last seen by provider.   Suggested that he log BP till next OV & bring to visit with him on 07/10/24.

## 2024-06-04 ENCOUNTER — Other Ambulatory Visit: Payer: Self-pay

## 2024-06-04 ENCOUNTER — Observation Stay (HOSPITAL_COMMUNITY)
Admission: EM | Admit: 2024-06-04 | Discharge: 2024-06-06 | Disposition: A | Discharge status: Home / Self Care | Attending: Internal Medicine | Admitting: Internal Medicine

## 2024-06-04 ENCOUNTER — Emergency Department (HOSPITAL_COMMUNITY)
Admission: EM | Admit: 2024-06-04 | Discharge: 2024-06-04 | Disposition: A | Discharge status: Home / Self Care | Attending: Emergency Medicine | Admitting: Emergency Medicine

## 2024-06-04 ENCOUNTER — Emergency Department (HOSPITAL_COMMUNITY)

## 2024-06-04 ENCOUNTER — Encounter (HOSPITAL_COMMUNITY): Payer: Self-pay | Admitting: Emergency Medicine

## 2024-06-04 DIAGNOSIS — I132 Hypertensive heart and chronic kidney disease with heart failure and with stage 5 chronic kidney disease, or end stage renal disease: Secondary | ICD-10-CM | POA: Insufficient documentation

## 2024-06-04 DIAGNOSIS — I471 Supraventricular tachycardia, unspecified: Secondary | ICD-10-CM | POA: Insufficient documentation

## 2024-06-04 DIAGNOSIS — J810 Acute pulmonary edema: Secondary | ICD-10-CM

## 2024-06-04 DIAGNOSIS — N186 End stage renal disease: Secondary | ICD-10-CM | POA: Insufficient documentation

## 2024-06-04 DIAGNOSIS — Z87891 Personal history of nicotine dependence: Secondary | ICD-10-CM | POA: Insufficient documentation

## 2024-06-04 DIAGNOSIS — J9601 Acute respiratory failure with hypoxia: Principal | ICD-10-CM | POA: Insufficient documentation

## 2024-06-04 DIAGNOSIS — Z79899 Other long term (current) drug therapy: Secondary | ICD-10-CM | POA: Insufficient documentation

## 2024-06-04 DIAGNOSIS — I470 Re-entry ventricular arrhythmia: Secondary | ICD-10-CM | POA: Diagnosis not present

## 2024-06-04 DIAGNOSIS — E875 Hyperkalemia: Secondary | ICD-10-CM | POA: Diagnosis present

## 2024-06-04 DIAGNOSIS — R0602 Shortness of breath: Secondary | ICD-10-CM | POA: Diagnosis present

## 2024-06-04 DIAGNOSIS — I3139 Other pericardial effusion (noninflammatory): Secondary | ICD-10-CM | POA: Insufficient documentation

## 2024-06-04 DIAGNOSIS — I5043 Acute on chronic combined systolic (congestive) and diastolic (congestive) heart failure: Secondary | ICD-10-CM | POA: Insufficient documentation

## 2024-06-04 DIAGNOSIS — Z992 Dependence on renal dialysis: Secondary | ICD-10-CM | POA: Insufficient documentation

## 2024-06-04 DIAGNOSIS — I5023 Acute on chronic systolic (congestive) heart failure: Principal | ICD-10-CM | POA: Diagnosis present

## 2024-06-04 DIAGNOSIS — D631 Anemia in chronic kidney disease: Secondary | ICD-10-CM | POA: Insufficient documentation

## 2024-06-04 DIAGNOSIS — I10 Essential (primary) hypertension: Secondary | ICD-10-CM | POA: Insufficient documentation

## 2024-06-04 DIAGNOSIS — R059 Cough, unspecified: Secondary | ICD-10-CM | POA: Insufficient documentation

## 2024-06-04 LAB — BASIC METABOLIC PANEL WITH GFR
Anion gap: 14 (ref 5–15)
BUN: 55 mg/dL — ABNORMAL HIGH (ref 8–23)
CO2: 28 mmol/L (ref 22–32)
Calcium: 7.9 mg/dL — ABNORMAL LOW (ref 8.9–10.3)
Chloride: 99 mmol/L (ref 98–111)
Creatinine, Ser: 10.5 mg/dL — ABNORMAL HIGH (ref 0.61–1.24)
GFR, Estimated: 5 mL/min — ABNORMAL LOW (ref >60)
Glucose, Bld: 77 mg/dL (ref 70–99)
Potassium: 4.3 mmol/L (ref 3.5–5.1)
Sodium: 140 mmol/L (ref 135–145)

## 2024-06-04 LAB — CBC WITH DIFFERENTIAL/PLATELET
Abs Immature Granulocytes: 0.01 K/uL (ref 0.00–0.07)
Basophils Absolute: 0 K/uL (ref 0.0–0.1)
Basophils Relative: 1 %
Eosinophils Absolute: 0.1 K/uL (ref 0.0–0.5)
Eosinophils Relative: 3 %
HCT: 25.9 % — ABNORMAL LOW (ref 39.0–52.0)
Hemoglobin: 8.5 g/dL — ABNORMAL LOW (ref 13.0–17.0)
Immature Granulocytes: 0 %
Lymphocytes Relative: 10 %
Lymphs Abs: 0.4 K/uL — ABNORMAL LOW (ref 0.7–4.0)
MCH: 31.6 pg (ref 26.0–34.0)
MCHC: 32.8 g/dL (ref 30.0–36.0)
MCV: 96.3 fL (ref 80.0–100.0)
Monocytes Absolute: 0.3 K/uL (ref 0.1–1.0)
Monocytes Relative: 8 %
Neutro Abs: 3.1 K/uL (ref 1.7–7.7)
Neutrophils Relative %: 78 %
Platelets: 293 K/uL (ref 150–400)
RBC: 2.69 MIL/uL — ABNORMAL LOW (ref 4.22–5.81)
RDW: 14.8 % (ref 11.5–15.5)
WBC: 4 K/uL (ref 4.0–10.5)
nRBC: 0 % (ref 0.0–0.2)

## 2024-06-04 LAB — TROPONIN T, HIGH SENSITIVITY
Troponin T High Sensitivity: 218 ng/L (ref 0–19)
Troponin T High Sensitivity: 209 ng/L (ref 0–19)

## 2024-06-04 LAB — RESP PANEL BY RT-PCR (RSV, FLU A&B, COVID)  RVPGX2
Influenza A by PCR: NEGATIVE
Influenza B by PCR: NEGATIVE
Resp Syncytial Virus by PCR: NEGATIVE
SARS Coronavirus 2 by RT PCR: NEGATIVE

## 2024-06-04 LAB — LACTIC ACID, PLASMA: Lactic Acid, Venous: 1 mmol/L (ref 0.5–1.9)

## 2024-06-04 LAB — PRO BRAIN NATRIURETIC PEPTIDE: Pro Brain Natriuretic Peptide: >35000 pg/mL — ABNORMAL HIGH (ref <300.0)

## 2024-06-04 LAB — MAGNESIUM: Magnesium: 2.1 mg/dL (ref 1.7–2.4)

## 2024-06-04 MED ORDER — AZITHROMYCIN 250 MG PO TABS: 250.0000 mg | ORAL_TABLET | Freq: Every day | ORAL | 0 refills | Status: DC

## 2024-06-04 MED ORDER — FUROSEMIDE 10 MG/ML IJ SOLN
60.0000 mg | Freq: Once | INTRAMUSCULAR | Status: AC
  Administered 2024-06-05: 60 mg via INTRAVENOUS
  Filled 2024-06-04: qty 6

## 2024-06-04 MED ORDER — ADENOSINE 6 MG/2ML IV SOLN
INTRAVENOUS | Status: AC
  Administered 2024-06-04: 6 mg via INTRAVENOUS
  Filled 2024-06-04: qty 6

## 2024-06-04 MED ORDER — IOHEXOL 350 MG/ML SOLN
75.0000 mL | Freq: Once | INTRAVENOUS | Status: AC | PRN
  Administered 2024-06-04: 75 mL via INTRAVENOUS

## 2024-06-04 MED ORDER — ADENOSINE 6 MG/2ML IV SOLN: 6.0000 mg | Freq: Once | INTRAVENOUS | Status: AC

## 2024-06-04 MED ORDER — SODIUM CHLORIDE 0.9 % IV BOLUS
250.0000 mL | Freq: Once | INTRAVENOUS | Status: AC
  Administered 2024-06-04: 250 mL via INTRAVENOUS

## 2024-06-04 MED ORDER — HYDROCODONE BIT-HOMATROP MBR 5-1.5 MG/5ML PO SOLN
5.0000 mL | Freq: Once | ORAL | Status: AC
  Administered 2024-06-04: 5 mL via ORAL
  Filled 2024-06-04: qty 5

## 2024-06-04 NOTE — ED Notes (Signed)
 Pt stated he is unable to produce urine, notified RN Rock Law

## 2024-06-04 NOTE — ED Triage Notes (Signed)
 Pt arrived [facility] from dialysis with c/o tachycardia. Pt received 1.5 hrs of dialysis today. EDP at bedside.

## 2024-06-04 NOTE — ED Notes (Signed)
 ED Provider at bedside.

## 2024-06-04 NOTE — ED Notes (Signed)
 Pt stated he does not produce urine for us  to obtain a urine sample due to dialysis.

## 2024-06-04 NOTE — ED Notes (Signed)
 Patient transported to X-ray

## 2024-06-04 NOTE — ED Notes (Signed)
 Pt requesting cough meds. EDP aware.

## 2024-06-04 NOTE — ED Notes (Signed)
 Family at bedside.

## 2024-06-04 NOTE — Discharge Instructions (Addendum)
 It was a pleasure caring for you today in the emergency department.  Be sure to follow-up with your cardiologist tomorrow.  Be sure to pick up your prescription for carvedilol  at the pharmacy.  Be sure to follow-up with your next dialysis appointment on Thursday.  Please continue taking your Lasix  as prescribed.  Be sure to get plenty of rest over the next couple days, avoid stimulants such as excessive caffeine.  Please return  Please return to the emergency department for any worsening or worrisome symptoms.

## 2024-06-04 NOTE — ED Provider Notes (Signed)
 [place] EMERGENCY DEPARTMENT AT [facility] Provider Note  CSN: [id number] Arrival date & time: 06/04/24 9261  Chief Complaint(s) Tachycardia  HPI Damon Williams is a 63 y.o. male with past medical history as below, significant for cocaine  use, ESRD, CHF, cardiomegaly, cardiomyopathy who presents to the ED with complaint of tachycardia   Patient was at dialysis this morning, received around half of the session and was noted to be in difficult tachycardia and was sent to the ER for evaluation.  Patient reports been having cough over the past couple days, productive with yellow or green-colored mucus at times.  Difficulty breathing this morning.  Chest tightness.  No significant chest pain.  No abdominal pain nausea or vomiting past few days.  He still does make some urine and feels his urine output has been unchanged the past couple days.  Denies any missed dialysis over the past 2 weeks.  Past Medical History Past Medical History:  Diagnosis Date   Cardiomyopathy (HCC)    CKD    Cocaine  use    ESRD (end stage renal disease) on dialysis (HCC) 12/05/2022   Hypertension    Tobacco abuse    Patient Active Problem List   Diagnosis Date Noted   Acute on chronic HFrEF (heart failure with reduced ejection fraction) (HCC) 06/05/2024   Essential hypertension 06/05/2024   Special screening for malignant neoplasms, colon 03/01/2024   Acute hyperkalemia 07/25/2023   ESRD (end stage renal disease) (HCC) 07/25/2023   Pulmonary edema 02/06/2023   Pleural effusion on right 02/06/2023   CHF exacerbation (HCC) 01/28/2023   Acute on chronic combined systolic and diastolic CHF (congestive heart failure) (HCC) 01/28/2023   Acute respiratory failure with hypoxia (HCC) 01/27/2023   Uremia 12/05/2022   ESRD on hemodialysis (HCC) 12/05/2022   AKI (acute kidney injury) 12/04/2022   Hyperkalemia 12/04/2022   Metabolic acidosis 12/04/2022   Cardiomyopathy (HCC) 11/05/2022   Acute renal  failure (ARF) 11/04/2022   Cardiomegaly 11/04/2022   Uncontrolled hypertension 11/04/2022   Anemia in chronic kidney disease (CKD) 11/04/2022   CKD (chronic kidney disease) 11/04/2022   Current smoker 11/04/2022   Home Medication(s) Prior to Admission medications   Medication Sig Start Date End Date Taking? Authorizing Provider  acetaminophen  (TYLENOL ) 500 MG tablet Take 1,000 mg by mouth 2 (two) times daily as needed for moderate pain (pain score 4-6), fever or headache.   Yes [provider]  amLODipine  (NORVASC ) 5 MG tablet Take 5 mg by mouth daily. 07/20/23  Yes [provider]  azithromycin  (ZITHROMAX ) 250 MG tablet Take 1 tablet (250 mg total) by mouth daily. Take first 2 tablets together, then 1 every day until finished. 06/04/24  Yes Elnor Savant A, DO  calcium  acetate (PHOSLO ) 667 MG capsule Take 667 mg by mouth 3 (three) times daily with meals. 07/20/23  Yes [provider]  furosemide  (LASIX ) 40 MG tablet Take 1 tablet (40 mg total) by mouth See admin instructions. Take 40 mg 4 times a week on non-dialysis days PRN for swelling,SOB, weight gain 03/15/23  Yes Miriam Norris, NP  hydrALAZINE  (APRESOLINE ) 25 MG tablet Take 1 tablet (25 mg total) by mouth 3 (three) times daily. Patient taking differently: Take 25 mg by mouth daily. 02/07/23  Yes Emokpae, Courage, MD  isosorbide  mononitrate (ISMO ) 10 MG tablet Take 10 mg by mouth daily. 01/29/24  Yes [provider]  losartan (COZAAR) 50 MG tablet Take 50 mg by mouth daily. 02/07/24  Yes [provider]  carvedilol  (COREG ) 3.125 MG tablet Take 1 tablet (3.125 mg total) by mouth 2 (two) times daily. 06/03/24   Miriam Norris, NP                                                                                                                                    Past Surgical History Past Surgical History:  Procedure Laterality Date   AV FISTULA PLACEMENT Left 12/27/2022   Procedure: INSERTION OF LEFT ARM   ARTERIOVENOUS FISTULA;  Surgeon: Oris Krystal FALCON, MD;  Location: [facility];  Service: Vascular;  Laterality: Left;   BACK SURGERY     COLONOSCOPY N/A 03/01/2024   Procedure: COLONOSCOPY;  Surgeon: Eartha Angelia Sieving, MD;  Location: [facility];  Service: Gastroenterology;  Laterality: N/A;  10:15 am, asa 3  dialysis on T,Th & Sat   IR FLUORO GUIDE CV LINE RIGHT  12/07/2022   IR US  GUIDE VASC ACCESS RIGHT  12/07/2022   Family History Family History  Problem Relation Age of Onset   Heart disease Neg Hx     Social History Social History   Tobacco Use   Smoking status: Former    Current packs/day: 0.00    Average packs/day: 1 pack/day for 30.0 years (30.0 ttl pk-yrs)    Types: Cigarettes    Quit date: 09/2023    Years since quitting: 0.7   Smokeless tobacco: Former    Quit date: 09/2022  Vaping Use   Vaping status: Never Used  Substance Use Topics   Alcohol use: Not Currently    Comment: quit 1989   Drug use: Yes    Types: Cocaine     Comment: Pt states- he has been clean for 3 months as of 02/28/2024.   Allergies Patient has no known allergies.  Review of Systems A thorough review of systems was obtained and all systems are negative except as noted in the HPI and PMH.   Physical Exam Vital Signs  I have reviewed the triage vital signs BP 122/82   Pulse (!) 102   Temp 98.2 F (36.8 C) (Oral)   Resp (!) 21   Ht 5' 8 (1.727 m)   Wt 69.7 kg   SpO2 98%   BMI 23.35 kg/m  Physical Exam Vitals and nursing note reviewed.  Constitutional:      Appearance: He is well-developed. He is not ill-appearing.  HENT:     Head: Normocephalic and atraumatic.     Right Ear: External ear normal.     Left Ear: External ear normal.     Mouth/Throat:     Mouth: Mucous membranes are moist.  Eyes:     General: No scleral icterus. Cardiovascular:     Rate and Rhythm: Regular rhythm. Tachycardia present.     Pulses: Normal pulses.     Heart sounds: Normal heart sounds.   Pulmonary:     Effort: Pulmonary effort is normal. No respiratory distress.  Breath sounds: Normal breath sounds.  Abdominal:     General: Abdomen is flat.     Palpations: Abdomen is soft.     Tenderness: There is no abdominal tenderness.  Musculoskeletal:     Cervical back: No rigidity.     Right lower leg: No edema.     Left lower leg: No edema.  Skin:    General: Skin is warm and dry.     Capillary Refill: Capillary refill takes less than 2 seconds.      Neurological:     Mental Status: He is alert.  Psychiatric:        Mood and Affect: Mood normal.        Behavior: Behavior normal.     ED Results and Treatments Labs (all labs ordered are listed, but only abnormal results are displayed) Labs Reviewed  CBC WITH DIFFERENTIAL/PLATELET - Abnormal; Notable for the following components:      Result Value   RBC 2.69 (*)    Hemoglobin 8.5 (*)    HCT 25.9 (*)    Lymphs Abs 0.4 (*)    All other components within normal limits  BASIC METABOLIC PANEL WITH GFR - Abnormal; Notable for the following components:   BUN 55 (*)    Creatinine, Ser 10.50 (*)    Calcium  7.9 (*)    GFR, Estimated 5 (*)    All other components within normal limits  PRO BRAIN NATRIURETIC PEPTIDE - Abnormal; Notable for the following components:   Pro Brain Natriuretic Peptide >35,000.0 (*)    All other components within normal limits  TROPONIN T, HIGH SENSITIVITY - Abnormal; Notable for the following components:   Troponin T High Sensitivity 218 (*)    All other components within normal limits  TROPONIN T, HIGH SENSITIVITY - Abnormal; Notable for the following components:   Troponin T High Sensitivity 209 (*)    All other components within normal limits  RESP PANEL BY RT-PCR (RSV, FLU A&B, COVID)  RVPGX2  CULTURE, BLOOD (SINGLE)  MAGNESIUM  LACTIC ACID, PLASMA                                                                                                                          Radiology No  results found.  Pertinent labs & imaging results that were available during my care of the patient were reviewed by me and considered in my medical decision making (see MDM for details).  Medications Ordered in ED Medications  adenosine  (ADENOCARD ) 6 MG/2ML injection 6 mg (6 mg Intravenous Given 06/04/24 0805)  HYDROcodone  bit-homatropine (HYCODAN) 5-1.5 MG/5ML syrup 5 mL (5 mLs Oral Given 06/04/24 0924)  sodium chloride  0.9 % bolus 250 mL (0 mLs Intravenous Stopped 06/04/24 1045)  iohexol  (OMNIPAQUE ) 350 MG/ML injection 75 mL (75 mLs Intravenous Contrast Given 06/04/24 1119)  Procedures .Critical Care  Performed by: Elnor Jayson LABOR, DO Authorized by: Elnor Jayson LABOR, DO   Critical care provider statement:    Critical care time (minutes):  30   Critical care time was exclusive of:  Separately billable procedures and treating other patients   Critical care was necessary to treat or prevent imminent or life-threatening deterioration of the following conditions:  Cardiac failure   Critical care was time spent personally by me on the following activities:  Development of treatment plan with patient or surrogate, discussions with consultants, evaluation of patient's response to treatment, examination of patient, ordering and review of laboratory studies, ordering and review of radiographic studies, ordering and performing treatments and interventions, pulse oximetry, re-evaluation of patient's condition, review of old charts and obtaining history from patient or surrogate .Cardioversion  Date/Time: 06/04/2024 8:16 AM  Performed by: Elnor Jayson LABOR, DO Authorized by: Elnor Jayson LABOR, DO   Consent:    Consent obtained:  Verbal   Consent given by:  Patient   Risks discussed:  Induced arrhythmia   Alternatives discussed:  No treatment and rate-control  medication Pre-procedure details:    Cardioversion basis:  Emergent   Rhythm:  Supraventricular tachycardia Patient sedated: No Attempt one:    Cardioversion mode attempt one: adenosine .   Shock outcome:  Conversion to other rhythm (sinus tachycardia) Post-procedure details:    Patient status:  Awake   Patient tolerance of procedure:  Tolerated well, no immediate complications   (including critical care time)  Medical Decision Making / ED Course    Medical Decision Making:    DEITRICH STEVE is a 63 y.o. male with past medical history as below, significant for cocaine  use, ESRD, CHF, cardiomegaly, cardiomyopathy who presents to the ED with complaint of tachycardia . The complaint involves an extensive differential diagnosis and also carries with it a high risk of complications and morbidity.  Serious etiology was considered. Ddx includes but is not limited to: SVT, A-fib RVR, WPW, sinus tachycardia, infection, dehydration, substance use, electrolyte derangement, etc.  Complete initial physical exam performed, notably the patient was in mild distress, SVT.    Reviewed and confirmed nursing documentation for past medical history, family history, social history.  Vital signs reviewed.    SVT> - History of similar, he has no acute chest pain but is having chest tightness.  Has some URI type symptoms for the past couple days - Heart rate 160s to 170s, blood pressure stable.  He is agreeable to chemical cardioversion - Patient given adenosine  6 mg, conversion to sinus tachycardia; he remains hemodynamically stable.  Reports precipitous improvement to his symptoms following cardioversion - trop elev, likely 2/2 demand ischemia, flat. No ongoing cp. He has appt w/ cards tomorrow per pt - he has hx cocaine  use, avoid stimulants, advised to resume coreg  which he has not been taking  Cough> - he has cough last couple days, not productive - imaging w/o infection - given anti-tussive here,  will give empiric abx, ?atypical infection. He is at elevated risk for infxn   ESRD on HD> - pt received only 1/2 session this AM, stopped early 2/2 elev HR - initially was dyspneic but after SVT resolved reports feeling substantially better - evidence of fluid on chest imaging, he is not requiring O2, denies any current dib or cp. - encouraged to continue lasix  at home, f/u at his next HD session   Clinical Course as of 06/06/24 0755  Tue Jun 04, 2024  1141 Troponin  T High Sensitivity(!!): 209 Trop 218 > 209, favor demand ischemia in setting of SVT, ESRD [SG]  1229 CTPE stable, shows cardiomegaly, pt has no current dyspnea, does not appear volume overloaded. This is likely 2/2 ESRD status  [SG]  1230 Pt reports feeling back to normal [SG]  1346 Pt reports he was supposed to start taking coreg  but never picked up rx [SG]    Clinical Course User Index [SG] Elnor Jayson LABOR, DO      I have discussed the diagnosis/risks/treatment options with the patient.  Evaluation and diagnostic testing in the emergency department does not suggest an emergent condition requiring admission or immediate intervention beyond what has been performed at this time.  They will follow up with PCP, cardiology (appt tomorrow per pt). We also discussed returning to the ED immediately if new or worsening sx occur. We discussed the sx which are most concerning (e.g., sudden worsening pain, fever, inability to tolerate by mouth) that necessitate immediate return.    The patient appears reasonably screened and/or stabilized for discharge and I doubt any other medical condition or other [facility] requiring further screening, evaluation, or treatment in the ED at this time prior to discharge.                 Additional history obtained: -Additional history obtained from na -External records from outside source obtained and reviewed including: Chart review including previous notes, labs, imaging, consultation notes  including  Prior er eval Prior labs   Lab Tests: -I ordered, reviewed, and interpreted labs.   The pertinent results include:   Labs Reviewed  CBC WITH DIFFERENTIAL/PLATELET - Abnormal; Notable for the following components:      Result Value   RBC 2.69 (*)    Hemoglobin 8.5 (*)    HCT 25.9 (*)    Lymphs Abs 0.4 (*)    All other components within normal limits  BASIC METABOLIC PANEL WITH GFR - Abnormal; Notable for the following components:   BUN 55 (*)    Creatinine, Ser 10.50 (*)    Calcium  7.9 (*)    GFR, Estimated 5 (*)    All other components within normal limits  PRO BRAIN NATRIURETIC PEPTIDE - Abnormal; Notable for the following components:   Pro Brain Natriuretic Peptide >35,000.0 (*)    All other components within normal limits  TROPONIN T, HIGH SENSITIVITY - Abnormal; Notable for the following components:   Troponin T High Sensitivity 218 (*)    All other components within normal limits  TROPONIN T, HIGH SENSITIVITY - Abnormal; Notable for the following components:   Troponin T High Sensitivity 209 (*)    All other components within normal limits  RESP PANEL BY RT-PCR (RSV, FLU A&B, COVID)  RVPGX2  CULTURE, BLOOD (SINGLE)  MAGNESIUM  LACTIC ACID, PLASMA    Notable for as above  EKG   EKG Interpretation Date/Time:  Tuesday June 04 2024 07:50:18 EST Ventricular Rate:  171 PR Interval:  23 QRS Duration:  95 QT Interval:  301 QTC Calculation: 508 R Axis:   30  Text Interpretation: Supraventricular tachycardia LVH with secondary repolarization abnormality Anterior Q waves, possibly due to LVH ST depression, probably rate related Confirmed by Elnor Jayson ([id number]) on 06/04/2024 8:22:17 AM         Imaging Studies ordered: I ordered imaging studies including CXR CTPE I independently visualized the following imaging with scope of interpretation limited to determining acute life threatening conditions related to emergency care; findings noted above  I agree  with the radiologist interpretation If any imaging was obtained with contrast I closely monitored patient for any possible adverse reaction a/w contrast administration in the emergency department   Medicines ordered and prescription drug management: Meds ordered this encounter  Medications   adenosine  (ADENOCARD ) 6 MG/2ML injection 6 mg   adenosine  (ADENOCARD ) 6 MG/2ML injection    Long, Linda G: cabinet override   HYDROcodone  bit-homatropine (HYCODAN) 5-1.5 MG/5ML syrup 5 mL    Refill:  0   sodium chloride  0.9 % bolus 250 mL   iohexol  (OMNIPAQUE ) 350 MG/ML injection 75 mL   azithromycin  (ZITHROMAX ) 250 MG tablet    Sig: Take 1 tablet (250 mg total) by mouth daily. Take first 2 tablets together, then 1 every day until finished.    Dispense:  6 tablet    Refill:  0    -I have reviewed the patients home medicines and have made adjustments as needed   Consultations Obtained: na   Cardiac Monitoring: The patient was maintained on a cardiac monitor.  I personally viewed and interpreted the cardiac monitored which showed an underlying rhythm of: SVT > sinus tachy Continuous pulse oximetry interpreted by myself, 98% on RA.    Social Determinants of Health:  Diagnosis or treatment significantly limited by social determinants of health: former smoker   Reevaluation: After the interventions noted above, I reevaluated the patient and found that they have improved  Co morbidities that complicate the patient evaluation  Past Medical History:  Diagnosis Date   Cardiomyopathy (HCC)    CKD    Cocaine  use    ESRD (end stage renal disease) on dialysis (HCC) 12/05/2022   Hypertension    Tobacco abuse       Dispostion: Disposition decision including need for hospitalization was considered, and patient discharged from emergency department.    Final Clinical Impression(s) / ED Diagnoses Final diagnoses:  SVT (supraventricular tachycardia)        Elnor Jayson LABOR, DO 06/06/24  9243

## 2024-06-04 NOTE — ED Triage Notes (Signed)
 Pt presents with SOB, discharged earlier this afternoon for same

## 2024-06-05 ENCOUNTER — Other Ambulatory Visit: Payer: Self-pay

## 2024-06-05 ENCOUNTER — Inpatient Hospital Stay (HOSPITAL_COMMUNITY): Admission: RE | Admit: 2024-06-05 | Source: Ambulatory Visit

## 2024-06-05 DIAGNOSIS — Z992 Dependence on renal dialysis: Secondary | ICD-10-CM

## 2024-06-05 DIAGNOSIS — I5023 Acute on chronic systolic (congestive) heart failure: Secondary | ICD-10-CM | POA: Diagnosis not present

## 2024-06-05 DIAGNOSIS — J9601 Acute respiratory failure with hypoxia: Secondary | ICD-10-CM | POA: Diagnosis not present

## 2024-06-05 DIAGNOSIS — N186 End stage renal disease: Secondary | ICD-10-CM | POA: Diagnosis not present

## 2024-06-05 DIAGNOSIS — E875 Hyperkalemia: Secondary | ICD-10-CM | POA: Diagnosis not present

## 2024-06-05 DIAGNOSIS — I10 Essential (primary) hypertension: Secondary | ICD-10-CM | POA: Insufficient documentation

## 2024-06-05 LAB — CBC
HCT: 23.8 % — ABNORMAL LOW (ref 39.0–52.0)
Hemoglobin: 7.8 g/dL — ABNORMAL LOW (ref 13.0–17.0)
MCH: 31.5 pg (ref 26.0–34.0)
MCHC: 32.8 g/dL (ref 30.0–36.0)
MCV: 96 fL (ref 80.0–100.0)
Platelets: 262 K/uL (ref 150–400)
RBC: 2.48 MIL/uL — ABNORMAL LOW (ref 4.22–5.81)
RDW: 14.9 % (ref 11.5–15.5)
WBC: 4.2 K/uL (ref 4.0–10.5)
nRBC: 0 % (ref 0.0–0.2)

## 2024-06-05 LAB — BASIC METABOLIC PANEL WITH GFR
Anion gap: 14 (ref 5–15)
BUN: 66 mg/dL — ABNORMAL HIGH (ref 8–23)
CO2: 26 mmol/L (ref 22–32)
Calcium: 7.8 mg/dL — ABNORMAL LOW (ref 8.9–10.3)
Chloride: 97 mmol/L — ABNORMAL LOW (ref 98–111)
Creatinine, Ser: 13.1 mg/dL — ABNORMAL HIGH (ref 0.61–1.24)
GFR, Estimated: 4 mL/min — ABNORMAL LOW (ref >60)
Glucose, Bld: 89 mg/dL (ref 70–99)
Potassium: 5.7 mmol/L — ABNORMAL HIGH (ref 3.5–5.1)
Sodium: 137 mmol/L (ref 135–145)

## 2024-06-05 LAB — RENAL FUNCTION PANEL
Albumin: 3 g/dL — ABNORMAL LOW (ref 3.5–5.0)
Anion gap: 18 — ABNORMAL HIGH (ref 5–15)
BUN: 73 mg/dL — ABNORMAL HIGH (ref 8–23)
CO2: 23 mmol/L (ref 22–32)
Calcium: 7.8 mg/dL — ABNORMAL LOW (ref 8.9–10.3)
Chloride: 97 mmol/L — ABNORMAL LOW (ref 98–111)
Creatinine, Ser: 13.9 mg/dL — ABNORMAL HIGH (ref 0.61–1.24)
GFR, Estimated: 4 mL/min — ABNORMAL LOW (ref >60)
Glucose, Bld: 122 mg/dL — ABNORMAL HIGH (ref 70–99)
Phosphorus: 8 mg/dL — ABNORMAL HIGH (ref 2.5–4.6)
Potassium: 5.5 mmol/L — ABNORMAL HIGH (ref 3.5–5.1)
Sodium: 137 mmol/L (ref 135–145)

## 2024-06-05 LAB — CBC WITH DIFFERENTIAL/PLATELET
Abs Immature Granulocytes: 0.01 K/uL (ref 0.00–0.07)
Basophils Absolute: 0 K/uL (ref 0.0–0.1)
Basophils Relative: 1 %
Eosinophils Absolute: 0 K/uL (ref 0.0–0.5)
Eosinophils Relative: 0 %
HCT: 25.1 % — ABNORMAL LOW (ref 39.0–52.0)
Hemoglobin: 8.2 g/dL — ABNORMAL LOW (ref 13.0–17.0)
Immature Granulocytes: 0 %
Lymphocytes Relative: 14 %
Lymphs Abs: 0.5 K/uL — ABNORMAL LOW (ref 0.7–4.0)
MCH: 31.5 pg (ref 26.0–34.0)
MCHC: 32.7 g/dL (ref 30.0–36.0)
MCV: 96.5 fL (ref 80.0–100.0)
Monocytes Absolute: 0.4 K/uL (ref 0.1–1.0)
Monocytes Relative: 11 %
Neutro Abs: 2.8 K/uL (ref 1.7–7.7)
Neutrophils Relative %: 74 %
Platelets: 281 K/uL (ref 150–400)
RBC: 2.6 MIL/uL — ABNORMAL LOW (ref 4.22–5.81)
RDW: 15.1 % (ref 11.5–15.5)
WBC: 3.8 K/uL — ABNORMAL LOW (ref 4.0–10.5)
nRBC: 0 % (ref 0.0–0.2)

## 2024-06-05 LAB — CREATININE, SERUM
Creatinine, Ser: 8.97 mg/dL — ABNORMAL HIGH (ref 0.61–1.24)
GFR, Estimated: 6 mL/min — ABNORMAL LOW (ref >60)

## 2024-06-05 LAB — PRO BRAIN NATRIURETIC PEPTIDE: Pro Brain Natriuretic Peptide: >35000 pg/mL — ABNORMAL HIGH (ref <300.0)

## 2024-06-05 LAB — HEPATITIS B SURFACE ANTIGEN: Hepatitis B Surface Ag: REACTIVE — AB

## 2024-06-05 MED ORDER — ONDANSETRON HCL 4 MG/2ML IJ SOLN
4.0000 mg | Freq: Once | INTRAMUSCULAR | Status: AC
  Administered 2024-06-05: 4 mg via INTRAVENOUS
  Filled 2024-06-05: qty 2

## 2024-06-05 MED ORDER — ALTEPLASE 2 MG IJ SOLR
2.0000 mg | Freq: Once | INTRAMUSCULAR | Status: DC | PRN
Start: 2024-06-05 — End: 2024-06-05

## 2024-06-05 MED ORDER — SODIUM CHLORIDE 0.9 % IV SOLN: 250.0000 mL | INTRAVENOUS | Status: DC | PRN

## 2024-06-05 MED ORDER — METOPROLOL TARTRATE 25 MG PO TABS
12.5000 mg | ORAL_TABLET | Freq: Two times a day (BID) | ORAL | Status: DC
  Administered 2024-06-05 – 2024-06-06 (×2): 12.5 mg via ORAL
  Filled 2024-06-05 (×2): qty 1

## 2024-06-05 MED ORDER — FUROSEMIDE 40 MG PO TABS: 40.0000 mg | ORAL_TABLET | ORAL | Status: DC

## 2024-06-05 MED ORDER — CHLORHEXIDINE GLUCONATE CLOTH 2 % EX PADS
6.0000 | MEDICATED_PAD | Freq: Every day | CUTANEOUS | Status: DC
  Administered 2024-06-05 – 2024-06-06 (×2): 6 via TOPICAL

## 2024-06-05 MED ORDER — CALCIUM GLUCONATE 10 % IV SOLN
1.0000 g | Freq: Once | INTRAVENOUS | Status: AC
  Administered 2024-06-05: 1 g via INTRAVENOUS
  Filled 2024-06-05: qty 10

## 2024-06-05 MED ORDER — CALCIUM ACETATE (PHOS BINDER) 667 MG PO CAPS
667.0000 mg | ORAL_CAPSULE | Freq: Three times a day (TID) | ORAL | Status: DC
  Administered 2024-06-06 (×2): 667 mg via ORAL
  Filled 2024-06-05 (×2): qty 1

## 2024-06-05 MED ORDER — HEPARIN SODIUM (PORCINE) 1000 UNIT/ML IJ SOLN
INTRAMUSCULAR | Status: AC
Start: 2024-06-05 — End: 2024-06-05
  Filled 2024-06-05: qty 2

## 2024-06-05 MED ORDER — LIDOCAINE HCL (PF) 1 % IJ SOLN: 5.0000 mL | INTRAMUSCULAR | Status: DC | PRN

## 2024-06-05 MED ORDER — PENTAFLUOROPROP-TETRAFLUOROETH EX AERO: 1.0000 | INHALATION_SPRAY | CUTANEOUS | Status: DC | PRN

## 2024-06-05 MED ORDER — HEPARIN SODIUM (PORCINE) 5000 UNIT/ML IJ SOLN
5000.0000 [IU] | Freq: Three times a day (TID) | INTRAMUSCULAR | Status: DC
  Administered 2024-06-05 – 2024-06-06 (×3): 5000 [IU] via SUBCUTANEOUS
  Filled 2024-06-05 (×3): qty 1

## 2024-06-05 MED ORDER — ONDANSETRON HCL 4 MG/2ML IJ SOLN
4.0000 mg | Freq: Four times a day (QID) | INTRAMUSCULAR | Status: DC | PRN
Start: 2024-06-05 — End: 2024-06-06

## 2024-06-05 MED ORDER — SODIUM CHLORIDE 0.9% FLUSH
3.0000 mL | Freq: Two times a day (BID) | INTRAVENOUS | Status: DC
  Administered 2024-06-05 – 2024-06-06 (×2): 3 mL via INTRAVENOUS

## 2024-06-05 MED ORDER — HEPARIN SODIUM (PORCINE) 1000 UNIT/ML DIALYSIS
20.0000 [IU]/kg | INTRAMUSCULAR | Status: DC | PRN
Start: 2024-06-05 — End: 2024-06-05

## 2024-06-05 MED ORDER — HEPARIN SODIUM (PORCINE) 1000 UNIT/ML DIALYSIS
1000.0000 [IU] | INTRAMUSCULAR | Status: DC | PRN
Start: 2024-06-05 — End: 2024-06-05

## 2024-06-05 MED ORDER — ISOSORBIDE MONONITRATE 10 MG PO TABS
10.0000 mg | ORAL_TABLET | Freq: Every day | ORAL | Status: DC
  Administered 2024-06-05: 10 mg via ORAL
  Filled 2024-06-05 (×3): qty 1

## 2024-06-05 MED ORDER — ACETAMINOPHEN 325 MG PO TABS
650.0000 mg | ORAL_TABLET | ORAL | Status: DC | PRN
Start: 2024-06-05 — End: 2024-06-06

## 2024-06-05 MED ORDER — SODIUM CHLORIDE 0.9% FLUSH: 3.0000 mL | INTRAVENOUS | Status: DC | PRN

## 2024-06-05 MED ORDER — LIDOCAINE-PRILOCAINE 2.5-2.5 % EX CREA: 1.0000 | TOPICAL_CREAM | CUTANEOUS | Status: DC | PRN

## 2024-06-05 MED ORDER — NEPRO/CARBSTEADY PO LIQD
237.0000 mL | Freq: Two times a day (BID) | ORAL | Status: DC
  Administered 2024-06-06: 237 mL via ORAL

## 2024-06-05 MED ORDER — ANTICOAGULANT SODIUM CITRATE 4% (200MG/5ML) IV SOLN: 5.0000 mL | Status: DC | PRN

## 2024-06-05 MED ORDER — SODIUM ZIRCONIUM CYCLOSILICATE 5 G PO PACK
10.0000 g | PACK | Freq: Once | ORAL | Status: AC
  Administered 2024-06-05: 10 g via ORAL
  Filled 2024-06-05: qty 2

## 2024-06-05 NOTE — Consult Note (Signed)
 ESRD Consult Note  Requesting provider: Lamar Shan Service requesting consult: ER Reason for consult: ESRD, provision of dialysis Indication for acute dialysis?: End Stage Renal Disease  Outpatient dialysis unit: [facility]  Assessment/Recommendations: Damon Williams is a/an 63 y.o. male with a past medical history notable for ESRD on HD admitted with volume overload.   # ESRD: Will plan on HD today off schedule for volume excess. If symptoms improved after HD he is okay to DC and plan for HD outpatient tomorrow.  # Volume/ hypertension: Some volume excess. Aggressive UF as tolerated today.  Can continue home blood pressure medications if he is admitted.  Consider higher dose of diuretics at  # Anemia of Chronic Kidney Disease: Hemoglobin 8.2.  Unclear outpatient regimen.  If the patient remains in the hospital could consider ESA  # Secondary Hyperparathyroidism/Hyperphosphatemia: Continue home binders.  Obtain outpatient records if the patient remains in the hospital  # Vascular access: AVF with no issues  # Hyperkalemia: Associated with ESRD.  Dialysis as above   # Additional recommendations: - Dose all meds for creatinine clearance < 10 ml/min  - Unless absolutely necessary, no MRIs with gadolinium.  - Implement save arm precautions.  Prefer needle sticks in the dorsum of the hands or wrists.  No blood pressure measurements in arm. - If blood transfusion is requested during hemodialysis sessions, please alert us  prior to the session.  - Use synthetic opioids (Fentanyl /Dilaudid) if needed  Recommendations were discussed with the primary team.   History of Present Illness: Damon Williams is a/an 63 y.o. male with a past medical history of ESRD who presents with shortness of breath  Patient last received dialysis on Tuesday.  Says that dialysis was cut short because of fast heart rates.  He has had worsening shortness of breath for several days prior to arrival here  in the hospital.  Because of worsening shortness of breath he decided to come get evaluated.  He also noted some abdominal tightness.  Feels like he can get fluid there at times.  He denies any fevers, chills, chest pain, nausea, vomiting, diarrhea.   Medications:  Current Facility-Administered Medications  Medication Dose Route Frequency Provider Last Rate Last Admin   Chlorhexidine  Gluconate Cloth 2 % PADS 6 each  6 each Topical Q0600 Dennise Hoes, MD       Current Outpatient Medications  Medication Sig Dispense Refill   acetaminophen  (TYLENOL ) 500 MG tablet Take 1,000 mg by mouth 2 (two) times daily as needed for moderate pain (pain score 4-6), fever or headache.     amLODipine  (NORVASC ) 5 MG tablet Take 5 mg by mouth daily.     azithromycin  (ZITHROMAX ) 250 MG tablet Take 1 tablet (250 mg total) by mouth daily. Take first 2 tablets together, then 1 every day until finished. 6 tablet 0   calcium  acetate (PHOSLO ) 667 MG capsule Take 667 mg by mouth 3 (three) times daily with meals.     carvedilol  (COREG ) 3.125 MG tablet Take 1 tablet (3.125 mg total) by mouth 2 (two) times daily. 60 tablet 6   furosemide  (LASIX ) 40 MG tablet Take 1 tablet (40 mg total) by mouth See admin instructions. Take 40 mg 4 times a week on non-dialysis days PRN for swelling,SOB, weight gain 30 tablet 5   hydrALAZINE  (APRESOLINE ) 25 MG tablet Take 1 tablet (25 mg total) by mouth 3 (three) times daily. 90 tablet 3   isosorbide  mononitrate (ISMO ) 10 MG tablet Take 10 mg by  mouth daily.     losartan (COZAAR) 50 MG tablet Take 50 mg by mouth daily.       ALLERGIES Patient has no known allergies.  MEDICAL HISTORY Past Medical History:  Diagnosis Date   Cardiomyopathy [facility])    CKD    Cocaine  use    ESRD (end stage renal disease) on dialysis (HCC) 12/05/2022   Hypertension    Tobacco abuse      SOCIAL HISTORY Social History   Socioeconomic History   Marital status: Single    Spouse name: Not on file   Number  of children: Not on file   Years of education: Not on file   Highest education level: Not on file  Occupational History   Not on file  Tobacco Use   Smoking status: Former    Current packs/day: 0.00    Average packs/day: 1 pack/day for 30.0 years (30.0 ttl pk-yrs)    Types: Cigarettes    Quit date: 09/2023    Years since quitting: 0.7   Smokeless tobacco: Former    Quit date: 09/2022  Vaping Use   Vaping status: Never Used  Substance and Sexual Activity   Alcohol use: Not Currently    Comment: quit 1989   Drug use: Yes    Types: Cocaine     Comment: Pt states- he has been clean for 3 months as of 02/28/2024.   Sexual activity: Yes    Birth control/protection: None  Other Topics Concern   Not on file  Social History Narrative   Not on file   Social Drivers of Health   Financial Resource Strain: Not on file  Food Insecurity: No Food Insecurity (07/25/2023)   Hunger Vital Sign    Worried About Running Out of Food in the Last Year: Never true    Ran Out of Food in the Last Year: Never true  Transportation Needs: No Transportation Needs (07/25/2023)   PRAPARE - Administrator, Civil Service (Medical): No    Lack of Transportation (Non-Medical): No  Physical Activity: Not on file  Stress: Not on file  Social Connections: Not on file  Intimate Partner Violence: Not At Risk (07/25/2023)   Humiliation, Afraid, Rape, and Kick questionnaire    Fear of Current or Ex-Partner: No    Emotionally Abused: No    Physically Abused: No    Sexually Abused: No     FAMILY HISTORY Family History  Problem Relation Age of Onset   Heart disease Neg Hx      Review of Systems: 12 systems were reviewed and negative except per HPI  Physical Exam: Vitals:   06/05/24 0648 06/05/24 0700  BP:  (!) 130/92  Pulse: 91 95  Resp: (!) 26 (!) 21  Temp:    SpO2: 91% 91%   No intake/output data recorded. No intake or output data in the 24 hours ending 06/05/24 0856 General:  well-appearing, no acute distress HEENT: anicteric sclera, MMM CV: normal rate, no murmurs, no edema Lungs: bilateral chest rise, normal wob, minimal crackles at the bases Abd: soft, non-tender, non-distended Skin: no visible lesions or rashes Psych: alert, engaged, appropriate mood and affect Neuro: normal speech, no gross focal deficits   Test Results Reviewed Lab Results  Component Value Date   NA 137 06/04/2024   K 5.7 (H) 06/04/2024   CL 97 (L) 06/04/2024   CO2 26 06/04/2024   BUN 66 (H) 06/04/2024   CREATININE 13.10 (H) 06/04/2024   CALCIUM  7.8 (L) 06/04/2024  ALBUMIN 3.7 05/28/2024   PHOS 12.9 (H) 07/26/2023    I have reviewed relevant outside healthcare records

## 2024-06-05 NOTE — ED Notes (Signed)
 Pt on 2L of O2 and dropped back down to 85%. Pt bumped up to 4 L w/ not much improvement. Pt now on 6L w/ sats of 92%

## 2024-06-05 NOTE — ED Provider Notes (Signed)
  Physical Exam  BP 124/77   Pulse 91   Temp 98.2 F (36.8 C)   Resp (!) 26   Ht 5' 8 (1.727 m)   Wt 69.4 kg   SpO2 91%   BMI 23.26 kg/m   Physical Exam  Procedures  Procedures  ED Course / MDM   Clinical Course as of 06/05/24 1714  Wed Jun 05, 2024  0017 Initial Evaluation:  Patient presents with difficulty breathing and a history of incomplete dialysis due to rapid heart rate. Plan:  Administer Lasix  intravenously to promote diuresis Perform chest x-ray to assess for pulmonary edema Monitor response to Lasix  and respiratory status Consider admission for dialysis if inadequate response to Lasix  [JM]  0018 Initial Evaluation:  Patient presents with difficulty breathing and a history of incomplete dialysis due to rapid heart rate.  The differential diagnosis includes fluid overload due to incomplete dialysis, congestive heart failure exacerbation, pulmonary edema, renal failure, and possible electrolyte imbalance.  Plan:  Administer Lasix  intravenously to promote diuresis Perform chest x-ray to assess for pulmonary edema Monitor response to Lasix  and respiratory status Consider admission for dialysis if inadequate response to Lasix  [JM]  0107 Potassium(!): 5.7 [JM]  0107 BUN(!): 66 [JM]  0107 Creatinine(!): 13.10 Will likely need dialysis prior to d/c since he isn't scheduled again until Thursday (which is thanksgiving, not sure if they are open) [JM]  0108 Hemoglobin(!): 8.2 [JM]  0108 WBC(!): 3.8 [JM]  0108 Pro Brain Natriuretic Peptide(!): >35,000.0 Hard to interpret in the setting of dialysis and only urinates daily [JM]  0108 Ecg, calcium  gluconate, lokelma  ordered, lasix  ordered previously [JM]  0153 Started vomiting. Zofran  ordered. Just clear mucous.  [JM]  0700 Assumed care from Dr Lorette. 63 yo M with hx of ESRD on iHD who presented with SVT yesterday then came back. Had CTA yesterday that was negative. CXR now shows pulmonary edema. Now in NSR but has a  2L oxygen requirement. Going to get HD and be re-evaluated.  [RP]  1419 Pt has returned from iHD. Able to be weaned to RA.  [RP]  1420 Hemoglobin(!): 7.8 Baseline of 8.5 [RP]  1507 Patient had been placed back on 2 L nasal cannula.  Discussed with Dr. Evonnie for admission for persistent hypoxia. [RP]    Clinical Course User Index [JM] Mesner, Selinda, MD [RP] Yolande Lamar BROCKS, MD   Medical Decision Making Amount and/or Complexity of Data Reviewed Labs: ordered. Decision-making details documented in ED Course. Radiology: ordered. ECG/medicine tests: ordered.  Risk Prescription drug management. Decision regarding hospitalization.      Yolande Lamar BROCKS, MD 06/05/24 [phone]

## 2024-06-05 NOTE — Hospital Course (Signed)
 63 year old male with a history of ESRD (TTS), polysubstance abuse including alcohol, cocaine , tobacco all in remission, SVT, pulmonary hypertension, and hypertensive cardiomyopathy presenting with tachycardia.  The patient was at his usual dialysis on 06/04/2024.  Apparently he only received 1-1/2 hours because he developed SVT.  He came to the emergency department.  The patient was given adenosine  6 mg with conversion to sinus rhythm.  He was discharged home.  Unfortunately, the patient came back to the emergency department on the evening of 06/04/2024 with shortness of breath.  The patient was fluid overloaded with chest x-ray showed pulmonary edema.  Nephrology was consulted.  The patient received dialysis on 06/05/2024.  He signed himself off of dialysis after 2 hours and 40 minutes with removal of 2.2 L.  The patient continued to have an oxygen requirement with some mild dyspnea.  As result, admission was requested for further fluid removal.  He denies any headache, chest pain, palpitations, nausea, vomiting, diarrhea, abdominal pain.  He denies any hematochezia or melena.  He denies any fevers or chills. In the ED, the patient was afebrile and hemodynamically stable with oxygen saturation 91-95% 2 L.  WBC 4.2, hemoglobin 7.8, platelets 262.  Sodium 137, potassium 5.5, bicarbonate 23, serum creatinine 13.90.  COVID-19 PCR is negative.  EKG initially showed SVT with heart rate of 170.  CTA chest negative for PE but showed mild to moderate pulmonary edema.  There was a small right and trace left pleural effusion.

## 2024-06-05 NOTE — Progress Notes (Signed)
 Informed of ESRD patient in ER. Discussed with Dr. Lorette. Pt p/w SOB. Had SVT yesterday therefore did not have a full HD treatment. K 5.7, evidence of pulm edema on CXR. Dialyzes at [facility].  Plan: -HD ordered for today -has already received lokelma  -will be seen later this AM, will obtain outpatient orders -please call with q's/concerns in the interim  Ephriam Stank, MD [facility]

## 2024-06-05 NOTE — Plan of Care (Signed)
  Problem: Education: Goal: Knowledge of General Education information will improve Description: Including pain rating scale, medication(s)/side effects and non-pharmacologic comfort measures Outcome: Progressing   Problem: Coping: Goal: Level of anxiety will decrease Outcome: Progressing   Problem: Nutrition: Goal: Adequate nutrition will be maintained Outcome: Progressing   Problem: Activity: Goal: Risk for activity intolerance will decrease Outcome: Progressing   Problem: Clinical Measurements: Goal: Respiratory complications will improve Outcome: Progressing

## 2024-06-05 NOTE — H&P (Signed)
 History and Physical    Patient: Damon Williams FMW:[id number] DOB: July 20, 1960 DOA: 06/04/2024 DOS: the patient was seen and examined on 06/05/2024 PCP: Orpha Yancey LABOR, MD  Patient coming from: Home  Chief Complaint:  Chief Complaint  Patient presents with   Shortness of Breath   HPI: Damon Williams is a 63 year old male with a history of ESRD (TTS), polysubstance abuse including alcohol, cocaine , tobacco all in remission, SVT, pulmonary hypertension, and hypertensive cardiomyopathy presenting with tachycardia.  The patient was at his usual dialysis on 06/04/2024.  Apparently he only received 1-1/2 hours because he developed SVT.  He came to the emergency department.  The patient was given adenosine  6 mg with conversion to sinus rhythm.  He was discharged home.  Unfortunately, the patient came back to the emergency department on the evening of 06/04/2024 with shortness of breath.  The patient was fluid overloaded with chest x-ray showed pulmonary edema.  Nephrology was consulted.  The patient received dialysis on 06/05/2024.  He signed himself off of dialysis after 2 hours and 40 minutes with removal of 2.2 L.  The patient continued to have an oxygen requirement with some mild dyspnea.  As result, admission was requested for further fluid removal.  He denies any headache, chest pain, palpitations, nausea, vomiting, diarrhea, abdominal pain.  He denies any hematochezia or melena.  He denies any fevers or chills. In the ED, the patient was afebrile and hemodynamically stable with oxygen saturation 91-95% 2 L.  WBC 4.2, hemoglobin 7.8, platelets 262.  Sodium 137, potassium 5.5, bicarbonate 23, serum creatinine 13.90.  COVID-19 PCR is negative.  EKG initially showed SVT with heart rate of 170.  CTA chest negative for PE but showed mild to moderate pulmonary edema.  There was a small right and trace left pleural effusion.  Review of Systems: As mentioned in the history of present illness. All other  systems reviewed and are negative. Past Medical History:  Diagnosis Date   Cardiomyopathy [facility])    CKD    Cocaine  use    ESRD (end stage renal disease) on dialysis (HCC) 12/05/2022   Hypertension    Tobacco abuse    Past Surgical History:  Procedure Laterality Date   AV FISTULA PLACEMENT Left 12/27/2022   Procedure: INSERTION OF LEFT ARM  ARTERIOVENOUS FISTULA;  Surgeon: Oris Krystal FALCON, MD;  Location: [facility] ORS;  Service: Vascular;  Laterality: Left;   BACK SURGERY     COLONOSCOPY N/A 03/01/2024   Procedure: COLONOSCOPY;  Surgeon: Eartha Angelia Sieving, MD;  Location: [facility] ENDO SUITE;  Service: Gastroenterology;  Laterality: N/A;  10:15 am, asa 3  dialysis on T,Th & Sat   IR FLUORO GUIDE CV LINE RIGHT  12/07/2022   IR US  GUIDE VASC ACCESS RIGHT  12/07/2022   Social History:  reports that he quit smoking about 8 months ago. His smoking use included cigarettes. He has a 30 pack-year smoking history. He quit smokeless tobacco use about 20 months ago. He reports that he does not currently use alcohol. He reports current drug use. Drug: Cocaine .  No Known Allergies  Family History  Problem Relation Age of Onset   Heart disease Neg Hx     Prior to Admission medications   Medication Sig Start Date End Date Taking? Authorizing Provider  acetaminophen  (TYLENOL ) 500 MG tablet Take 1,000 mg by mouth 2 (two) times daily as needed for moderate pain (pain score 4-6), fever or headache.    [provider]  amLODipine  (NORVASC )  5 MG tablet Take 5 mg by mouth daily. 07/20/23   [provider]  azithromycin  (ZITHROMAX ) 250 MG tablet Take 1 tablet (250 mg total) by mouth daily. Take first 2 tablets together, then 1 every day until finished. 06/04/24   Elnor Jayson LABOR, DO  calcium  acetate (PHOSLO ) 667 MG capsule Take 667 mg by mouth 3 (three) times daily with meals. 07/20/23   [provider]  carvedilol  (COREG ) 3.125 MG tablet Take 1 tablet (3.125 mg total) by mouth 2 (two) times  daily. 06/03/24   Miriam Norris, NP  furosemide  (LASIX ) 40 MG tablet Take 1 tablet (40 mg total) by mouth See admin instructions. Take 40 mg 4 times a week on non-dialysis days PRN for swelling,SOB, weight gain 03/15/23   Miriam Norris, NP  hydrALAZINE  (APRESOLINE ) 25 MG tablet Take 1 tablet (25 mg total) by mouth 3 (three) times daily. 02/07/23   Pearlean Manus, MD  isosorbide  mononitrate (ISMO ) 10 MG tablet Take 10 mg by mouth daily. 01/29/24   [provider]  losartan (COZAAR) 50 MG tablet Take 50 mg by mouth daily. 02/07/24   [provider]    Physical Exam: Vitals:   06/05/24 1305 06/05/24 1315 06/05/24 1415 06/05/24 1419  BP: (!) 139/95 (!) 130/99  134/75  Pulse: 95 95  95  Resp: (!) 31 (!) 23  18  Temp:  98.6 F (37 C)  98.4 F (36.9 C)  TempSrc:    Oral  SpO2: 99% 95% (!) 74% 95%  Weight:      Height:       GENERAL:  A&O x 3, NAD, well developed, cooperative, follows commands HEENT: [place]/AT, No thrush, No icterus, No oral ulcers Neck:  No neck mass, No meningismus, soft, supple CV: RRR, no S3, no S4, no rub, no JVD Lungs: Bilateral crackles but no wheezing Abd: soft/NT +BS, nondistended Ext: No edema, no lymphangitis, no cyanosis, no rashes Neuro:  CN II-XII intact, strength 4/5 in RUE, RLE, strength 4/5 LUE, LLE; sensation intact bilateral; no dysmetria; babinski equivocal  Data Reviewed: Data reviewed above in the history  Assessment and Plan: Fluid overload/acute on chronic HFrEF - Patient received dialysis on 06/05/2024, but he took himself off after 2 hours and 40 minutes. -He remains fluid overloaded. - Nephrology consulted for continued dialysis - 04/24/2024 echo EF 30-35%, G2DD. +WMA, mild to moderate MR/TR  Acute respiratory failure with hypoxia - Secondary to pulmonary edema - 06/04/2024 CTA chest as discussed above--negative PE - Stable on 2 L  ESRD - Patient dialyzes on Tuesday, Thursday, Saturday -- Patient received dialysis on  06/05/2024, but he took himself off after 2 hours and 40 minutes.  2.2 L removed - Case discussed with nephrology, Dr. Macel  SVT - Patient was given adenosine  6 mg in the ED - Currently in sinus rhythm - Start metoprolol   Elevated troponin - Secondary to demand ischemia - No chest pain presently - Not consistent with ACS  Essential hypertension - Start low-dose metoprolol  as discussed above  Pericardial effusion - Small pericardial effusion noted most recent TTE 04/24/2024 - No red flag symptoms presently  Anemia of CKD - ESA per nephrology - No signs of active blood loss presently  Secondary Hyperparathyroidism/Hyperphosphatemia: Continue home binders.    Hyperkalemia -due to ESRD -received HD 06/05/24    Advance Care Planning: FULL  Consults: renal  Family Communication: none presenet  Severity of Illness: The appropriate patient status for this patient is OBSERVATION. Observation status is judged  to be reasonable and necessary in order to provide the required intensity of service to ensure the patient's safety. The patient's presenting symptoms, physical exam findings, and initial radiographic and laboratory data in the context of their medical condition is felt to place them at decreased risk for further clinical deterioration. Furthermore, it is anticipated that the patient will be medically stable for discharge from the hospital within 2 midnights of admission.   Author: Alm Schneider, MD 06/05/2024 3:28 PM  For on call review [email or web address].

## 2024-06-05 NOTE — ED Provider Notes (Signed)
 Emergency Department Provider Note  TRIAGE NOTE: Pt presents with SOB, discharged earlier this afternoon for same  HISTORY  Chief Complaint Shortness of Breath   HPI Damon Williams is a 63 y.o. male with  shortness of breath and abdominal tightness. The symptoms began after an incomplete dialysis session earlier today, which was interrupted due to a rapid heart rate of 178 bpm. The patient reports not having taken Lasix  today and has minimal urine output, typically urinating once a day even with Lasix . The patient denies fever and reports no productive cough. The patient receives dialysis on Tuesdays, Thursdays, and Saturdays and did not miss the last session. The patient has a history of heart issues and is on a daily dose of 40 mg furosemide . History was obtained from the patient.  PMH Past Medical History:  Diagnosis Date   Cardiomyopathy [facility])    CKD    Cocaine  use    ESRD (end stage renal disease) on dialysis (HCC) 12/05/2022   Hypertension    Tobacco abuse     Home Medications Prior to Admission medications   Medication Sig Start Date End Date Taking? Authorizing Provider  acetaminophen  (TYLENOL ) 500 MG tablet Take 1,000 mg by mouth 2 (two) times daily as needed for moderate pain (pain score 4-6), fever or headache.    [provider]  amLODipine  (NORVASC ) 5 MG tablet Take 5 mg by mouth daily. 07/20/23   [provider]  azithromycin  (ZITHROMAX ) 250 MG tablet Take 1 tablet (250 mg total) by mouth daily. Take first 2 tablets together, then 1 every day until finished. 06/04/24   Elnor Jayson LABOR, DO  calcium  acetate (PHOSLO ) 667 MG capsule Take 667 mg by mouth 3 (three) times daily with meals. 07/20/23   [provider]  carvedilol  (COREG ) 3.125 MG tablet Take 1 tablet (3.125 mg total) by mouth 2 (two) times daily. 06/03/24   Miriam Norris, NP  furosemide  (LASIX ) 40 MG tablet Take 1 tablet (40 mg total) by mouth See admin instructions. Take 40 mg 4  times a week on non-dialysis days PRN for swelling,SOB, weight gain 03/15/23   Miriam Norris, NP  hydrALAZINE  (APRESOLINE ) 25 MG tablet Take 1 tablet (25 mg total) by mouth 3 (three) times daily. 02/07/23   Pearlean Manus, MD  isosorbide  mononitrate (ISMO ) 10 MG tablet Take 10 mg by mouth daily. 01/29/24   [provider]  losartan (COZAAR) 50 MG tablet Take 50 mg by mouth daily. 02/07/24   [provider]    Social History Social History   Tobacco Use   Smoking status: Former    Current packs/day: 0.00    Average packs/day: 1 pack/day for 30.0 years (30.0 ttl pk-yrs)    Types: Cigarettes    Quit date: 09/2023    Years since quitting: 0.7   Smokeless tobacco: Former    Quit date: 09/2022  Vaping Use   Vaping status: Never Used  Substance Use Topics   Alcohol use: Not Currently    Comment: quit 1989   Drug use: Yes    Types: Cocaine     Comment: Pt states- he has been clean for 3 months as of 02/28/2024.    Review of Systems: Documented in HPI ____________________________________________  PHYSICAL EXAM: VITAL SIGNS: Triage: Blood pressure (!) 134/96, pulse 99, temperature 98.2 F (36.8 C), temperature source Oral, resp. rate (!) 23, height 5' 8 (1.727 m), weight 69.4 kg, SpO2 92%.  Vitals:   06/05/24 0300 06/05/24 0500 06/05/24 9386 06/05/24  0613  BP: (!) 139/96 124/77    Pulse: (!) 101 92 97   Resp: (!) 28 (!) 23 (!) 23   Temp:      TempSrc:      SpO2: 94% 96% 97% 97%  Weight:      Height:        Physical Exam Vitals and nursing note reviewed.  Constitutional:      Appearance: He is well-developed.  HENT:     Head: Normocephalic and atraumatic.  Cardiovascular:     Rate and Rhythm: Normal rate.  Pulmonary:     Effort: Pulmonary effort is normal. No respiratory distress.     Breath sounds: Decreased breath sounds, wheezing and rales present.  Abdominal:     General: There is no distension.  Musculoskeletal:        General: Normal range of  motion.     Cervical back: Normal range of motion.  Neurological:     Mental Status: He is alert.       ____________________________________________   LABS (all labs ordered are listed, but only abnormal results are displayed)  Labs Reviewed  BASIC METABOLIC PANEL WITH GFR - Abnormal; Notable for the following components:      Result Value   Potassium 5.7 (*)    Chloride 97 (*)    BUN 66 (*)    Creatinine, Ser 13.10 (*)    Calcium  7.8 (*)    GFR, Estimated 4 (*)    All other components within normal limits  CBC WITH DIFFERENTIAL/PLATELET - Abnormal; Notable for the following components:   WBC 3.8 (*)    RBC 2.60 (*)    Hemoglobin 8.2 (*)    HCT 25.1 (*)    Lymphs Abs 0.5 (*)    All other components within normal limits  PRO BRAIN NATRIURETIC PEPTIDE - Abnormal; Notable for the following components:   Pro Brain Natriuretic Peptide >35,000.0 (*)    All other components within normal limits  CBC WITH DIFFERENTIAL/PLATELET  HEPATITIS B SURFACE ANTIGEN  HEPATITIS B SURFACE ANTIBODY, QUANTITATIVE   ____________________________________________  EKG   EKG Interpretation Date/Time:    Ventricular Rate:    PR Interval:    QRS Duration:    QT Interval:    QTC Calculation:   R Axis:      Text Interpretation:          ____________________________________________  RADIOLOGY  DG Chest 2 View Result Date: 06/04/2024 EXAM: 2 VIEW(S) XRAY OF THE CHEST 06/04/2024 11:32:00 PM COMPARISON: Comparison with 06/04/2024. CLINICAL HISTORY: eval for dyspnea, shortness of breath. FINDINGS: LUNGS AND PLEURA: Bilateral perihilar infiltrates likely indicating edema. Similar appearance to previous study. No pleural effusion or pneumothorax. HEART AND MEDIASTINUM: Cardiac enlargement. Mediastinal contours appear intact. BONES AND SOFT TISSUES: No acute osseous abnormality. IMPRESSION: 1. Bilateral perihilar infiltrates, likely edema, similar to prior study. 2. Cardiac enlargement.  Electronically signed by: Elsie Gravely MD 06/04/2024 11:36 PM EST RP Workstation: [id number]   CT Angio Chest PE W and/or Wo Contrast Result Date: 06/04/2024 CLINICAL DATA:  Pulmonary embolism (PE) suspected, high prob, tachycardia after dialysis. EXAM: CT ANGIOGRAPHY CHEST WITH CONTRAST TECHNIQUE: Multidetector CT imaging of the chest was performed using the standard protocol during bolus administration of intravenous contrast. Multiplanar CT image reconstructions and MIPs were obtained to evaluate the vascular anatomy. RADIATION DOSE REDUCTION: This exam was performed according to the departmental dose-optimization program which includes automated exposure control, adjustment of the mA and/or kV according to patient size and/or use  of iterative reconstruction technique. CONTRAST:  75mL OMNIPAQUE  IOHEXOL  350 MG/ML SOLN COMPARISON:  June 04, 2024, December 16, 2023 FINDINGS: Pulmonary Embolism: No pulmonary embolism. Cardiovascular: Moderate global cardiomegaly, unchanged. Small pericardial effusion.No aortic aneurysm. Mediastinum/Nodes: No mediastinal mass.Subcentimeter multistation mediastinal, bilateral hilar, and bilateral axillary lymph nodes, likely reactive. Lungs/Pleura: The midline trachea and bronchi are patent. Redemonstrated centrilobular emphysema. Diffuse intralobular septal thickening with hazy ground-glass opacities throughout the lungs. Small right and trace left pleural effusions. No pneumothorax. Musculoskeletal: No acute fracture or destructive bone lesion. Upper Abdomen: No acute abnormality in the partially visualized upper abdomen. Review of the MIP images confirms the above findings. IMPRESSION: 1. No pulmonary embolism. 2. Moderate cardiomegaly with findings of mild-to-moderate pulmonary edema. Small right and trace left pleural effusions. Electronically Signed   By: Rogelia Myers M.D.   On: 06/04/2024 12:02   DG Chest Portable 1 View Result Date: 06/04/2024 CLINICAL DATA:   Cough.  Tachycardia. EXAM: PORTABLE CHEST 1 VIEW COMPARISON:  02/22/2024 FINDINGS: The cardio pericardial silhouette is enlarged. Hazy ground-glass opacity in both lungs has a parahilar distribution suggesting edema. No substantial pleural effusion. No acute bony abnormality. Telemetry leads overlie the chest. IMPRESSION: Enlargement of the cardiopericardial silhouette with central hazy ground-glass opacity in both lungs suggesting edema. Electronically Signed   By: Camellia Candle M.D.   On: 06/04/2024 08:32   ____________________________________________  PROCEDURES  Procedure(s) performed:   .Critical Care  Performed by: Lorette Mayo, MD Authorized by: Lorette Mayo, MD   Critical care provider statement:    Critical care time (minutes):  30   Critical care was necessary to treat or prevent imminent or life-threatening deterioration of the following conditions:  Respiratory failure   Critical care was time spent personally by me on the following activities:  Development of treatment plan with patient or surrogate, discussions with consultants, evaluation of patient's response to treatment, examination of patient, ordering and review of laboratory studies, ordering and review of radiographic studies, ordering and performing treatments and interventions, pulse oximetry, re-evaluation of patient's condition and review of old charts  ____________________________________________  INITIAL IMPRESSION / ASSESSMENT AND PLAN   Clinical Course as of 06/05/24 0641  Wed Jun 05, 2024  0017 Initial Evaluation:  Patient presents with difficulty breathing and a history of incomplete dialysis due to rapid heart rate. Plan:  Administer Lasix  intravenously to promote diuresis Perform chest x-ray to assess for pulmonary edema Monitor response to Lasix  and respiratory status Consider admission for dialysis if inadequate response to Lasix  [JM]  0018 Initial Evaluation:  Patient presents with difficulty  breathing and a history of incomplete dialysis due to rapid heart rate.  The differential diagnosis includes fluid overload due to incomplete dialysis, congestive heart failure exacerbation, pulmonary edema, renal failure, and possible electrolyte imbalance.  Plan:  Administer Lasix  intravenously to promote diuresis Perform chest x-ray to assess for pulmonary edema Monitor response to Lasix  and respiratory status Consider admission for dialysis if inadequate response to Lasix  [JM]  0107 Potassium(!): 5.7 [JM]  0107 BUN(!): 66 [JM]  0107 Creatinine(!): 13.10 Will likely need dialysis prior to d/c since he isn't scheduled again until Thursday (which is thanksgiving, not sure if they are open) [JM]  0108 Hemoglobin(!): 8.2 [JM]  0108 WBC(!): 3.8 [JM]  0108 Pro Brain Natriuretic Peptide(!): >35,000.0 Hard to interpret in the setting of dialysis and only urinates daily [JM]  0108 Ecg, calcium  gluconate, lokelma  ordered, lasix  ordered previously [JM]  0153 Started vomiting. Zofran  ordered. Just clear mucous.  [JM]  Clinical Course User Index [JM] Maciah Feeback, Selinda, MD     Images ordered viewed and obtained by myself. Agree with Radiology interpretation. Details in ED course.  Labs ordered reviewed by myself as detailed in ED course.  Consultations obtained/considered detailed in ED course.    CRITICAL INTERVENTIONS:  Calcium  Oxygen Urgent dialysis for pulmonary edema  D/w Dr. Dennise with nephrology, will dialyze this AM. Patient can likely return to ED and be discharged at this time as I suspect this is from medication noncompliance in combination with the episode of SVT he had earlier in the day along with only getting partial dialysis in the AM.    FINAL IMPRESSION Final diagnoses:  Acute respiratory failure with hypoxia (HCC)  Acute pulmonary edema (HCC)  Hyperkalemia     Care transferred pending reevaluation after dialysis for ultimate disposition.     Lorette Selinda,  MD 06/05/24 [id number]

## 2024-06-05 NOTE — ED Notes (Signed)
 Pt provided diet coke and graham crackers. Pt has no other complaints.

## 2024-06-05 NOTE — ED Notes (Signed)
 Pt vomiting- Dr Lorette at bedside

## 2024-06-05 NOTE — ED Notes (Signed)
Pt ambulatory to bathroom and back to room 

## 2024-06-05 NOTE — Plan of Care (Signed)

## 2024-06-05 NOTE — Progress Notes (Signed)
 Pt receives out-pt HD at [facility], TTS, 0600 chair time. Have notified clinic of pt arrival. No further support needed.   Lavanda Colby Catanese Dialysis Navigator [phone]

## 2024-06-05 NOTE — Procedures (Signed)
 Received patient in bed to unit.  Alert and oriented.  Informed consent signed and in chart.   Pt transferred to unit, left AVF accessed and tx started with no issues. Pt initially stated he would not do 4 hrs. Tx time and UF goal reduced to 3hrs and 3L to remove.   Pt slept through tx until close to the end, when he requested to be removed due to cramping. Nurse attempted to relieve cramp and administered 200mls of fluid to pt. Pt still requested to be removed knowing he hadnt made it to 3 hrs.  Pt tx ended early and cramps were relived.   TX duration: 2 hrs 40 mins  Patient completed tx.  Transported back to the room  Alert, without acute distress.  Hand-off given to patient's nurse.   Access used: Left AVF Access issues: None  Total UF removed: 2.2 L Medication(s) given: None Post HD weight: In stretcher Post HD VS: 130/99   Damon Williams [facility]

## 2024-06-06 DIAGNOSIS — J9601 Acute respiratory failure with hypoxia: Secondary | ICD-10-CM | POA: Diagnosis not present

## 2024-06-06 DIAGNOSIS — N186 End stage renal disease: Secondary | ICD-10-CM | POA: Diagnosis not present

## 2024-06-06 DIAGNOSIS — I5023 Acute on chronic systolic (congestive) heart failure: Secondary | ICD-10-CM | POA: Diagnosis not present

## 2024-06-06 DIAGNOSIS — E875 Hyperkalemia: Secondary | ICD-10-CM | POA: Diagnosis not present

## 2024-06-06 LAB — BASIC METABOLIC PANEL WITH GFR
Anion gap: 13 (ref 5–15)
BUN: 54 mg/dL — ABNORMAL HIGH (ref 8–23)
CO2: 30 mmol/L (ref 22–32)
Calcium: 7.9 mg/dL — ABNORMAL LOW (ref 8.9–10.3)
Chloride: 97 mmol/L — ABNORMAL LOW (ref 98–111)
Creatinine, Ser: 10.3 mg/dL — ABNORMAL HIGH (ref 0.61–1.24)
GFR, Estimated: 5 mL/min — ABNORMAL LOW (ref >60)
Glucose, Bld: 83 mg/dL (ref 70–99)
Potassium: 4.8 mmol/L (ref 3.5–5.1)
Sodium: 139 mmol/L (ref 135–145)

## 2024-06-06 LAB — HIV ANTIBODY (ROUTINE TESTING W REFLEX): HIV Screen 4th Generation wRfx: NONREACTIVE

## 2024-06-06 LAB — HEPATITIS B SURFACE ANTIBODY, QUANTITATIVE: Hep B S AB Quant (Post): <3.5 m[IU]/mL — ABNORMAL LOW

## 2024-06-06 MED ORDER — BENZONATATE 100 MG PO CAPS
100.0000 mg | ORAL_CAPSULE | Freq: Three times a day (TID) | ORAL | Status: DC
  Administered 2024-06-06: 100 mg via ORAL
  Filled 2024-06-06: qty 1

## 2024-06-06 MED ORDER — METOPROLOL SUCCINATE ER 25 MG PO TB24
12.5000 mg | ORAL_TABLET | Freq: Once | ORAL | Status: AC
  Administered 2024-06-06: 12.5 mg via ORAL
  Filled 2024-06-06: qty 1

## 2024-06-06 MED ORDER — METOPROLOL SUCCINATE ER 25 MG PO TB24: 25.0000 mg | ORAL_TABLET | Freq: Every day | ORAL | 2 refills | Status: AC

## 2024-06-06 MED ORDER — BENZONATATE 100 MG PO CAPS: 100.0000 mg | ORAL_CAPSULE | Freq: Three times a day (TID) | ORAL | 0 refills | Status: DC

## 2024-06-06 MED ORDER — METOPROLOL SUCCINATE ER 25 MG PO TB24: 25.0000 mg | ORAL_TABLET | Freq: Every day | ORAL | Status: DC

## 2024-06-06 NOTE — Discharge Summary (Signed)
 Physician Discharge Summary   Patient: Damon Williams MRN: [id number] DOB: [birth year]  Admit date:     06/04/2024  Discharge date: 06/06/24  Discharge Physician: Alm Azahel Belcastro   PCP: Orpha Yancey LABOR, MD   Recommendations at discharge:   Please follow up with primary care provider within 1-2 weeks  Please repeat BMP and CBC in one week     Hospital Course: 63 year old male with a history of ESRD (TTS), polysubstance abuse including alcohol, cocaine , tobacco all in remission, SVT, pulmonary hypertension, and hypertensive cardiomyopathy presenting with tachycardia.  The patient was at his usual dialysis on 06/04/2024.  Apparently he only received 1-1/2 hours because he developed SVT.  He came to the emergency department.  The patient was given adenosine  6 mg with conversion to sinus rhythm.  He was discharged home.  Unfortunately, the patient came back to the emergency department on the evening of 06/04/2024 with shortness of breath.  The patient was fluid overloaded with chest x-ray showed pulmonary edema.  Nephrology was consulted.  The patient received dialysis on 06/05/2024.  He signed himself off of dialysis after 2 hours and 40 minutes with removal of 2.2 L.  The patient continued to have an oxygen requirement with some mild dyspnea.  As result, admission was requested for further fluid removal.  He denies any headache, chest pain, palpitations, nausea, vomiting, diarrhea, abdominal pain.  He denies any hematochezia or melena.  He denies any fevers or chills. In the ED, the patient was afebrile and hemodynamically stable with oxygen saturation 91-95% 2 L.  WBC 4.2, hemoglobin 7.8, platelets 262.  Sodium 137, potassium 5.5, bicarbonate 23, serum creatinine 13.90.  COVID-19 PCR is negative.  EKG initially showed SVT with heart rate of 170.  CTA chest negative for PE but showed mild to moderate pulmonary edema.  There was a small right and trace left pleural effusion.  Assessment and Plan:  Fluid  overload/acute on chronic HFrEF - Patient received dialysis on 06/05/2024, but he took himself off after 2 hours and 40 minutes. - dyspnea improved - Nephrology consulted for continued dialysis - 04/24/2024 echo EF 30-35%, G2DD. +WMA, mild to moderate MR/TR   Acute respiratory failure with hypoxia - Secondary to pulmonary edema - 06/04/2024 CTA chest as discussed above--negative PE - initially required on 2 L>>weaned to RA - 11/27 ambulated hallway on RA--did NOT desaturate <96% - 11/27--discussed with renal, Dr. Gordy Patel>>ok to d/c home and go to usual outpt dialysis as scheduled   ESRD - Patient dialyzes on Tuesday, Thursday, Saturday -- Patient received dialysis on 06/05/2024, but he took himself off after 2 hours and 40 minutes.  2.2 L removed - Case discussed with nephrology, Dr. Macel - 11/27--discussed with renal, Dr. Gordy Patel>>ok to d/c home and go to usual outpt dialysis as scheduled    SVT - Patient was given adenosine  6 mg in the ED - Currently in sinus rhythm - Started metoprolol  succinate - no further SVT - d/c coreg    Elevated troponin - Secondary to demand ischemia - No chest pain presently - Not consistent with ACS   Essential hypertension - Start low-dose metoprolol  as discussed above - d/c amlodipine  and losartan - BP remains well controlled   Pericardial effusion - Small pericardial effusion noted most recent TTE 04/24/2024 - No red flag symptoms presently   Anemia of CKD - ESA per nephrology - No signs of active blood loss presently   Secondary Hyperparathyroidism/Hyperphosphatemia: Continue home binders.     Hyperkalemia -  due to ESRD -received HD 06/05/24>>improved      Consultants: renal Procedures performed: none  Disposition: Home Diet recommendation:  Renal diet DISCHARGE MEDICATION: Allergies as of 06/06/2024   No Known Allergies      Medication List     STOP taking these medications    amLODipine  5 MG  tablet Commonly known as: NORVASC    azithromycin  250 MG tablet Commonly known as: ZITHROMAX    carvedilol  3.125 MG tablet Commonly known as: COREG    hydrALAZINE  25 MG tablet Commonly known as: APRESOLINE    losartan 50 MG tablet Commonly known as: COZAAR       TAKE these medications    acetaminophen  500 MG tablet Commonly known as: TYLENOL  Take 1,000 mg by mouth 2 (two) times daily as needed for moderate pain (pain score 4-6), fever or headache.   benzonatate  100 MG capsule Commonly known as: TESSALON  Take 1 capsule (100 mg total) by mouth 3 (three) times daily.   calcium  acetate 667 MG capsule Commonly known as: PHOSLO  Take 667 mg by mouth 3 (three) times daily with meals.   furosemide  40 MG tablet Commonly known as: LASIX  Take 1 tablet (40 mg total) by mouth See admin instructions. Take 40 mg 4 times a week on non-dialysis days PRN for swelling,SOB, weight gain   isosorbide  mononitrate 10 MG tablet Commonly known as: ISMO  Take 10 mg by mouth daily.   metoprolol  succinate 25 MG 24 hr tablet Commonly known as: TOPROL -XL Take 1 tablet (25 mg total) by mouth daily. Start taking on: June 07, 2024        Discharge Exam: Fredricka Weights   06/04/24 2205 06/05/24 1605 06/06/24 0444  Weight: 69.4 kg 69.3 kg 69.8 kg   HEENT:  [place]/AT, No thrush, no icterus CV:  RRR, no rub, no S3, no S4 Lung:  CTA, no wheeze, no rhonchi Abd:  soft/+BS, NT Ext:  No edema, no lymphangitis, no synovitis, no rash   Condition at discharge: stable  The results of significant diagnostics from this hospitalization (including imaging, microbiology, ancillary and laboratory) are listed below for reference.   Imaging Studies: DG Chest 2 View Result Date: 06/04/2024 EXAM: 2 VIEW(S) XRAY OF THE CHEST 06/04/2024 11:32:00 PM COMPARISON: Comparison with 06/04/2024. CLINICAL HISTORY: eval for dyspnea, shortness of breath. FINDINGS: LUNGS AND PLEURA: Bilateral perihilar infiltrates likely  indicating edema. Similar appearance to previous study. No pleural effusion or pneumothorax. HEART AND MEDIASTINUM: Cardiac enlargement. Mediastinal contours appear intact. BONES AND SOFT TISSUES: No acute osseous abnormality. IMPRESSION: 1. Bilateral perihilar infiltrates, likely edema, similar to prior study. 2. Cardiac enlargement. Electronically signed by: Elsie Gravely MD 06/04/2024 11:36 PM EST RP Workstation: [id number]   CT Angio Chest PE W and/or Wo Contrast Result Date: 06/04/2024 CLINICAL DATA:  Pulmonary embolism (PE) suspected, high prob, tachycardia after dialysis. EXAM: CT ANGIOGRAPHY CHEST WITH CONTRAST TECHNIQUE: Multidetector CT imaging of the chest was performed using the standard protocol during bolus administration of intravenous contrast. Multiplanar CT image reconstructions and MIPs were obtained to evaluate the vascular anatomy. RADIATION DOSE REDUCTION: This exam was performed according to the departmental dose-optimization program which includes automated exposure control, adjustment of the mA and/or kV according to patient size and/or use of iterative reconstruction technique. CONTRAST:  75mL OMNIPAQUE  IOHEXOL  350 MG/ML SOLN COMPARISON:  June 04, 2024, December 16, 2023 FINDINGS: Pulmonary Embolism: No pulmonary embolism. Cardiovascular: Moderate global cardiomegaly, unchanged. Small pericardial effusion.No aortic aneurysm. Mediastinum/Nodes: No mediastinal mass.Subcentimeter multistation mediastinal, bilateral hilar, and bilateral axillary lymph nodes, likely  reactive. Lungs/Pleura: The midline trachea and bronchi are patent. Redemonstrated centrilobular emphysema. Diffuse intralobular septal thickening with hazy ground-glass opacities throughout the lungs. Small right and trace left pleural effusions. No pneumothorax. Musculoskeletal: No acute fracture or destructive bone lesion. Upper Abdomen: No acute abnormality in the partially visualized upper abdomen. Review of the MIP images  confirms the above findings. IMPRESSION: 1. No pulmonary embolism. 2. Moderate cardiomegaly with findings of mild-to-moderate pulmonary edema. Small right and trace left pleural effusions. Electronically Signed   By: Rogelia Myers M.D.   On: 06/04/2024 12:02   DG Chest Portable 1 View Result Date: 06/04/2024 CLINICAL DATA:  Cough.  Tachycardia. EXAM: PORTABLE CHEST 1 VIEW COMPARISON:  02/22/2024 FINDINGS: The cardio pericardial silhouette is enlarged. Hazy ground-glass opacity in both lungs has a parahilar distribution suggesting edema. No substantial pleural effusion. No acute bony abnormality. Telemetry leads overlie the chest. IMPRESSION: Enlargement of the cardiopericardial silhouette with central hazy ground-glass opacity in both lungs suggesting edema. Electronically Signed   By: Camellia Candle M.D.   On: 06/04/2024 08:32   LONG TERM MONITOR-LIVE TELEMETRY (3-14 DAYS) Result Date: 05/30/2024   7 day monitor   Rare supraventricular ectopy in the form of isolated PACs, couplets, triplets. Two runs of SVT longest 8 beats.   Rare ventricular ectopy in the form of isolated PVCs, couplets, triplets. Two runs of NSVT longest 14 beats   No symptoms reported Patch Wear Time:  7 days and 12 hours (2025-10-02T18:19:54-0400 to 2025-10-10T07:19:08-398) Patient had a min HR of 74 bpm, max HR of 152 bpm, and avg HR of 96 bpm. Predominant underlying rhythm was Sinus Rhythm. 2 Ventricular Tachycardia runs occurred, the run with the fastest interval lasting 4 beats with a max rate of 148 bpm, the longest lasting 14 beats with an avg rate of 128 bpm. 2 Supraventricular Tachycardia runs occurred, the run with the fastest interval lasting 8 beats with a max rate of 152 bpm (avg 136 bpm); the run with the fastest interval was also the longest. Isolated SVEs were rare (<1.0%), SVE Couplets were rare (<1.0%), and SVE Triplets were rare (<1.0%). Isolated VEs were rare (<1.0%, 246), VE Couplets were rare (<1.0%, 23), and VE  Triplets were rare (<1.0%, 5).    Microbiology: Results for orders placed or performed during the hospital encounter of 06/04/24  Resp panel by RT-PCR (RSV, Flu A&B, Covid) Anterior Nasal Swab     Status: None   Collection Time: 06/04/24  7:52 AM   Specimen: Anterior Nasal Swab  Result Value Ref Range Status   SARS Coronavirus 2 by RT PCR NEGATIVE NEGATIVE Final    Comment: (NOTE) SARS-CoV-2 target nucleic acids are NOT DETECTED.  The SARS-CoV-2 RNA is generally detectable in upper respiratory specimens during the acute phase of infection. The lowest concentration of SARS-CoV-2 viral copies this assay can detect is 138 copies/mL. A negative result does not preclude SARS-Cov-2 infection and should not be used as the sole basis for treatment or other patient management decisions. A negative result may occur with  improper specimen collection/handling, submission of specimen other than nasopharyngeal swab, presence of viral mutation(s) within the areas targeted by this assay, and inadequate number of viral copies(<138 copies/mL). A negative result must be combined with clinical observations, patient history, and epidemiological information. The expected result is Negative.  Fact Sheet for Patients:  bloggercourse.com  Fact Sheet for Healthcare Providers:  seriousbroker.it  This test is no t yet approved or cleared by the [place]  FDA and  has been authorized for detection and/or diagnosis of SARS-CoV-2 by FDA under an Emergency Use Authorization (EUA). This EUA will remain  in effect (meaning this test can be used) for the duration of the COVID-19 declaration under Section 564(b)(1) of the Act, 21 U.S.C.section 360bbb-3(b)(1), unless the authorization is terminated  or revoked sooner.       Influenza A by PCR NEGATIVE NEGATIVE Final   Influenza B by PCR NEGATIVE NEGATIVE Final    Comment: (NOTE) The Xpert Xpress  SARS-CoV-2/FLU/RSV plus assay is intended as an aid in the diagnosis of influenza from Nasopharyngeal swab specimens and should not be used as a sole basis for treatment. Nasal washings and aspirates are unacceptable for Xpert Xpress SARS-CoV-2/FLU/RSV testing.  Fact Sheet for Patients: bloggercourse.com  Fact Sheet for Healthcare Providers: seriousbroker.it  This test is not yet approved or cleared by the [place]  FDA and has been authorized for detection and/or diagnosis of SARS-CoV-2 by FDA under an Emergency Use Authorization (EUA). This EUA will remain in effect (meaning this test can be used) for the duration of the COVID-19 declaration under Section 564(b)(1) of the Act, 21 U.S.C. section 360bbb-3(b)(1), unless the authorization is terminated or revoked.     Resp Syncytial Virus by PCR NEGATIVE NEGATIVE Final    Comment: (NOTE) Fact Sheet for Patients: bloggercourse.com  Fact Sheet for Healthcare Providers: seriousbroker.it  This test is not yet approved or cleared by the [place]  FDA and has been authorized for detection and/or diagnosis of SARS-CoV-2 by FDA under an Emergency Use Authorization (EUA). This EUA will remain in effect (meaning this test can be used) for the duration of the COVID-19 declaration under Section 564(b)(1) of the Act, 21 U.S.C. section 360bbb-3(b)(1), unless the authorization is terminated or revoked.  Performed at [facility], [address] [place], [place] [address]   Culture, blood (single)     Status: None (Preliminary result)   Collection Time: 06/04/24  8:21 AM   Specimen: BLOOD  Result Value Ref Range Status   Specimen Description BLOOD RIGHT ANTECUBITAL  Final   Special Requests   Final    BOTTLES DRAWN AEROBIC AND ANAEROBIC Blood Culture adequate volume   Culture   Final    NO GROWTH 2 DAYS Performed at [facility], [address] [place], [place] [address]    Report Status PENDING  Incomplete    Labs: CBC: Recent Labs  Lab 06/04/24 0821 06/04/24 2340 06/05/24 1000  WBC 4.0 3.8* 4.2  NEUTROABS 3.1 2.8  --   HGB 8.5* 8.2* 7.8*  HCT 25.9* 25.1* 23.8*  MCV 96.3 96.5 96.0  PLT 293 281 262   Basic Metabolic Panel: Recent Labs  Lab 06/04/24 0821 06/04/24 2340 06/05/24 1000 06/05/24 1859 06/06/24 0406  NA 140 137 137  --  139  K 4.3 5.7* 5.5*  --  4.8  CL 99 97* 97*  --  97*  CO2 28 26 23   --  30  GLUCOSE 77 89 122*  --  83  BUN 55* 66* 73*  --  54*  CREATININE 10.50* 13.10* 13.90* 8.97* 10.30*  CALCIUM  7.9* 7.8* 7.8*  --  7.9*  MG 2.1  --   --   --   --   PHOS  --   --  8.0*  --   --    Liver Function Tests: Recent Labs  Lab 06/05/24 1000  ALBUMIN 3.0*   CBG: No results for input(s): GLUCAP in the last 168 hours.  Discharge time spent: greater than 30 minutes.  Signed: Alm Schneider, MD [facility] 06/06/2024

## 2024-06-06 NOTE — TOC Progression Note (Signed)
   Transition of Care [facility]) - Inpatient Brief Assessment   Patient Details  Name: Damon Williams MRN: [id number] Date of Birth: November 08, 1960  Transition of Care [facility]) CM/SW Contact:    Sharlyne Stabs, RN Phone Number: 06/06/2024, 12:09 PM   Clinical Narrative:  Patient admitted in OBS for acute on chronic heart failure, brought in from HD. Today is a holiday not HD, He will get HD tomorrow and discharge home. Patient get HD TThSAT in [place] at [facility].   Transition of Care Asessment: Insurance and Status: Insurance coverage has been reviewed Patient has primary care physician: Yes Home environment has been reviewed: HOme Prior level of function:: Independent Prior/Current Home Services: No current home services Social Drivers of Health Review: SDOH reviewed no interventions necessary Readmission risk has been reviewed: Yes Transition of care needs: no transition of care needs at this time                    Expected Discharge Plan and Services                                               Social Drivers of Health (SDOH) Interventions SDOH Screenings   Food Insecurity: No Food Insecurity (06/05/2024)  Housing: Low Risk  (06/05/2024)  Transportation Needs: No Transportation Needs (06/05/2024)  Utilities: Not At Risk (06/05/2024)  Tobacco Use: Medium Risk (06/04/2024)    Readmission Risk Interventions    07/26/2023    3:06 PM 02/06/2023    7:57 AM 12/05/2022   10:54 AM  Readmission Risk Prevention Plan  Transportation Screening Complete Complete Complete  Home Care Screening   Complete  Medication Review (RN CM)   Complete  HRI or Home Care Consult  Complete   Social Work Consult for Recovery Care Planning/Counseling  Complete   Palliative Care Screening  Not Applicable   Medication Review Oceanographer) Complete Complete   PCP or Specialist appointment within 3-5 days of discharge Not Complete    HRI or Home Care Consult Complete     SW Recovery Care/Counseling Consult Complete    Palliative Care Screening Not Applicable    Skilled Nursing Facility Not Applicable

## 2024-06-06 NOTE — Care Management Obs Status (Signed)
 MEDICARE OBSERVATION STATUS NOTIFICATION   Patient Details  Name: Damon Williams MRN: [id number] Date of Birth: 01/15/1961   Medicare Observation Status Notification Given:  Yes    Sharlyne Stabs, RN 06/06/2024, 10:33 AM

## 2024-06-09 LAB — CULTURE, BLOOD (SINGLE)
Culture: NO GROWTH
Special Requests: ADEQUATE

## 2024-06-10 NOTE — Progress Notes (Addendum)
 Late note entry 12/1 0850  D/c over weekend/ holiday noted. Contacted out-pt HD clinic, [facility], to inform of pt d/c. D/c summary and last neph note faxed at this time. No further support needed.   Lavanda Kamrie Fanton Dialysis Navigator [phone]

## 2024-06-15 ENCOUNTER — Other Ambulatory Visit: Payer: Self-pay

## 2024-06-15 ENCOUNTER — Emergency Department (HOSPITAL_COMMUNITY)
Admission: EM | Admit: 2024-06-15 | Discharge: 2024-06-15 | Disposition: A | Discharge status: Home / Self Care | Attending: Emergency Medicine | Admitting: Emergency Medicine

## 2024-06-15 ENCOUNTER — Encounter (HOSPITAL_COMMUNITY): Payer: Self-pay | Admitting: Emergency Medicine

## 2024-06-15 DIAGNOSIS — Z992 Dependence on renal dialysis: Secondary | ICD-10-CM | POA: Diagnosis not present

## 2024-06-15 DIAGNOSIS — Z5329 Procedure and treatment not carried out because of patient's decision for other reasons: Secondary | ICD-10-CM | POA: Insufficient documentation

## 2024-06-15 DIAGNOSIS — R899 Unspecified abnormal finding in specimens from other organs, systems and tissues: Secondary | ICD-10-CM | POA: Insufficient documentation

## 2024-06-15 DIAGNOSIS — B188 Other chronic viral hepatitis: Secondary | ICD-10-CM | POA: Diagnosis present

## 2024-06-15 DIAGNOSIS — N186 End stage renal disease: Secondary | ICD-10-CM | POA: Insufficient documentation

## 2024-06-15 DIAGNOSIS — Z205 Contact with and (suspected) exposure to viral hepatitis: Secondary | ICD-10-CM | POA: Diagnosis present

## 2024-06-15 LAB — COMPREHENSIVE METABOLIC PANEL WITH GFR
ALT: 165 U/L — ABNORMAL HIGH (ref 0–44)
AST: 55 U/L — ABNORMAL HIGH (ref 15–41)
Albumin: 3.2 g/dL — ABNORMAL LOW (ref 3.5–5.0)
Alkaline Phosphatase: 110 U/L (ref 38–126)
Anion gap: 15 (ref 5–15)
BUN: 60 mg/dL — ABNORMAL HIGH (ref 8–23)
CO2: 26 mmol/L (ref 22–32)
Calcium: 7.2 mg/dL — ABNORMAL LOW (ref 8.9–10.3)
Chloride: 100 mmol/L (ref 98–111)
Creatinine, Ser: 11.2 mg/dL — ABNORMAL HIGH (ref 0.61–1.24)
GFR, Estimated: 5 mL/min — ABNORMAL LOW (ref >60)
Glucose, Bld: 79 mg/dL (ref 70–99)
Potassium: 4.4 mmol/L (ref 3.5–5.1)
Sodium: 141 mmol/L (ref 135–145)
Total Bilirubin: 0.3 mg/dL (ref 0.0–1.2)
Total Protein: 6.1 g/dL — ABNORMAL LOW (ref 6.5–8.1)

## 2024-06-15 LAB — CBC WITH DIFFERENTIAL/PLATELET
Abs Immature Granulocytes: 0.02 K/uL (ref 0.00–0.07)
Basophils Absolute: 0 K/uL (ref 0.0–0.1)
Basophils Relative: 1 %
Eosinophils Absolute: 0.2 K/uL (ref 0.0–0.5)
Eosinophils Relative: 5 %
HCT: 24.3 % — ABNORMAL LOW (ref 39.0–52.0)
Hemoglobin: 7.8 g/dL — ABNORMAL LOW (ref 13.0–17.0)
Immature Granulocytes: 1 %
Lymphocytes Relative: 18 %
Lymphs Abs: 0.8 K/uL (ref 0.7–4.0)
MCH: 31.8 pg (ref 26.0–34.0)
MCHC: 32.1 g/dL (ref 30.0–36.0)
MCV: 99.2 fL (ref 80.0–100.0)
Monocytes Absolute: 0.5 K/uL (ref 0.1–1.0)
Monocytes Relative: 12 %
Neutro Abs: 2.8 K/uL (ref 1.7–7.7)
Neutrophils Relative %: 63 %
Platelets: 250 K/uL (ref 150–400)
RBC: 2.45 MIL/uL — ABNORMAL LOW (ref 4.22–5.81)
RDW: 16.5 % — ABNORMAL HIGH (ref 11.5–15.5)
WBC: 4.3 K/uL (ref 4.0–10.5)
nRBC: 0 % (ref 0.0–0.2)

## 2024-06-15 LAB — HEPATITIS PANEL, ACUTE
HCV Ab: NONREACTIVE
Hep A IgM: NONREACTIVE
Hep B C IgM: NONREACTIVE
Hepatitis B Surface Ag: NONREACTIVE

## 2024-06-15 NOTE — ED Provider Notes (Signed)
 [place] EMERGENCY DEPARTMENT AT [facility] Provider Note   CSN: [id number] Arrival date & time: 06/15/24  9557     Patient presents with: Labs Only   Damon Williams is a 63 y.o. male.  {Add pertinent medical, surgical, social history, OB history to HPI:616} 63 year old male end-stage renal disease on dialysis the presents to the ER secondary to a positive hepatitis test.  Patient was seen by myself on the 25th secondary to fluid overload and hyperkalemia in need of urgent dialysis.  He was admitted to the hospital.  Appears that hepatitis surface antigen was positive at that time however his antibody was negative.  No other labs were checked.  He states he was not informed of this.  Patient's been receiving dialysis since then.  Apparently received a call from his dialysis nurse last night informing him that the [place] infectious [place] informed the dialysis center of his hepatitis status.  He was told he cannot receive dialysis until it is resolved.  Patient with no new symptoms.  Patient without any risk for hepatitis B in the last 6 months aside from dialysis itself.  He is not sexually active nor does use IV drugs.        Prior to Admission medications   Medication Sig Start Date End Date Taking? Authorizing Provider  acetaminophen  (TYLENOL ) 500 MG tablet Take 1,000 mg by mouth 2 (two) times daily as needed for moderate pain (pain score 4-6), fever or headache.    [provider]  benzonatate  (TESSALON ) 100 MG capsule Take 1 capsule (100 mg total) by mouth 3 (three) times daily. 06/06/24   Evonnie Lenis, MD  calcium  acetate (PHOSLO ) 667 MG capsule Take 667 mg by mouth 3 (three) times daily with meals. 07/20/23   [provider]  furosemide  (LASIX ) 40 MG tablet Take 1 tablet (40 mg total) by mouth See admin instructions. Take 40 mg 4 times a week on non-dialysis days PRN for swelling,SOB, weight gain 03/15/23   Miriam Norris, NP  isosorbide  mononitrate  (ISMO ) 10 MG tablet Take 10 mg by mouth daily. 01/29/24   [provider]  metoprolol  succinate (TOPROL -XL) 25 MG 24 hr tablet Take 1 tablet (25 mg total) by mouth daily. 06/07/24   Evonnie Lenis, MD    Allergies: Patient has no known allergies.    Review of Systems  Updated Vital Signs BP 127/87   Pulse 85   Temp 98 F (36.7 C) (Axillary)   Resp 18   Ht 5' 7 (1.702 m)   Wt 70 kg   SpO2 97%   BMI 24.17 kg/m   Physical Exam Vitals and nursing note reviewed.  Constitutional:      Appearance: He is well-developed.  HENT:     Head: Normocephalic and atraumatic.  Cardiovascular:     Rate and Rhythm: Normal rate.  Pulmonary:     Effort: Pulmonary effort is normal. No respiratory distress.  Abdominal:     General: There is no distension.  Musculoskeletal:        General: Normal range of motion.     Cervical back: Normal range of motion.  Neurological:     Mental Status: He is alert.     (all labs ordered are listed, but only abnormal results are displayed) Labs Reviewed  CBC WITH DIFFERENTIAL/PLATELET - Abnormal; Notable for the following components:      Result Value   RBC 2.45 (*)    Hemoglobin 7.8 (*)    HCT  24.3 (*)    RDW 16.5 (*)    All other components within normal limits  COMPREHENSIVE METABOLIC PANEL WITH GFR - Abnormal; Notable for the following components:   BUN 60 (*)    Creatinine, Ser 11.20 (*)    Calcium  7.2 (*)    Total Protein 6.1 (*)    Albumin 3.2 (*)    AST 55 (*)    ALT 165 (*)    GFR, Estimated 5 (*)    All other components within normal limits  HEPATITIS B E ANTIBODY  HEPATITIS B CORE ANTIBODY, TOTAL  HEPATITIS PANEL, ACUTE    EKG: None  Radiology: No results found.  {Document cardiac monitor, telemetry assessment procedure when appropriate:32947} Procedures   Medications Ordered in the ED - No data to display    {Click here for ABCD2, HEART and other calculators REFRESH Note before signing:1}                               Medical Decision Making Amount and/or Complexity of Data Reviewed Labs: ordered.   I discussed with Alberta Hasty at the dialysis center [phone]) she is holding a spot at 11:00 for him to get dialysis.  I have spoken to the lab and they are going to rush his sample to come with a stat run time to try to get it back in time to get dialysis.  This is not done in time prior to dialysis his next possible dialysis days Tuesday if its negative.  Patient went into fluid overload last time from short dialysis and I am not sure he can wait till Tuesday to get it.  Will have to work on a plan to get dialysis here if he cannot make his 11:00 appointment.  {Document critical care time when appropriate  Document review of labs and clinical decision tools ie CHADS2VASC2, etc  Document your independent review of radiology images and any outside records  Document your discussion with family members, caretakers and with consultants  Document social determinants of health affecting pt's care  Document your decision making why or why not admission, treatments were needed:32947:::1}   Final diagnoses:  None    ED Discharge Orders     None

## 2024-06-15 NOTE — ED Triage Notes (Signed)
 Pt sent over by [facility] to have lab drawn for Hepatitis B.

## 2024-06-15 NOTE — ED Notes (Signed)
 Pt had removed all equipment and ambulated out into the hall before dc vitals could be taken. Pt requested to be dc before lab results had resulted.

## 2024-06-15 NOTE — ED Provider Notes (Signed)
 This patient without receiving all of his lab work, I have asked him to follow-up his labs on MyChart, he does not have hyperkalemia or abnormal vital signs at this time.   Cleotilde Rogue, MD 06/16/24 [phone]

## 2024-06-15 NOTE — Discharge Instructions (Signed)
 You should follow up your results -if your hepatitis panel is positive you will need to contact your kidney team You may return at anytime should you change your mind or should you become sick or ill with increasing weakness shortness of breath or any other symptoms

## 2024-06-17 ENCOUNTER — Ambulatory Visit: Payer: Self-pay | Admitting: Nurse Practitioner

## 2024-06-17 ENCOUNTER — Emergency Department (HOSPITAL_COMMUNITY)

## 2024-06-17 ENCOUNTER — Other Ambulatory Visit: Payer: Self-pay

## 2024-06-17 ENCOUNTER — Emergency Department (HOSPITAL_COMMUNITY)
Admission: EM | Admit: 2024-06-17 | Discharge: 2024-06-17 | Disposition: A | Discharge status: Home / Self Care | Attending: Emergency Medicine | Admitting: Emergency Medicine

## 2024-06-17 ENCOUNTER — Encounter (HOSPITAL_COMMUNITY): Payer: Self-pay

## 2024-06-17 DIAGNOSIS — R0600 Dyspnea, unspecified: Secondary | ICD-10-CM

## 2024-06-17 DIAGNOSIS — E8770 Fluid overload, unspecified: Secondary | ICD-10-CM

## 2024-06-17 DIAGNOSIS — N186 End stage renal disease: Secondary | ICD-10-CM

## 2024-06-17 LAB — BASIC METABOLIC PANEL WITH GFR
Anion gap: 18 — ABNORMAL HIGH (ref 5–15)
BUN: 102 mg/dL — ABNORMAL HIGH (ref 8–23)
CO2: 23 mmol/L (ref 22–32)
Calcium: 6.9 mg/dL — ABNORMAL LOW (ref 8.9–10.3)
Chloride: 99 mmol/L (ref 98–111)
Creatinine, Ser: 14.9 mg/dL — ABNORMAL HIGH (ref 0.61–1.24)
GFR, Estimated: 3 mL/min — ABNORMAL LOW (ref >60)
Glucose, Bld: 87 mg/dL (ref 70–99)
Potassium: 4.8 mmol/L (ref 3.5–5.1)
Sodium: 140 mmol/L (ref 135–145)

## 2024-06-17 LAB — CBC WITH DIFFERENTIAL/PLATELET
Abs Immature Granulocytes: 0.01 K/uL (ref 0.00–0.07)
Basophils Absolute: 0 K/uL (ref 0.0–0.1)
Basophils Relative: 1 %
Eosinophils Absolute: 0.2 K/uL (ref 0.0–0.5)
Eosinophils Relative: 5 %
HCT: 22.9 % — ABNORMAL LOW (ref 39.0–52.0)
Hemoglobin: 7.5 g/dL — ABNORMAL LOW (ref 13.0–17.0)
Immature Granulocytes: 0 %
Lymphocytes Relative: 20 %
Lymphs Abs: 0.7 K/uL (ref 0.7–4.0)
MCH: 32.1 pg (ref 26.0–34.0)
MCHC: 32.8 g/dL (ref 30.0–36.0)
MCV: 97.9 fL (ref 80.0–100.0)
Monocytes Absolute: 0.4 K/uL (ref 0.1–1.0)
Monocytes Relative: 12 %
Neutro Abs: 2.1 K/uL (ref 1.7–7.7)
Neutrophils Relative %: 62 %
Platelets: 225 K/uL (ref 150–400)
RBC: 2.34 MIL/uL — ABNORMAL LOW (ref 4.22–5.81)
RDW: 16.1 % — ABNORMAL HIGH (ref 11.5–15.5)
WBC: 3.5 K/uL — ABNORMAL LOW (ref 4.0–10.5)
nRBC: 0 % (ref 0.0–0.2)

## 2024-06-17 LAB — HEPATITIS B E ANTIBODY: Hep B E Ab: NONREACTIVE

## 2024-06-17 LAB — HEPATITIS B CORE ANTIBODY, TOTAL: HEP B CORE AB: NEGATIVE

## 2024-06-17 NOTE — ED Triage Notes (Signed)
 Pt here for sob, says [facility] would not give him dialysis until the Hep B results were back.

## 2024-06-17 NOTE — ED Provider Notes (Signed)
 [place] EMERGENCY DEPARTMENT AT [facility] Provider Note   CSN: [id number] Arrival date & time: 06/17/24  9945     Patient presents with: Shortness of Breath (Missed couple of dialysis)   Damon Williams is a 63 y.o. male.   The patient is a 63 year old male with history of end-stage renal disease on hemodialysis.  Patient presenting today with complaints of shortness of breath.  His last dialysis was 5 days ago.  He says that he had a hepatitis B test that returned positive, so [facility] refused to dialyze him.  He has since had a negative test and is scheduled to be dialyzed at 4:30 AM today.  He began feeling more short of breath, so presents for evaluation of this.  Denies any fevers or chills.  No cough.  He does report increased leg swelling.       Prior to Admission medications   Medication Sig Start Date End Date Taking? Authorizing Provider  acetaminophen  (TYLENOL ) 500 MG tablet Take 1,000 mg by mouth 2 (two) times daily as needed for moderate pain (pain score 4-6), fever or headache.    [provider]  benzonatate  (TESSALON ) 100 MG capsule Take 1 capsule (100 mg total) by mouth 3 (three) times daily. 06/06/24   Evonnie Lenis, MD  calcium  acetate (PHOSLO ) 667 MG capsule Take 667 mg by mouth 3 (three) times daily with meals. 07/20/23   [provider]  furosemide  (LASIX ) 40 MG tablet Take 1 tablet (40 mg total) by mouth See admin instructions. Take 40 mg 4 times a week on non-dialysis days PRN for swelling,SOB, weight gain 03/15/23   Miriam Norris, NP  isosorbide  mononitrate (ISMO ) 10 MG tablet Take 10 mg by mouth daily. 01/29/24   [provider]  metoprolol  succinate (TOPROL -XL) 25 MG 24 hr tablet Take 1 tablet (25 mg total) by mouth daily. 06/07/24   Evonnie Lenis, MD    Allergies: Patient has no known allergies.    Review of Systems  All other systems reviewed and are negative.   Updated Vital Signs BP 104/70   Pulse 81    Temp 98.1 F (36.7 C) (Oral)   Resp 20   Ht 5' 7 (1.702 m)   Wt 70 kg   SpO2 97%   BMI 24.17 kg/m   Physical Exam Vitals and nursing note reviewed.  Constitutional:      General: He is not in acute distress.    Appearance: He is well-developed. He is not diaphoretic.  HENT:     Head: Normocephalic and atraumatic.  Cardiovascular:     Rate and Rhythm: Normal rate and regular rhythm.     Heart sounds: No murmur heard.    No friction rub.  Pulmonary:     Effort: Pulmonary effort is normal. No respiratory distress.     Breath sounds: Examination of the right-lower field reveals rales. Examination of the left-lower field reveals wheezing and rales. Wheezing and rales present.  Abdominal:     General: Bowel sounds are normal. There is no distension.     Palpations: Abdomen is soft.     Tenderness: There is no abdominal tenderness.  Musculoskeletal:        General: Normal range of motion.     Cervical back: Normal range of motion and neck supple.     Right lower leg: Edema present.     Left lower leg: Edema present.     Comments: There is 2+ pitting edema both  lower extremities.  Skin:    General: Skin is warm and dry.  Neurological:     Mental Status: He is alert and oriented to person, place, and time.     Coordination: Coordination normal.     (all labs ordered are listed, but only abnormal results are displayed) Labs Reviewed  BASIC METABOLIC PANEL WITH GFR  CBC WITH DIFFERENTIAL/PLATELET    EKG: EKG Interpretation Date/Time:  Monday June 17 2024 01:59:13 EST Ventricular Rate:  86 PR Interval:  207 QRS Duration:  95 QT Interval:  411 QTC Calculation: 492 R Axis:   56  Text Interpretation: Sinus rhythm Probable LVH with secondary repol abnrm Borderline prolonged QT interval No significant change since 06/05/2024 Confirmed by Geroldine Berg ([id number]) on 06/17/2024 2:01:38 AM  Radiology: No results found.   Procedures   Medications Ordered in the ED - No  data to display                                  Medical Decision Making Amount and/or Complexity of Data Reviewed Labs: ordered. Radiology: ordered.   Patient is a 63 year old male with history of end-stage renal disease on hemodialysis presenting with shortness of breath.  He has not had his dialysis in 5 days due to a positive hepatitis B test.  Patient arrives here with stable vital signs and is afebrile.  He does have edema in his legs, slight rales on exam, but is in no respiratory distress.  His oxygen saturations are 99 to 100% on room air and he is in no respiratory distress.  Laboratory studies obtained including CBC and basic metabolic panel.  He has baseline anemia with a hemoglobin of 7.5 and BUN/creatinine of 102/14.9.  Potassium is normal at 4.8.  Chest x-ray shows perhaps mild pulmonary edema.  Patient presenting with shortness of breath after not having his dialysis in 5 days.  He does have some edema of the legs and volume overload on his chest x-ray, but is in no respiratory distress and oxygen saturations are 99 to 100% on room air.  I do not feel as though he requires emergent dialysis.  He does have an appointment early this morning for dialysis and I will advise him to go to this appointment.     Final diagnoses:  None    ED Discharge Orders     None          Geroldine Berg, MD 06/17/24 [phone]

## 2024-06-17 NOTE — Discharge Instructions (Signed)
 Go to your dialysis appointment this morning as scheduled.

## 2024-06-18 ENCOUNTER — Ambulatory Visit: Payer: Self-pay | Admitting: Nurse Practitioner

## 2024-07-10 ENCOUNTER — Ambulatory Visit: Admitting: Nurse Practitioner

## 2024-07-12 ENCOUNTER — Emergency Department (HOSPITAL_COMMUNITY)
Admission: EM | Admit: 2024-07-12 | Discharge: 2024-07-12 | Disposition: A | Discharge status: Home / Self Care | Source: Home / Self Care | Attending: Emergency Medicine | Admitting: Emergency Medicine

## 2024-07-12 ENCOUNTER — Other Ambulatory Visit: Payer: Self-pay

## 2024-07-12 ENCOUNTER — Encounter (HOSPITAL_COMMUNITY): Payer: Self-pay

## 2024-07-12 ENCOUNTER — Emergency Department (HOSPITAL_COMMUNITY)

## 2024-07-12 DIAGNOSIS — I120 Hypertensive chronic kidney disease with stage 5 chronic kidney disease or end stage renal disease: Secondary | ICD-10-CM | POA: Insufficient documentation

## 2024-07-12 DIAGNOSIS — R0682 Tachypnea, not elsewhere classified: Secondary | ICD-10-CM | POA: Insufficient documentation

## 2024-07-12 DIAGNOSIS — Z720 Tobacco use: Secondary | ICD-10-CM | POA: Diagnosis not present

## 2024-07-12 DIAGNOSIS — E8779 Other fluid overload: Secondary | ICD-10-CM | POA: Diagnosis not present

## 2024-07-12 DIAGNOSIS — Z992 Dependence on renal dialysis: Secondary | ICD-10-CM | POA: Insufficient documentation

## 2024-07-12 DIAGNOSIS — N186 End stage renal disease: Secondary | ICD-10-CM | POA: Insufficient documentation

## 2024-07-12 DIAGNOSIS — Z5329 Procedure and treatment not carried out because of patient's decision for other reasons: Secondary | ICD-10-CM | POA: Insufficient documentation

## 2024-07-12 DIAGNOSIS — J810 Acute pulmonary edema: Secondary | ICD-10-CM | POA: Diagnosis not present

## 2024-07-12 DIAGNOSIS — R000 Tachycardia, unspecified: Secondary | ICD-10-CM | POA: Diagnosis not present

## 2024-07-12 DIAGNOSIS — R0602 Shortness of breath: Secondary | ICD-10-CM | POA: Diagnosis present

## 2024-07-12 LAB — CBC WITH DIFFERENTIAL/PLATELET
Abs Immature Granulocytes: 0.01 K/uL (ref 0.00–0.07)
Basophils Absolute: 0.1 K/uL (ref 0.0–0.1)
Basophils Relative: 1 %
Eosinophils Absolute: 0.1 K/uL (ref 0.0–0.5)
Eosinophils Relative: 3 %
HCT: 28.3 % — ABNORMAL LOW (ref 39.0–52.0)
Hemoglobin: 9 g/dL — ABNORMAL LOW (ref 13.0–17.0)
Immature Granulocytes: 0 %
Lymphocytes Relative: 17 %
Lymphs Abs: 0.7 K/uL (ref 0.7–4.0)
MCH: 32.3 pg (ref 26.0–34.0)
MCHC: 31.8 g/dL (ref 30.0–36.0)
MCV: 101.4 fL — ABNORMAL HIGH (ref 80.0–100.0)
Monocytes Absolute: 0.4 K/uL (ref 0.1–1.0)
Monocytes Relative: 9 %
Neutro Abs: 3 K/uL (ref 1.7–7.7)
Neutrophils Relative %: 70 %
Platelets: 226 K/uL (ref 150–400)
RBC: 2.79 MIL/uL — ABNORMAL LOW (ref 4.22–5.81)
RDW: 15.9 % — ABNORMAL HIGH (ref 11.5–15.5)
WBC: 4.3 K/uL (ref 4.0–10.5)
nRBC: 0 % (ref 0.0–0.2)

## 2024-07-12 LAB — BASIC METABOLIC PANEL WITH GFR
Anion gap: 13 (ref 5–15)
BUN: 64 mg/dL — ABNORMAL HIGH (ref 8–23)
CO2: 27 mmol/L (ref 22–32)
Calcium: 7.3 mg/dL — ABNORMAL LOW (ref 8.9–10.3)
Chloride: 103 mmol/L (ref 98–111)
Creatinine, Ser: 10.5 mg/dL — ABNORMAL HIGH (ref 0.61–1.24)
GFR, Estimated: 5 mL/min — ABNORMAL LOW
Glucose, Bld: 94 mg/dL (ref 70–99)
Potassium: 4.2 mmol/L (ref 3.5–5.1)
Sodium: 143 mmol/L (ref 135–145)

## 2024-07-12 LAB — HEPATITIS B SURFACE ANTIGEN: Hepatitis B Surface Ag: NONREACTIVE

## 2024-07-12 MED ORDER — LIDOCAINE HCL (PF) 1 % IJ SOLN: 5.0000 mL | INTRAMUSCULAR | Status: DC | PRN

## 2024-07-12 MED ORDER — HEPARIN SODIUM (PORCINE) 1000 UNIT/ML IJ SOLN
INTRAMUSCULAR | Status: AC
  Filled 2024-07-12: qty 2

## 2024-07-12 MED ORDER — LIDOCAINE-PRILOCAINE 2.5-2.5 % EX CREA: 1.0000 | TOPICAL_CREAM | CUTANEOUS | Status: DC | PRN

## 2024-07-12 MED ORDER — CHLORHEXIDINE GLUCONATE CLOTH 2 % EX PADS: 6.0000 | MEDICATED_PAD | Freq: Every day | CUTANEOUS | Status: DC

## 2024-07-12 MED ORDER — PENTAFLUOROPROP-TETRAFLUOROETH EX AERO: 1.0000 | INHALATION_SPRAY | CUTANEOUS | Status: DC | PRN

## 2024-07-12 NOTE — Progress Notes (Signed)
 Pt receives out-pt HD at [facility] [place], TTS, 0600 chair time. Will assist as needed.    Lavanda Lauriann Milillo Dialysis Navigator [phone]

## 2024-07-12 NOTE — Discharge Instructions (Addendum)
 Please make sure that you go to your dialysis session tomorrow even though you had it today.  Return to the ER for severe or worsening symptoms

## 2024-07-12 NOTE — ED Notes (Signed)
"  Provider at bedside   "

## 2024-07-12 NOTE — ED Triage Notes (Signed)
 Pov from home cc of SOB. Cut his dialysis appt short yesterday because he had somewhere to be then ate greens and potato salad. (Tu Th Sat) 90% RA

## 2024-07-12 NOTE — Discharge Instructions (Signed)
 You may return at any time should you change your mind, please follow-up for your dialysis tomorrow, if things are getting worse with more shortness of breath or any other symptoms return to the ER immediately

## 2024-07-12 NOTE — ED Notes (Signed)
 Patient returns from dialysis. States that he wants to go home

## 2024-07-12 NOTE — ED Provider Notes (Signed)
 Discussed with nephrology, they will come see the patient for dialysis, anticipate being able to discharge after dialysis.  The patient presents with cutting his dialysis short yesterday with mild volume overload, oxygen 90% on room air, otherwise no complaints.  This patient is leaving AGAINST MEDICAL ADVICE and does not want to stay for dialysis   Cleotilde Rogue, MD 07/12/24 [phone]

## 2024-07-12 NOTE — ED Triage Notes (Signed)
 Pt came in pov with complaints of SOB. Pt just left [facility] today after being told that he needs dialysis today. Pt stated he would just get it tomorrow.

## 2024-07-12 NOTE — ED Provider Notes (Signed)
 " [place] EMERGENCY DEPARTMENT AT [facility] Provider Note   CSN: [id number] Arrival date & time: 07/12/24  1043     Patient presents with: Shortness of Breath   Damon Williams is a 64 y.o. male.    Shortness of Breath  This patient presents within 4 hours of being discharged AGAINST MEDICAL ADVICE.  He is a known dialysis patient who presented short of breath with pulmonary edema, he was supposed to get dialysis however he wanted to leave stating that he felt better however when he took the oxygen off and left he noticed that he was more more short of breath throughout the day.  He tried to get into his dialysis center to have them do him today but because it was not his day they could not do it.  They recommended he come back here.  The patient denies any other symptoms including chest pain or fevers    Prior to Admission medications  Medication Sig Start Date End Date Taking? Authorizing Provider  acetaminophen  (TYLENOL ) 500 MG tablet Take 1,000 mg by mouth 2 (two) times daily as needed for moderate pain (pain score 4-6), fever or headache.    [provider]  benzonatate  (TESSALON ) 100 MG capsule Take 1 capsule (100 mg total) by mouth 3 (three) times daily. 06/06/24   Evonnie Lenis, MD  calcium  acetate (PHOSLO ) 667 MG capsule Take 667 mg by mouth 3 (three) times daily with meals. 07/20/23   [provider]  furosemide  (LASIX ) 40 MG tablet Take 1 tablet (40 mg total) by mouth See admin instructions. Take 40 mg 4 times a week on non-dialysis days PRN for swelling,SOB, weight gain 03/15/23   Miriam Norris, NP  isosorbide  mononitrate (ISMO ) 10 MG tablet Take 10 mg by mouth daily. 01/29/24   [provider]  metoprolol  succinate (TOPROL -XL) 25 MG 24 hr tablet Take 1 tablet (25 mg total) by mouth daily. 06/07/24   Evonnie Lenis, MD    Allergies: Patient has no known allergies.    Review of Systems  Respiratory:  Positive for shortness of breath.   All  other systems reviewed and are negative.   Updated Vital Signs BP (!) 146/94 (BP Location: Right Arm)   Pulse (!) 101   Temp 97.6 F (36.4 C) (Oral)   Resp (!) 22   Ht 1.702 m (5' 7)   Wt 70 kg   SpO2 93%   BMI 24.17 kg/m   Physical Exam Vitals and nursing note reviewed.  Constitutional:      General: He is not in acute distress.    Appearance: He is well-developed.  HENT:     Head: Normocephalic and atraumatic.     Mouth/Throat:     Pharynx: No oropharyngeal exudate.  Eyes:     General: No scleral icterus.       Right eye: No discharge.        Left eye: No discharge.     Conjunctiva/sclera: Conjunctivae normal.     Pupils: Pupils are equal, round, and reactive to light.  Neck:     Thyroid : No thyromegaly.     Vascular: No JVD.  Cardiovascular:     Rate and Rhythm: Regular rhythm. Tachycardia present.     Heart sounds: Normal heart sounds. No murmur heard.    No friction rub. No gallop.  Pulmonary:     Effort: Respiratory distress present.     Breath sounds: Rales present. No wheezing.  Abdominal:  General: Bowel sounds are normal. There is no distension.     Palpations: Abdomen is soft. There is no mass.     Tenderness: There is no abdominal tenderness.  Musculoskeletal:        General: No tenderness. Normal range of motion.     Cervical back: Normal range of motion and neck supple.     Right lower leg: No edema.     Left lower leg: No edema.  Lymphadenopathy:     Cervical: No cervical adenopathy.  Skin:    General: Skin is warm and dry.     Findings: No erythema or rash.  Neurological:     Mental Status: He is alert.     Coordination: Coordination normal.  Psychiatric:        Behavior: Behavior normal.     (all labs ordered are listed, but only abnormal results are displayed) Labs Reviewed - No data to display  EKG: None  Radiology: [facility] Chest Port 1 View Result Date: 07/12/2024 EXAM: 1 VIEW(S) XRAY OF THE CHEST 07/12/2024 06:13:16 AM  COMPARISON: 06/17/2024 CLINICAL HISTORY: shortness of breath FINDINGS: LUNGS AND PLEURA: Mild pulmonary edema. Increased interstitial markings bilaterally. Small left pleural effusion. No pneumothorax. HEART AND MEDIASTINUM: Marked cardiomegaly, stable. BONES AND SOFT TISSUES: No acute osseous abnormality. IMPRESSION: 1. Mild pulmonary edema and small left pleural effusion. 2. Stable marked cardiomegaly. Electronically signed by: Waddell Calk MD 07/12/2024 06:20 AM EST RP Workstation: [id number]     Procedures   Medications Ordered in the ED - No data to display                                  Medical Decision Making  Patient presents with increasing shortness of breath, he has already had a workup earlier today and does not need any further labs or imaging, he has known vascular congestion and edema and likely needs to have more fluid taken off during dialysis as he did not stay for his entire session yesterday.  Will discuss with nephrology, patient does not have any signs of pulmonary embolism fevers pneumonias or any other symptoms.  He is agreeable to stay at this time.  Case discussed with Dr. Marlee who will arrange dialysis  At the time of change of shift care signed out to oncoming physician Dr. Suzette to follow-up dialysis and disposition home most likely as long as patient is better     Final diagnoses:  None    ED Discharge Orders     None          Cleotilde Rogue, MD 07/13/24 [phone]  "

## 2024-07-12 NOTE — Progress Notes (Signed)
 Pt transported to unit for tx. Pt's L AVF was accessed for tx. Pt's UF wasturned off for 30 minutes of tx and received a 500ml bolus of saline d/t severe cramps. Pt continually c/o cramps and wanted to stop tx so UF was reduced to keep patient on.  Pt left tx with no s/s of distress.  07/12/24 1904  Vitals  Pulse Rate 92  Resp (!) 21  BP (!) 147/91  SpO2 94 %  O2 Device Nasal Cannula  Oxygen Therapy  O2 Flow Rate (L/min) 3 L/min  Post Treatment  Dialyzer Clearance Clear  Hemodialysis Intake (mL) 500 mL  Liters Processed 72  Fluid Removed (mL) 1500 mL  Tolerated HD Treatment No (Comment)  AVG/AVF Arterial Site Held (minutes) 5 minutes  AVG/AVF Venous Site Held (minutes) 5 minutes

## 2024-07-12 NOTE — ED Notes (Signed)
Gave report to dialysis nurse  

## 2024-07-12 NOTE — Consult Note (Signed)
 Damon Williams Admit Date: 07/12/2024 07/12/2024 Damon Williams Damon Williams Requesting Physician:   Damon Pinal MD  Reason for Consult:  ESRD, AHRF, Acute dyspnea  HPI:  78M ESRD LUE AVF [facility] last Tx 07/11/24 who presented to the ED here earlier today, left AMA, and then just returned with acute worsening dyspnea of and hypoxia.  Patient with SpO2 in the low 80s on room air.  Mild increased work of breathing.  Portable chest x-ray at presentation with mild pulmonary edema and a small left pleural effusion.  Blood pressures in the 140s over 90s.  He reports achieving his EDW yesterday at dialysis, but did cut his treatment short by 1 hour.  No issues with his AV fistula.  Other notable labs at this time include a K of 4.2, BUN 64, bicarbonate 27, hemoglobin 9.0.  No sick contacts.  No fevers or chills.  No productive cough.  Afebrile presentation.  WBC 4.3.  Says no tobacco or other recreational drugs in the past 6 to 8 months. ]  ROS Balance of 12 systems is negative w/ exceptions as above  PMH  Past Medical History:  Diagnosis Date   Cardiomyopathy (HCC)    CKD    Cocaine  use    ESRD (end stage renal disease) on dialysis (HCC) 12/05/2022   Hypertension    Tobacco abuse    PSH  Past Surgical History:  Procedure Laterality Date   AV FISTULA PLACEMENT Left 12/27/2022   Procedure: INSERTION OF LEFT ARM  ARTERIOVENOUS FISTULA;  Surgeon: Oris Krystal FALCON, MD;  Location: [facility] ORS;  Service: Vascular;  Laterality: Left;   BACK SURGERY     COLONOSCOPY N/A 03/01/2024   Procedure: COLONOSCOPY;  Surgeon: Eartha Angelia Sieving, MD;  Location: [facility] ENDO SUITE;  Service: Gastroenterology;  Laterality: N/A;  10:15 am, asa 3  dialysis on T,Th & Sat   IR FLUORO GUIDE CV LINE RIGHT  12/07/2022   IR US  GUIDE VASC ACCESS RIGHT  12/07/2022   FH  Family History  Problem Relation Age of Onset   Heart disease Neg Hx    SH  reports that he quit smoking about 10 months ago. His smoking use included  cigarettes. He has a 30 pack-year smoking history. He quit smokeless tobacco use about 22 months ago. He reports that he does not currently use alcohol. He reports current drug use. Drug: Cocaine . Allergies Allergies[1] Home medications Prior to Admission medications  Medication Sig Start Date End Date Taking? Authorizing Provider  acetaminophen  (TYLENOL ) 500 MG tablet Take 1,000 mg by mouth 2 (two) times daily as needed for moderate pain (pain score 4-6), fever or headache.    [provider]  benzonatate  (TESSALON ) 100 MG capsule Take 1 capsule (100 mg total) by mouth 3 (three) times daily. 06/06/24   Evonnie Lenis, MD  calcium  acetate (PHOSLO ) 667 MG capsule Take 667 mg by mouth 3 (three) times daily with meals. 07/20/23   [provider]  furosemide  (LASIX ) 40 MG tablet Take 1 tablet (40 mg total) by mouth See admin instructions. Take 40 mg 4 times a week on non-dialysis days PRN for swelling,SOB, weight gain 03/15/23   Miriam Norris, NP  isosorbide  mononitrate (ISMO ) 10 MG tablet Take 10 mg by mouth daily. 01/29/24   [provider]  metoprolol  succinate (TOPROL -XL) 25 MG 24 hr tablet Take 1 tablet (25 mg total) by mouth daily. 06/07/24   Evonnie Lenis, MD    Current Medications Scheduled Meds:  Chlorhexidine  Gluconate  Cloth  6 each Topical Q0600   Continuous Infusions: PRN Meds:.  CBC Recent Labs  Lab 07/12/24 0600  WBC 4.3  NEUTROABS 3.0  HGB 9.0*  HCT 28.3*  MCV 101.4*  PLT 226   Basic Metabolic Panel Recent Labs  Lab 07/12/24 0600  NA 143  K 4.2  CL 103  CO2 27  GLUCOSE 94  BUN 64*  CREATININE 10.50*  CALCIUM  7.3*    Physical Exam  Blood pressure (!) 146/94, pulse (!) 101, temperature 97.6 F (36.4 C), temperature source Oral, resp. rate (!) 22, height 5' 7 (1.702 m), weight 70 kg, SpO2 93%. GEN: Mild increased work of breathing ENT: Nasal cannula in place, NCAT CV: Regular, mild tachycardia, no rub PULM: Scattered crackles ABD: Soft,  nontender SKIN: LUE AVF +B/T EXT: No significant peripheral edema  Assessment 40M with AHRF/Acute Dyspean, ESRD on HD  Dyspnea/AHRF requiring supplemental oxygen: Chest x-ray with some evidence of edema.  No clear infectious symptoms.  Plan for HD this afternoon with goal UF of 3 L. ESRD THS [facility]  LUE AVF: Additional treatment here in the hospital today.  Will be reassessed in the ED thereafter for potential discharge.  Should go to outpatient dialysis tomorrow at discharge.  Please notify nephrology if remains inpatient so that we can evaluate for additional dialysis over the weekend. Anemia: Hemoglobin 9.0, CTM. Hypertension and mild hypervolemia: As above, trend with UF  Plan As above, dialysis second shift today Discussed with Dr. Cleotilde.  Discussed with [facility] RN.  Damon Williams Damon Williams  [phone] pgr 07/12/2024, 11:26 AM        [1] No Known Allergies

## 2024-07-12 NOTE — ED Provider Notes (Signed)
 " [place] EMERGENCY DEPARTMENT AT [facility] Provider Note   CSN: [id number] Arrival date & time: 07/12/24  9462     Patient presents with: Shortness of Breath   Damon Williams is a 64 y.o. male.   Presents to the emergency department for evaluation with shortness of breath.  Patient is a dialysis patient, receives dialysis Tuesday Thursday Saturday.  He did go to his session yesterday but cut it short because he had another appointment.  He also reports that he did not follow his diet yesterday.       Prior to Admission medications  Medication Sig Start Date End Date Taking? Authorizing Provider  acetaminophen  (TYLENOL ) 500 MG tablet Take 1,000 mg by mouth 2 (two) times daily as needed for moderate pain (pain score 4-6), fever or headache.    [provider]  benzonatate  (TESSALON ) 100 MG capsule Take 1 capsule (100 mg total) by mouth 3 (three) times daily. 06/06/24   Evonnie Lenis, MD  calcium  acetate (PHOSLO ) 667 MG capsule Take 667 mg by mouth 3 (three) times daily with meals. 07/20/23   [provider]  furosemide  (LASIX ) 40 MG tablet Take 1 tablet (40 mg total) by mouth See admin instructions. Take 40 mg 4 times a week on non-dialysis days PRN for swelling,SOB, weight gain 03/15/23   Miriam Norris, NP  isosorbide  mononitrate (ISMO ) 10 MG tablet Take 10 mg by mouth daily. 01/29/24   [provider]  metoprolol  succinate (TOPROL -XL) 25 MG 24 hr tablet Take 1 tablet (25 mg total) by mouth daily. 06/07/24   Evonnie Lenis, MD    Allergies: Patient has no known allergies.    Review of Systems  Updated Vital Signs BP (!) 145/97   Pulse (!) 102   Temp 98.4 F (36.9 C)   Resp 20   Ht 5' 7 (1.702 m)   Wt 70 kg   SpO2 93%   BMI 24.17 kg/m   Physical Exam Vitals and nursing note reviewed.  Constitutional:      General: He is not in acute distress.    Appearance: He is well-developed.  HENT:     Head: Normocephalic and atraumatic.      Mouth/Throat:     Mouth: Mucous membranes are moist.  Eyes:     General: Vision grossly intact. Gaze aligned appropriately.     Extraocular Movements: Extraocular movements intact.     Conjunctiva/sclera: Conjunctivae normal.  Cardiovascular:     Rate and Rhythm: Regular rhythm. Tachycardia present.     Pulses: Normal pulses.     Heart sounds: Normal heart sounds, S1 normal and S2 normal. No murmur heard.    No friction rub. No gallop.  Pulmonary:     Effort: Pulmonary effort is normal. Tachypnea present. No respiratory distress.     Breath sounds: Normal breath sounds.  Abdominal:     Palpations: Abdomen is soft.     Tenderness: There is no abdominal tenderness. There is no guarding or rebound.     Hernia: No hernia is present.  Musculoskeletal:        General: No swelling.     Cervical back: Full passive range of motion without pain, normal range of motion and neck supple. No pain with movement, spinous process tenderness or muscular tenderness. Normal range of motion.     Right lower leg: No edema.     Left lower leg: No edema.  Skin:    General: Skin is warm and dry.  Capillary Refill: Capillary refill takes less than 2 seconds.     Findings: No ecchymosis, erythema, lesion or wound.  Neurological:     Mental Status: He is alert and oriented to person, place, and time.     GCS: GCS eye subscore is 4. GCS verbal subscore is 5. GCS motor subscore is 6.     Cranial Nerves: Cranial nerves 2-12 are intact.     Sensory: Sensation is intact.     Motor: Motor function is intact. No weakness or abnormal muscle tone.     Coordination: Coordination is intact.  Psychiatric:        Mood and Affect: Mood normal.        Speech: Speech normal.        Behavior: Behavior normal.     (all labs ordered are listed, but only abnormal results are displayed) Labs Reviewed  CBC WITH DIFFERENTIAL/PLATELET - Abnormal; Notable for the following components:      Result Value   RBC 2.79 (*)     Hemoglobin 9.0 (*)    HCT 28.3 (*)    MCV 101.4 (*)    RDW 15.9 (*)    All other components within normal limits  BASIC METABOLIC PANEL WITH GFR - Abnormal; Notable for the following components:   BUN 64 (*)    Creatinine, Ser 10.50 (*)    Calcium  7.3 (*)    GFR, Estimated 5 (*)    All other components within normal limits    EKG: EKG Interpretation Date/Time:  Friday July 12 2024 06:05:34 EST Ventricular Rate:  103 PR Interval:  168 QRS Duration:  95 QT Interval:  384 QTC Calculation: 503 R Axis:   75  Text Interpretation: Sinus tachycardia Probable left atrial enlargement Left ventricular hypertrophy Anterior infarct, old Prolonged QT interval No significant change since last tracing Confirmed by Haze Lonni PARAS [phone]) on 07/12/2024 6:39:19 AM  Radiology: [facility] Chest Port 1 View Result Date: 07/12/2024 EXAM: 1 VIEW(S) XRAY OF THE CHEST 07/12/2024 06:13:16 AM COMPARISON: 06/17/2024 CLINICAL HISTORY: shortness of breath FINDINGS: LUNGS AND PLEURA: Mild pulmonary edema. Increased interstitial markings bilaterally. Small left pleural effusion. No pneumothorax. HEART AND MEDIASTINUM: Marked cardiomegaly, stable. BONES AND SOFT TISSUES: No acute osseous abnormality. IMPRESSION: 1. Mild pulmonary edema and small left pleural effusion. 2. Stable marked cardiomegaly. Electronically signed by: Waddell Calk MD 07/12/2024 06:20 AM EST RP Workstation: [id number]     Procedures   Medications Ordered in the ED - No data to display                                  Medical Decision Making Amount and/or Complexity of Data Reviewed Labs: ordered. Radiology: ordered.   Differential Diagnosis considered includes, but not limited to: CHF exacerbation; ACS/MI; Bronchitis/URI; Pneumonia; PE  Patient with history of end-stage renal disease with dialysis dependence.  Patient did go to dialysis yesterday but cut his session short.  He presents now with shortness of breath and mildly  diminished room air oxygen saturations of 90%.  Patient's electrolytes are unremarkable.  Chest x-ray does suggest some volume overload. Oncoming ER physician will take over care of patient - discuss with nephrology, possible additional dialysis and then discharge.     Final diagnoses:  Other hypervolemia    ED Discharge Orders     None          Jelina Paulsen, Lonni PARAS, MD 07/12/24 [phone]  "

## 2024-07-12 NOTE — ED Provider Notes (Signed)
 "  EMERGENCY DEPARTMENT AT [facility] Provider Note   CSN: [id number] Arrival date & time: 07/12/24  1043     Patient presents with: Shortness of Breath   Damon Williams is a 64 y.o. male.  Patient is 64 year old male with a history of end-stage renal disease on dialysis who presents to the ED returning from dialysis.  Patient was seen at the ED earlier today after leaving the hospital AGAINST MEDICAL ADVICE.  He stated he did not want to wait for dialysis.  Notes he is normally on dialysis Tuesday, Thursday, Saturday.  He states he only did half of his dialysis yesterday and feels like that is why he was not feeling well.  He was sent to dialysis earlier today and was sent back to the ED after dialysis.  Notes he is feeling much better and the shortness of breath has improved.  He states he does have dialysis again tomorrow morning.  No further complaints.    Shortness of Breath Associated symptoms: no chest pain and no fever        Prior to Admission medications  Medication Sig Start Date End Date Taking? Authorizing Provider  acetaminophen  (TYLENOL ) 500 MG tablet Take 1,000 mg by mouth 2 (two) times daily as needed for moderate pain (pain score 4-6), fever or headache.    [provider]  benzonatate  (TESSALON ) 100 MG capsule Take 1 capsule (100 mg total) by mouth 3 (three) times daily. 06/06/24   Evonnie Lenis, MD  calcium  acetate (PHOSLO ) 667 MG capsule Take 667 mg by mouth 3 (three) times daily with meals. 07/20/23   [provider]  furosemide  (LASIX ) 40 MG tablet Take 1 tablet (40 mg total) by mouth See admin instructions. Take 40 mg 4 times a week on non-dialysis days PRN for swelling,SOB, weight gain 03/15/23   Miriam Norris, NP  isosorbide  mononitrate (ISMO ) 10 MG tablet Take 10 mg by mouth daily. 01/29/24   [provider]  metoprolol  succinate (TOPROL -XL) 25 MG 24 hr tablet Take 1 tablet (25 mg total) by mouth daily. 06/07/24   Evonnie Lenis, MD    Allergies: Patient has no known allergies.    Review of Systems  Constitutional:  Negative for fever.  Respiratory:  Positive for shortness of breath.   Cardiovascular:  Negative for chest pain.  All other systems reviewed and are negative.   Updated Vital Signs BP 139/89   Pulse 94   Temp 98.6 F (37 C)   Resp 19   Ht 5' 7 (1.702 m)   Wt 70 kg   SpO2 90%   BMI 24.17 kg/m   Physical Exam Constitutional:      Appearance: He is well-developed.  HENT:     Head: Normocephalic and atraumatic.  Cardiovascular:     Rate and Rhythm: Normal rate.  Pulmonary:     Effort: Pulmonary effort is normal.     Breath sounds: Normal breath sounds.  Skin:    General: Skin is warm and dry.  Neurological:     Mental Status: He is alert and oriented to person, place, and time.  Psychiatric:        Mood and Affect: Mood normal.        Behavior: Behavior normal.     (all labs ordered are listed, but only abnormal results are displayed) Labs Reviewed  HEPATITIS B SURFACE ANTIGEN  HEPATITIS B SURFACE ANTIBODY, QUANTITATIVE    EKG: None  Radiology: [facility] Chest Port 1  View Result Date: 07/12/2024 EXAM: 1 VIEW(S) XRAY OF THE CHEST 07/12/2024 06:13:16 AM COMPARISON: 06/17/2024 CLINICAL HISTORY: shortness of breath FINDINGS: LUNGS AND PLEURA: Mild pulmonary edema. Increased interstitial markings bilaterally. Small left pleural effusion. No pneumothorax. HEART AND MEDIASTINUM: Marked cardiomegaly, stable. BONES AND SOFT TISSUES: No acute osseous abnormality. IMPRESSION: 1. Mild pulmonary edema and small left pleural effusion. 2. Stable marked cardiomegaly. Electronically signed by: Waddell Calk MD 07/12/2024 06:20 AM EST RP Workstation: [id number]      Medications Ordered in the ED  Chlorhexidine  Gluconate Cloth 2 % PADS 6 each (6 each Topical Not Given 07/12/24 1204)                                   Medical Decision Making Patient presents to the ED returning from  dialysis earlier today.  Of note patient had left hospital AGAINST MEDICAL ADVICE as he did not want to stay for dialysis.  He returned to the ED this morning for shortness of breath.  He had had a recent workup so no further lab work was drawn.  He was excepted by nephrology for dialysis this afternoon.  States he did complete a full course of dialysis and is scheduled again tomorrow.  He states he is feeling much better and back to his baseline.  No concerns for further emergent workup or labs this evening.  Vital signs are stable.  Stable for discharge.  Advised to follow-up for dialysis tomorrow as scheduled.  Return precautions provided.       Final diagnoses:  Acute pulmonary edema (HCC)  ESRD (end stage renal disease) [facility])  Dialysis patient    ED Discharge Orders     None          Neysa Thersia GORMAN DEVONNA 07/12/24 1956    Suzette Pac, MD 07/14/24 1713  "

## 2024-07-13 LAB — HEPATITIS B SURFACE ANTIBODY, QUANTITATIVE: Hep B S AB Quant (Post): <3.5 m[IU]/mL — ABNORMAL LOW

## 2024-07-15 NOTE — Progress Notes (Addendum)
 D/c over weekend noted. Contacted out-pt HD clinic [facility] and informed of pt d/c and anticipated arrival back to clinic. No d/c summary as pt left ama. No further support is needed.     Lavanda Trystyn Dolley Dialysis Navigator [phone]

## 2024-07-26 ENCOUNTER — Ambulatory Visit: Admitting: Internal Medicine

## 2024-07-26 NOTE — Progress Notes (Unsigned)
"  ° °  Damon Williams, male    DOB: 23-Sep-1960    MRN: [id number]   Brief patient profile:  64  yo*** *** referred to pulmonary clinic in [place]  07/26/2024 by *** for ***   Pt not previously seen by PCCM service.     History of Present Illness  07/26/2024  Pulmonary/ 1st office eval/ Damon Williams / [place] [facility]  No chief complaint on file.    Dyspnea:  *** Cough: *** Sleep: *** SABA use: *** 02: *** LDSCT:***  No obvious day to day or daytime pattern/variability or assoc excess/ purulent sputum or mucus plugs or hemoptysis or cp or chest tightness, subjective wheeze or overt sinus or hb symptoms.    Also denies any obvious fluctuation of symptoms with weather or environmental changes or other aggravating or alleviating factors except as outlined above   No unusual exposure hx or h/o childhood pna/ asthma or knowledge of premature birth.  Current Allergies, Complete Past Medical History, Past Surgical History, Family History, and Social History were reviewed in [employer] record.  ROS  The following are not active complaints unless bolded Hoarseness, sore throat, dysphagia, dental problems, itching, sneezing,  nasal congestion or discharge of excess mucus or purulent secretions, ear ache,   fever, chills, sweats, unintended wt loss or wt gain, classically pleuritic or exertional cp,  orthopnea pnd or arm/hand swelling  or leg swelling, presyncope, palpitations, abdominal pain, anorexia, nausea, vomiting, diarrhea  or change in bowel habits or change in bladder habits, change in stools or change in urine, dysuria, hematuria,  rash, arthralgias, visual complaints, headache, numbness, weakness or ataxia or problems with walking or coordination,  change in mood or  memory.            Outpatient Medications Prior to Visit  Medication Sig Dispense Refill   acetaminophen  (TYLENOL ) 500 MG tablet Take 1,000 mg by mouth 2 (two) times daily as needed for moderate pain  (pain score 4-6), fever or headache.     benzonatate  (TESSALON ) 100 MG capsule Take 1 capsule (100 mg total) by mouth 3 (three) times daily. 12 capsule 0   calcium  acetate (PHOSLO ) 667 MG capsule Take 667 mg by mouth 3 (three) times daily with meals.     furosemide  (LASIX ) 40 MG tablet Take 1 tablet (40 mg total) by mouth See admin instructions. Take 40 mg 4 times a week on non-dialysis days PRN for swelling,SOB, weight gain 30 tablet 5   isosorbide  mononitrate (ISMO ) 10 MG tablet Take 10 mg by mouth daily.     metoprolol  succinate (TOPROL -XL) 25 MG 24 hr tablet Take 1 tablet (25 mg total) by mouth daily. 30 tablet 2   No facility-administered medications prior to visit.    Past Medical History:  Diagnosis Date   Cardiomyopathy [facility])    CKD    Cocaine  use    ESRD (end stage renal disease) on dialysis (HCC) 12/05/2022   Hypertension    Tobacco abuse       Objective:     There were no vitals taken for this visit.         Assessment       "

## 2024-08-16 ENCOUNTER — Ambulatory Visit: Admitting: Internal Medicine

## 2024-08-16 ENCOUNTER — Encounter: Payer: Self-pay | Admitting: Internal Medicine

## 2024-08-16 ENCOUNTER — Emergency Department (HOSPITAL_COMMUNITY): Admission: EM | Admit: 2024-08-16 | Source: Home / Self Care

## 2024-08-16 ENCOUNTER — Other Ambulatory Visit: Payer: Self-pay

## 2024-08-16 ENCOUNTER — Encounter (HOSPITAL_COMMUNITY): Payer: Self-pay

## 2024-08-16 ENCOUNTER — Emergency Department (HOSPITAL_COMMUNITY)

## 2024-08-16 VITALS — BP 142/92 | HR 93 | Ht 67.0 in | Wt 173.0 lb

## 2024-08-16 DIAGNOSIS — J310 Chronic rhinitis: Secondary | ICD-10-CM | POA: Insufficient documentation

## 2024-08-16 DIAGNOSIS — I272 Pulmonary hypertension, unspecified: Secondary | ICD-10-CM | POA: Insufficient documentation

## 2024-08-16 DIAGNOSIS — R0609 Other forms of dyspnea: Secondary | ICD-10-CM | POA: Insufficient documentation

## 2024-08-16 NOTE — Progress Notes (Unsigned)
 "   Damon Williams, male    DOB: 18-Sep-1960    MRN: [id number]   Brief patient profile:  63  yobm  quit smoking 09/2023  referred to pulmonary clinic in [facility]  08/16/2024 by Hasanaj for doe  Pulmonary hypertension  on  HD x sev years  Pt not previously seen by PCCM service.     History of Present Illness  08/16/2024  Pulmonary/ 1st office eval/ Darlean / [place] [facility] last HD was 08/14/24 x 3/4 hours  Chief Complaint  Patient presents with   Establish Care    Shob when bending over - doe  Referral for Pulm HTN , some heart conditions   Dyspnea:  rides scooter at grocery store  x years/ mb 150 ft slt incline to MB Cough: none  Sleep: bed is flat and 3 pillows or gets sob esp pre- HD   SABA use: none 02: none  LDSCT:CTa  06/04/24   No obvious day to day or daytime pattern/variability or assoc excess/ purulent sputum or mucus plugs or hemoptysis or cp or chest tightness, subjective wheeze or overt  hb symptoms.    Also denies any obvious fluctuation of symptoms with weather or environmental changes or other aggravating or alleviating factors except as outlined above   No unusual exposure hx or h/o childhood pna/ asthma or knowledge of premature birth.  Current Allergies, Complete Past Medical History, Past Surgical History, Family History, and Social History were reviewed in [employer] record.  ROS  The following are not active complaints unless bolded Hoarseness, sore throat, dysphagia, dental problems, itching, sneezing,  nasal congestion or discharge of excess mucus or purulent secretions, ear ache,   fever, chills, sweats, unintended wt loss or wt gain, classically pleuritic or exertional cp,  orthopnea pnd or arm/hand swelling  or leg swelling improved , presyncope, palpitations, abdominal pain, anorexia, nausea, vomiting, diarrhea  or change in bowel habits(constipation)  or change in bladder habits, change in stools or change in urine, dysuria, hematuria,   rash, arthralgias, visual complaints, headache, numbness, weakness or ataxia or problems with walking or coordination,  change in mood or  memory.            Outpatient Medications Prior to Visit  Medication Sig Dispense Refill   acetaminophen  (TYLENOL ) 500 MG tablet Take 1,000 mg by mouth 2 (two) times daily as needed for moderate pain (pain score 4-6), fever or headache.     calcium  acetate (PHOSLO ) 667 MG capsule Take 667 mg by mouth 3 (three) times daily with meals.     furosemide  (LASIX ) 40 MG tablet Take 1 tablet (40 mg total) by mouth See admin instructions. Take 40 mg 4 times a week on non-dialysis days PRN for swelling,SOB, weight gain 30 tablet 5   isosorbide  mononitrate (ISMO ) 10 MG tablet Take 10 mg by mouth daily.     losartan (COZAAR) 25 MG tablet Take 25 mg by mouth daily.     metoprolol  succinate (TOPROL -XL) 25 MG 24 hr tablet Take 1 tablet (25 mg total) by mouth daily. 30 tablet 2   benzonatate  (TESSALON ) 100 MG capsule Take 1 capsule (100 mg total) by mouth 3 (three) times daily. (Patient not taking: Reported on 08/16/2024) 12 capsule 0   No facility-administered medications prior to visit.    Past Medical History:  Diagnosis Date   Cardiomyopathy (HCC)    CKD    Cocaine  use    ESRD (end stage renal disease) on dialysis (HCC)  12/05/2022   Hypertension    Tobacco abuse       Objective:     BP (!) 142/92   Pulse 93   Ht 5' 7 (1.702 m)   Wt 173 lb (78.5 kg)   SpO2 96% Comment: ra  BMI 27.10 kg/m   SpO2: 96 % (ra) amb somber bm nad    HEENT : Oropharynx  - M4 airway    Nasal turbinates mod edema s purulence or polyps    NECK :  without  apparent JVD/ palpable Nodes/TM    LUNGS: no acc muscle use, slt barrel contour chest which is clear to A and P bilaterally without cough on insp or exp maneuvers   CV:  RRR  no s3 or murmur or increase in P2, and trace ankle edema bilaterally   ABD:   Pos Hoover's sign mid insp and mod distended but - nontender    MS:  Gait nl   ext warm without deformities Or obvious joint restrictions  calf tenderness, cyanosis or clubbing    SKIN: warm and dry without lesions    NEURO:  alert, approp, nl sensorium with  no motor or cerebellar deficits apparent.       Assessment   Assessment & Plan DOE (dyspnea on exertion) Quit smoking 09/2023 -  Echo 04/24/24 EF 34% with Grad 2 diastolic dysfunction and PAS 66 with LAE = RAE = moderate - CTA  Chest 06/04/24 > No pulmonary embolism but  Moderate cardiomegaly with findings of mild-to-moderate pulmonary edema. Small right and trace left pleural effusions. 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 Assoc with watery d/c and itching   - 08/16/2024  trial of  1st gen H1 blockers per guidelines      AVS  Patient Instructions  My office will be contacting you by phone for referral for PFTs at [facility]  and overnight oxygen levels on Room Air  - if you don't hear back from my office within one week please call us  back or notify us  thru MyChart and we'll address it right away.    >>>  pulmonary follow up will be arranged after the results are back from these studies   For constipation >>> try Miralax  per bottle instruction (or generic version)   For drainage / throat tickle or  itching  >>> try take CHLORPHENIRAMINE  4 mg   take one every 4 hours as needed - extremely effective and inexpensive over the counter- may cause drowsiness so start with just a dose or two an hour before bedtime and see how you tolerate it before trying in daytime.           Ozell America, MD 08/16/2024      "

## 2024-08-16 NOTE — Assessment & Plan Note (Addendum)
 Quit smoking 09/2023 -  Echo 04/24/24 EF 34% with Grad 2 diastolic dysfunction and PAS 66 with LAE = RAE = moderate - CTA  Chest 06/04/24 > No pulmonary embolism but  Moderate cardiomegaly with findings of mild-to-moderate pulmonary edema. Small right and trace left pleural effusions. -  08/16/2024   Walked on RA  x  2  lap(s) =  approx 300  ft  @  mod  pace, stopped due to doe with lowest 02 sats 98%  - ONO on RA 08/16/2024 >>>  - PFTs  08/16/2024 >>>

## 2024-08-16 NOTE — ED Triage Notes (Signed)
 Pov from home cc of SOB since this evening. Went to lung doctor today.  Tues, Th, Saturday. Went Wednesday and was only there 3 hours. Did not go today  Labored breathing in triage

## 2024-08-16 NOTE — Assessment & Plan Note (Addendum)
 Onset ? Assoc with watery d/c and itching   - 08/16/2024  trial of  1st gen H1 blockers per guidelines

## 2024-08-16 NOTE — Assessment & Plan Note (Addendum)
 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

## 2024-08-16 NOTE — Progress Notes (Signed)
 Damon Williams   [address] [place] [address]   [phone]     Hypertensive chronic kidney disease with stage 1 through stage 4 chronic kidney disease, or unspecified chronic kidney disease No data to display  Chronic Start Date:  12/08/2022   Maintenance Dialysis History      Start End Type Center Comments   12/08/2022  In-center Hemodialysis [facility]         Current Dialysis Center Information     [facility]     Phone: [phone] Fax:    Address: [address] [place] [address]                 Orders Placed This Encounter  Procedures   Ambulatory referral to Solid Organ Transplant Team

## 2024-08-16 NOTE — Patient Instructions (Addendum)
 My office will be contacting you by phone for referral for PFTs at [facility]  and overnight oxygen levels on Room Air  - if you don't hear back from my office within one week please call us  back or notify us  thru MyChart and we'll address it right away.    >>>  pulmonary follow up will be arranged after the results are back from these studies   For constipation >>> try Miralax  per bottle instruction (or generic version)   For drainage / throat tickle or  itching  >>> try take CHLORPHENIRAMINE  4 mg   take one every 4 hours as needed - extremely effective and inexpensive over the counter- may cause drowsiness so start with just a dose or two an hour before bedtime and see how you tolerate it before trying in daytime.
